# Patient Record
Sex: Female | Born: 2000 | Race: Black or African American | Hispanic: No | Marital: Single | Smoking: Never smoker
Health system: Southern US, Community
[De-identification: ages and names within clinical notes are randomized; demographics above are authoritative.]

## PROBLEM LIST (undated history)

## (undated) ENCOUNTER — Ambulatory Visit: Admission: EM

## (undated) DIAGNOSIS — IMO0002 Reserved for concepts with insufficient information to code with codable children: Secondary | ICD-10-CM

## (undated) DIAGNOSIS — I10 Essential (primary) hypertension: Secondary | ICD-10-CM

## (undated) DIAGNOSIS — R569 Unspecified convulsions: Secondary | ICD-10-CM

## (undated) DIAGNOSIS — M329 Systemic lupus erythematosus, unspecified: Secondary | ICD-10-CM

## (undated) DIAGNOSIS — M3214 Glomerular disease in systemic lupus erythematosus: Secondary | ICD-10-CM

---
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
## 2020-10-31 DIAGNOSIS — D638 Anemia in other chronic diseases classified elsewhere: Secondary | ICD-10-CM | POA: Diagnosis present

## 2020-10-31 DIAGNOSIS — I6783 Posterior reversible encephalopathy syndrome: Secondary | ICD-10-CM | POA: Insufficient documentation

## 2020-12-09 ENCOUNTER — Inpatient Hospital Stay (HOSPITAL_COMMUNITY)
Admit: 2020-12-09 | Discharge: 2020-12-09 | Disposition: A | Discharge status: Home / Self Care | Payer: Medicaid Other | Attending: Critical Care Medicine | Admitting: Critical Care Medicine

## 2020-12-09 ENCOUNTER — Emergency Department: Payer: Medicaid Other

## 2020-12-09 ENCOUNTER — Encounter: Payer: Self-pay | Admitting: Internal Medicine

## 2020-12-09 ENCOUNTER — Inpatient Hospital Stay
Admission: EM | Admit: 2020-12-09 | Discharge: 2020-12-10 | DRG: 291 | Disposition: A | Discharge status: Other Acute Inpatient Hospital | Payer: Medicaid Other | Attending: Internal Medicine | Admitting: Internal Medicine

## 2020-12-09 DIAGNOSIS — Z7952 Long term (current) use of systemic steroids: Secondary | ICD-10-CM | POA: Diagnosis not present

## 2020-12-09 DIAGNOSIS — I420 Dilated cardiomyopathy: Secondary | ICD-10-CM | POA: Diagnosis not present

## 2020-12-09 DIAGNOSIS — C801 Malignant (primary) neoplasm, unspecified: Secondary | ICD-10-CM | POA: Diagnosis not present

## 2020-12-09 DIAGNOSIS — N183 Chronic kidney disease, stage 3 unspecified: Secondary | ICD-10-CM | POA: Diagnosis present

## 2020-12-09 DIAGNOSIS — R0603 Acute respiratory distress: Secondary | ICD-10-CM | POA: Diagnosis not present

## 2020-12-09 DIAGNOSIS — J9602 Acute respiratory failure with hypercapnia: Secondary | ICD-10-CM | POA: Diagnosis present

## 2020-12-09 DIAGNOSIS — I5021 Acute systolic (congestive) heart failure: Secondary | ICD-10-CM | POA: Diagnosis not present

## 2020-12-09 DIAGNOSIS — J9601 Acute respiratory failure with hypoxia: Secondary | ICD-10-CM | POA: Diagnosis not present

## 2020-12-09 DIAGNOSIS — I130 Hypertensive heart and chronic kidney disease with heart failure and stage 1 through stage 4 chronic kidney disease, or unspecified chronic kidney disease: Principal | ICD-10-CM | POA: Diagnosis present

## 2020-12-09 DIAGNOSIS — R569 Unspecified convulsions: Secondary | ICD-10-CM | POA: Diagnosis present

## 2020-12-09 DIAGNOSIS — Z79899 Other long term (current) drug therapy: Secondary | ICD-10-CM

## 2020-12-09 DIAGNOSIS — I161 Hypertensive emergency: Secondary | ICD-10-CM | POA: Diagnosis not present

## 2020-12-09 DIAGNOSIS — Z20822 Contact with and (suspected) exposure to covid-19: Secondary | ICD-10-CM | POA: Diagnosis present

## 2020-12-09 DIAGNOSIS — J810 Acute pulmonary edema: Secondary | ICD-10-CM | POA: Diagnosis present

## 2020-12-09 DIAGNOSIS — Z4659 Encounter for fitting and adjustment of other gastrointestinal appliance and device: Secondary | ICD-10-CM

## 2020-12-09 DIAGNOSIS — M3214 Glomerular disease in systemic lupus erythematosus: Secondary | ICD-10-CM | POA: Diagnosis present

## 2020-12-09 DIAGNOSIS — I313 Pericardial effusion (noninflammatory): Secondary | ICD-10-CM | POA: Diagnosis present

## 2020-12-09 DIAGNOSIS — E872 Acidosis: Secondary | ICD-10-CM | POA: Diagnosis present

## 2020-12-09 DIAGNOSIS — D649 Anemia, unspecified: Secondary | ICD-10-CM | POA: Diagnosis not present

## 2020-12-09 HISTORY — DX: Glomerular disease in systemic lupus erythematosus: M32.14

## 2020-12-09 HISTORY — DX: Unspecified convulsions: R56.9

## 2020-12-09 HISTORY — DX: Essential (primary) hypertension: I10

## 2020-12-09 HISTORY — DX: Reserved for concepts with insufficient information to code with codable children: IMO0002

## 2020-12-09 HISTORY — DX: Systemic lupus erythematosus, unspecified: M32.9

## 2020-12-09 LAB — URINE DRUG SCREEN, QUALITATIVE (ARMC ONLY)
Amphetamines, Ur Screen: NOT DETECTED
Barbiturates, Ur Screen: NOT DETECTED
Benzodiazepine, Ur Scrn: NOT DETECTED
Cannabinoid 50 Ng, Ur ~~LOC~~: NOT DETECTED
Cocaine Metabolite,Ur ~~LOC~~: NOT DETECTED
MDMA (Ecstasy)Ur Screen: NOT DETECTED
Methadone Scn, Ur: NOT DETECTED
Opiate, Ur Screen: NOT DETECTED
Phencyclidine (PCP) Ur S: NOT DETECTED
Tricyclic, Ur Screen: NOT DETECTED

## 2020-12-09 LAB — CBC WITH DIFFERENTIAL/PLATELET
Abs Immature Granulocytes: 0.21 10*3/uL — ABNORMAL HIGH (ref 0.00–0.07)
Basophils Absolute: 0 10*3/uL (ref 0.0–0.1)
Basophils Relative: 0 %
Eosinophils Absolute: 0 10*3/uL (ref 0.0–0.5)
Eosinophils Relative: 0 %
HCT: 24.2 % — ABNORMAL LOW (ref 36.0–46.0)
Hemoglobin: 7.4 g/dL — ABNORMAL LOW (ref 12.0–15.0)
Immature Granulocytes: 2 %
Lymphocytes Relative: 7 %
Lymphs Abs: 0.9 10*3/uL (ref 0.7–4.0)
MCH: 28.4 pg (ref 26.0–34.0)
MCHC: 30.6 g/dL (ref 30.0–36.0)
MCV: 92.7 fL (ref 80.0–100.0)
Monocytes Absolute: 0.4 10*3/uL (ref 0.1–1.0)
Monocytes Relative: 3 %
Neutro Abs: 10.5 10*3/uL — ABNORMAL HIGH (ref 1.7–7.7)
Neutrophils Relative %: 88 %
Platelets: 303 10*3/uL (ref 150–400)
RBC: 2.61 MIL/uL — ABNORMAL LOW (ref 3.87–5.11)
RDW: 15.8 % — ABNORMAL HIGH (ref 11.5–15.5)
WBC: 12 10*3/uL — ABNORMAL HIGH (ref 4.0–10.5)
nRBC: 0 % (ref 0.0–0.2)

## 2020-12-09 LAB — BLOOD GAS, ARTERIAL
Acid-base deficit: 8.6 mmol/L — ABNORMAL HIGH (ref 0.0–2.0)
Bicarbonate: 17.4 mmol/L — ABNORMAL LOW (ref 20.0–28.0)
Delivery systems: POSITIVE
Expiratory PAP: 5
FIO2: 0.5
Inspiratory PAP: 10
Mechanical Rate: 10
O2 Saturation: 99 %
Patient temperature: 37
pCO2 arterial: 37 mmHg (ref 32.0–48.0)
pH, Arterial: 7.28 — ABNORMAL LOW (ref 7.350–7.450)
pO2, Arterial: 146 mmHg — ABNORMAL HIGH (ref 83.0–108.0)

## 2020-12-09 LAB — GLUCOSE, CAPILLARY
Glucose-Capillary: 88 mg/dL (ref 70–99)
Glucose-Capillary: 72 mg/dL (ref 70–99)
Glucose-Capillary: 87 mg/dL (ref 70–99)

## 2020-12-09 LAB — ECHOCARDIOGRAM COMPLETE
AR max vel: 3.61 cm2
AV Area VTI: 3.33 cm2
AV Area mean vel: 3.63 cm2
AV Mean grad: 3 mmHg
AV Peak grad: 5.9 mmHg
Ao pk vel: 1.21 m/s
Area-P 1/2: 8.82 cm2
Height: 66 in
S' Lateral: 3.8 cm
Weight: 2240 oz

## 2020-12-09 LAB — EXPECTORATED SPUTUM ASSESSMENT W GRAM STAIN, RFLX TO RESP C

## 2020-12-09 LAB — BASIC METABOLIC PANEL
Anion gap: 8 (ref 5–15)
BUN: 25 mg/dL — ABNORMAL HIGH (ref 6–20)
CO2: 18 mmol/L — ABNORMAL LOW (ref 22–32)
Calcium: 7.8 mg/dL — ABNORMAL LOW (ref 8.9–10.3)
Chloride: 115 mmol/L — ABNORMAL HIGH (ref 98–111)
Creatinine, Ser: 1.34 mg/dL — ABNORMAL HIGH (ref 0.44–1.00)
GFR, Estimated: 59 mL/min — ABNORMAL LOW (ref >60)
Glucose, Bld: 144 mg/dL — ABNORMAL HIGH (ref 70–99)
Potassium: 4.5 mmol/L (ref 3.5–5.1)
Sodium: 141 mmol/L (ref 135–145)

## 2020-12-09 LAB — HIV ANTIBODY (ROUTINE TESTING W REFLEX): HIV Screen 4th Generation wRfx: NONREACTIVE

## 2020-12-09 LAB — PROCALCITONIN: Procalcitonin: 3.01 ng/mL

## 2020-12-09 LAB — D-DIMER, QUANTITATIVE: D-Dimer, Quant: 0.28 ug/mL-FEU (ref 0.00–0.50)

## 2020-12-09 LAB — RESP PANEL BY RT-PCR (FLU A&B, COVID) ARPGX2
Influenza A by PCR: NEGATIVE
Influenza B by PCR: NEGATIVE
SARS Coronavirus 2 by RT PCR: NEGATIVE

## 2020-12-09 LAB — TROPONIN I (HIGH SENSITIVITY)
Troponin I (High Sensitivity): 27 ng/L — ABNORMAL HIGH (ref <18)
Troponin I (High Sensitivity): 121 ng/L (ref <18)

## 2020-12-09 LAB — HCG, QUANTITATIVE, PREGNANCY: hCG, Beta Chain, Quant, S: 1 m[IU]/mL (ref <5)

## 2020-12-09 LAB — PHOSPHORUS: Phosphorus: 6 mg/dL — ABNORMAL HIGH (ref 2.5–4.6)

## 2020-12-09 LAB — PROTIME-INR
INR: 1 (ref 0.8–1.2)
Prothrombin Time: 12.2 seconds (ref 11.4–15.2)

## 2020-12-09 LAB — BRAIN NATRIURETIC PEPTIDE: B Natriuretic Peptide: >4500 pg/mL — ABNORMAL HIGH (ref 0.0–100.0)

## 2020-12-09 LAB — TYPE AND SCREEN
ABO/RH(D): B POS
Antibody Screen: NEGATIVE

## 2020-12-09 LAB — MAGNESIUM: Magnesium: 1.9 mg/dL (ref 1.7–2.4)

## 2020-12-09 LAB — MRSA PCR SCREENING: MRSA by PCR: NEGATIVE

## 2020-12-09 LAB — TRIGLYCERIDES: Triglycerides: 318 mg/dL — ABNORMAL HIGH (ref <150)

## 2020-12-09 MED ORDER — FUROSEMIDE 10 MG/ML IJ SOLN
80.0000 mg | Freq: Once | INTRAMUSCULAR | Status: AC
  Administered 2020-12-09: 80 mg via INTRAVENOUS

## 2020-12-09 MED ORDER — ORAL CARE MOUTH RINSE
15.0000 mL | OROMUCOSAL | Status: DC
  Administered 2020-12-10 (×4): 15 mL via OROMUCOSAL

## 2020-12-09 MED ORDER — PROPOFOL 10 MG/ML IV BOLUS
INTRAVENOUS | Status: AC
  Filled 2020-12-09: qty 20

## 2020-12-09 MED ORDER — CHLORHEXIDINE GLUCONATE CLOTH 2 % EX PADS
6.0000 | MEDICATED_PAD | Freq: Every day | CUTANEOUS | Status: DC
  Administered 2020-12-09: 6 via TOPICAL

## 2020-12-09 MED ORDER — SODIUM CHLORIDE 0.9% FLUSH: 10.0000 mL | INTRAVENOUS | Status: DC | PRN

## 2020-12-09 MED ORDER — ONDANSETRON HCL 4 MG/2ML IJ SOLN: 4.0000 mg | Freq: Once | INTRAMUSCULAR | Status: DC

## 2020-12-09 MED ORDER — POLYETHYLENE GLYCOL 3350 17 G PO PACK: 17.0000 g | PACK | Freq: Every day | ORAL | Status: DC | PRN

## 2020-12-09 MED ORDER — ONDANSETRON HCL 4 MG/2ML IJ SOLN
INTRAMUSCULAR | Status: AC
  Filled 2020-12-09: qty 2

## 2020-12-09 MED ORDER — PROPOFOL 1000 MG/100ML IV EMUL
INTRAVENOUS | Status: AC | PRN
  Administered 2020-12-09: 10 ug/kg/min via INTRAVENOUS

## 2020-12-09 MED ORDER — DOCUSATE SODIUM 100 MG PO CAPS: 100.0000 mg | ORAL_CAPSULE | Freq: Two times a day (BID) | ORAL | Status: DC | PRN

## 2020-12-09 MED ORDER — LORAZEPAM 2 MG/ML IJ SOLN
2.0000 mg | INTRAMUSCULAR | Status: DC | PRN
  Administered 2020-12-09 – 2020-12-10 (×2): 4 mg via INTRAVENOUS
  Administered 2020-12-10: 0.5 mg via INTRAVENOUS
  Filled 2020-12-09: qty 2
  Filled 2020-12-09: qty 1
  Filled 2020-12-09 (×2): qty 2

## 2020-12-09 MED ORDER — PROPOFOL 10 MG/ML IV BOLUS
INTRAVENOUS | Status: AC | PRN
  Administered 2020-12-09 (×2): 60 mg via INTRAVENOUS
  Administered 2020-12-09: 40 mg via INTRAVENOUS

## 2020-12-09 MED ORDER — FENTANYL CITRATE (PF) 100 MCG/2ML IJ SOLN: 50.0000 ug | Freq: Once | INTRAMUSCULAR | Status: AC

## 2020-12-09 MED ORDER — VITAL HIGH PROTEIN PO LIQD
1000.0000 mL | ORAL | Status: DC
  Administered 2020-12-09: 1000 mL

## 2020-12-09 MED ORDER — ETOMIDATE 2 MG/ML IV SOLN
INTRAVENOUS | Status: AC | PRN
  Administered 2020-12-09: 20 mg via INTRAVENOUS

## 2020-12-09 MED ORDER — SUCCINYLCHOLINE CHLORIDE 20 MG/ML IJ SOLN
INTRAMUSCULAR | Status: AC | PRN
  Administered 2020-12-09: 100 mg via INTRAVENOUS

## 2020-12-09 MED ORDER — FREE WATER
30.0000 mL | Status: DC
  Administered 2020-12-09 – 2020-12-10 (×6): 30 mL

## 2020-12-09 MED ORDER — HEPARIN SODIUM (PORCINE) 5000 UNIT/ML IJ SOLN
5000.0000 [IU] | Freq: Three times a day (TID) | INTRAMUSCULAR | Status: DC
  Administered 2020-12-09 – 2020-12-10 (×4): 5000 [IU] via SUBCUTANEOUS
  Filled 2020-12-09 (×4): qty 1

## 2020-12-09 MED ORDER — FUROSEMIDE 10 MG/ML IJ SOLN
INTRAMUSCULAR | Status: AC
  Filled 2020-12-09: qty 8

## 2020-12-09 MED ORDER — HYDRALAZINE HCL 20 MG/ML IJ SOLN
INTRAMUSCULAR | Status: AC
  Administered 2020-12-09: 5 mg via INTRAVENOUS
  Filled 2020-12-09: qty 1

## 2020-12-09 MED ORDER — PROPOFOL 1000 MG/100ML IV EMUL
5.0000 ug/kg/min | INTRAVENOUS | Status: DC
  Administered 2020-12-09 (×2): 80 ug/kg/min via INTRAVENOUS
  Administered 2020-12-09: 55 ug/kg/min via INTRAVENOUS
  Administered 2020-12-09: 45 ug/kg/min via INTRAVENOUS
  Administered 2020-12-10: 40 ug/kg/min via INTRAVENOUS
  Administered 2020-12-10: 45 ug/kg/min via INTRAVENOUS
  Filled 2020-12-09 (×6): qty 100

## 2020-12-09 MED ORDER — HYDRALAZINE HCL 20 MG/ML IJ SOLN: 5.0000 mg | Freq: Once | INTRAMUSCULAR | Status: AC

## 2020-12-09 MED ORDER — PROPOFOL 1000 MG/100ML IV EMUL
INTRAVENOUS | Status: AC
  Filled 2020-12-09: qty 100

## 2020-12-09 MED ORDER — NICARDIPINE HCL IN NACL 20-0.86 MG/200ML-% IV SOLN
3.0000 mg/h | INTRAVENOUS | Status: DC
  Administered 2020-12-09: 5 mg/h via INTRAVENOUS
  Filled 2020-12-09: qty 200

## 2020-12-09 MED ORDER — PANTOPRAZOLE SODIUM 40 MG PO PACK
40.0000 mg | PACK | Freq: Every day | ORAL | Status: DC
  Administered 2020-12-09 – 2020-12-10 (×2): 40 mg
  Filled 2020-12-09 (×2): qty 20

## 2020-12-09 MED ORDER — ONDANSETRON HCL 4 MG/2ML IJ SOLN
4.0000 mg | Freq: Four times a day (QID) | INTRAMUSCULAR | Status: DC | PRN
  Administered 2020-12-10: 4 mg via INTRAVENOUS
  Filled 2020-12-09: qty 2

## 2020-12-09 MED ORDER — FENTANYL BOLUS VIA INFUSION: 200.0000 ug | Freq: Once | INTRAVENOUS | Status: AC

## 2020-12-09 MED ORDER — CHLORHEXIDINE GLUCONATE 0.12% ORAL RINSE (MEDLINE KIT)
15.0000 mL | Freq: Two times a day (BID) | OROMUCOSAL | Status: DC
  Administered 2020-12-09 – 2020-12-10 (×3): 15 mL via OROMUCOSAL

## 2020-12-09 MED ORDER — FENTANYL 2500MCG IN NS 250ML (10MCG/ML) PREMIX INFUSION
INTRAVENOUS | Status: AC
  Administered 2020-12-09: 200 ug via INTRAVENOUS
  Filled 2020-12-09: qty 250

## 2020-12-09 MED ORDER — ACETAMINOPHEN 325 MG PO TABS
650.0000 mg | ORAL_TABLET | ORAL | Status: DC | PRN
  Administered 2020-12-09: 650 mg via ORAL
  Filled 2020-12-09: qty 2

## 2020-12-09 MED ORDER — PREDNISONE 20 MG PO TABS
20.0000 mg | ORAL_TABLET | Freq: Two times a day (BID) | ORAL | Status: DC
  Administered 2020-12-09 – 2020-12-10 (×2): 20 mg
  Filled 2020-12-09 (×2): qty 1

## 2020-12-09 MED ORDER — PROSOURCE TF PO LIQD
45.0000 mL | Freq: Four times a day (QID) | ORAL | Status: DC
  Administered 2020-12-09 – 2020-12-10 (×4): 45 mL
  Filled 2020-12-09: qty 45

## 2020-12-09 MED ORDER — FUROSEMIDE 10 MG/ML IJ SOLN: 40.0000 mg | Freq: Once | INTRAMUSCULAR | Status: DC

## 2020-12-09 MED ORDER — SODIUM CHLORIDE 0.9% FLUSH
10.0000 mL | Freq: Two times a day (BID) | INTRAVENOUS | Status: DC
  Administered 2020-12-09 – 2020-12-10 (×2): 10 mL

## 2020-12-09 MED ORDER — FENTANYL CITRATE (PF) 100 MCG/2ML IJ SOLN
INTRAMUSCULAR | Status: AC
  Administered 2020-12-09: 100 ug via INTRAVENOUS
  Filled 2020-12-09: qty 2

## 2020-12-09 MED ORDER — FENTANYL 2500MCG IN NS 250ML (10MCG/ML) PREMIX INFUSION
0.0000 ug/h | INTRAVENOUS | Status: DC
  Administered 2020-12-09: 25 ug/h via INTRAVENOUS
  Filled 2020-12-09: qty 250

## 2020-12-09 MED ORDER — ADULT MULTIVITAMIN LIQUID CH
15.0000 mL | Freq: Every day | ORAL | Status: DC
  Administered 2020-12-10: 15 mL
  Filled 2020-12-09: qty 15

## 2020-12-09 MED ORDER — LEVETIRACETAM 100 MG/ML PO SOLN
1000.0000 mg | Freq: Two times a day (BID) | ORAL | Status: DC
  Administered 2020-12-09 – 2020-12-10 (×3): 1000 mg
  Filled 2020-12-09 (×4): qty 10

## 2020-12-09 NOTE — Consult Note (Signed)
[facility] Consult Note:    Date of Admission:  12/09/2020           Reason for Consult: Lupus Nephritis    Referring Provider: Flora Lipps, MD Primary Care Provider: Hill, [place]   History of Presenting Illness:  Melinda Steele is a 20 y.o. 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  She then presented in status epilepticus secondary to press on January 26.  She was also diagnosed with Covid while hospitalized and received remdesivir.  third dose of Cytoxan on February 8. In addition she was given blood transfusion in early March. She has received rituximab with final 2 infusions of planned EuroLupus protocol.  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  Initially presented with lymphopenia, ANA positive, double-stranded DNA significantly positive, Smith and RNP antibodies positive.  Elevated inflammatory markers and hypocomplementemia.  Antiphospholipid antibody, RF and CCP antibodies negative.  Hospitalized in December 2017 with pericardial effusion.  Also has history of GI vasculitis  Plan in January 2020 with polyarthritis.  Patient was hospitalized and treated with IV Solu-Medrol, Benlysta.  CellCept discontinued and Imuran started. Most recently patient had flare in January 2022 requiring hospitalization with pericardial effusion, cardiac tamponade requiring pericardiocentesis and drain placement, AKI, cytopenia, arthritis rash and right axillary mass-suppurative lymphadenopathy  According to patient's mother, she received 5 out of 6 infusion of Cytoxan along with rituximab this past Monday. At Home she started to have  difficulty breathing therefore she brought her to the emergency room for evaluation  Review of Systems: ROS  Not available due to critical illness.  Patient is intubated and sedated  Past Medical History:  Diagnosis Date  . Hypertension   . Lupus ([place])   . Lupus nephritis ([place])   . Seizure [facility])     Social History   Tobacco Use  . Smoking status: Never Smoker  . Smokeless tobacco: Never Used  Substance Use Topics  . Alcohol use: Never  . Drug use: Never    History reviewed. No pertinent family history.   OBJECTIVE: Blood pressure (!) 98/52, pulse (!) 103, temperature (!) 96.44 F (35.8 C), resp. rate (!) 21, height 5\' 6"  (1.676 m), weight 63.5 kg, SpO2 99 %.  Physical Exam   Physical Exam: General:  No acute distress, laying in the bed  HEENT  anicteric, pale conjunctiva, ET tube in place  Pulm/lungs  ventilator assisted, lungs are clear to auscultation  CVS/Heart  regular rhythm, no rub or gallop  Abdomen:   Soft, nontender  Extremities:  No peripheral edema  Neurologic:  Sedated  Skin:  No acute rashes  Foley catheter in place   Lab Results Lab Results  Component Value Date   WBC 12.0 (H) 12/09/2020   HGB 7.4 (L) 12/09/2020   HCT 24.2 (L) 12/09/2020   MCV 92.7 12/09/2020   PLT 303 12/09/2020    Lab Results  Component Value Date   CREATININE 1.34 (H) 12/09/2020   BUN 25 (H) 12/09/2020   NA 141 12/09/2020   K 4.5 12/09/2020   CL 115 (H) 12/09/2020   CO2 18 (L) 12/09/2020   No results found for: ALT, AST, GGT, ALKPHOS, BILITOT   Microbiology: Recent  Results (from the past 240 hour(s))  Resp Panel by RT-PCR (Flu A&B, Covid) Nasopharyngeal Swab     Status: None   Collection Time: 12/09/20  5:27 AM   Specimen: Nasopharyngeal Swab; Nasopharyngeal(NP) swabs in vial transport medium  Result Value Ref Range Status   SARS Coronavirus 2 by RT PCR NEGATIVE NEGATIVE Final    Comment: (NOTE) SARS-CoV-2 target nucleic acids are NOT DETECTED.  The SARS-CoV-2  RNA is generally detectable in upper respiratory specimens during the acute phase of infection. The lowest concentration of SARS-CoV-2 viral copies this assay can detect is 138 copies/mL. A negative result does not preclude SARS-Cov-2 infection and should not be used as the sole basis for treatment or other patient management decisions. A negative result may occur with  improper specimen collection/handling, submission of specimen other than nasopharyngeal swab, presence of viral mutation(s) within the areas targeted by this assay, and inadequate number of viral copies(<138 copies/mL). A negative result must be combined with clinical observations, patient history, and epidemiological information. The expected result is Negative.  Fact Sheet for Patients:  EntrepreneurPulse.com.au  Fact Sheet for Healthcare Providers:  IncredibleEmployment.be  This test is no t yet approved or cleared by the [place] FDA and  has been authorized for detection and/or diagnosis of SARS-CoV-2 by FDA under an Emergency Use Authorization (EUA). This EUA will remain  in effect (meaning this test can be used) for the duration of the COVID-19 declaration under Section 564(b)(1) of the Act, 21 U.S.C.section 360bbb-3(b)(1), unless the authorization is terminated  or revoked sooner.       Influenza A by PCR NEGATIVE NEGATIVE Final   Influenza B by PCR NEGATIVE NEGATIVE Final    Comment: (NOTE) The Xpert Xpress SARS-CoV-2/FLU/RSV plus assay is intended as an aid in the diagnosis of influenza from Nasopharyngeal swab specimens and should not be used as a sole basis for treatment. Nasal washings and aspirates are unacceptable for Xpert Xpress SARS-CoV-2/FLU/RSV testing.  Fact Sheet for Patients: EntrepreneurPulse.com.au  Fact Sheet for Healthcare Providers: IncredibleEmployment.be  This test is not yet approved or cleared by the  [place] FDA and has been authorized for detection and/or diagnosis of SARS-CoV-2 by FDA under an Emergency Use Authorization (EUA). This EUA will remain in effect (meaning this test can be used) for the duration of the COVID-19 declaration under Section 564(b)(1) of the Act, 21 U.S.C. section 360bbb-3(b)(1), unless the authorization is terminated or revoked.  Performed at [facility], [address] [place], [place] [address]     Medications: Scheduled Meds: . heparin  5,000 Units Subcutaneous Q8H  . levETIRAcetam  1,000 mg Per Tube BID  . ondansetron (ZOFRAN) IV  4 mg Intravenous Once  . predniSONE  20 mg Per Tube BID WC   Continuous Infusions: . fentaNYL infusion INTRAVENOUS 150 mcg/hr (12/09/20 1000)  . niCARDipine 4 mg/hr (12/09/20 1000)  . propofol (DIPRIVAN) infusion 80 mcg/kg/min (12/09/20 1000)   PRN Meds:.acetaminophen, docusate sodium, LORazepam, ondansetron (ZOFRAN) IV, polyethylene glycol  Allergies  Allergen Reactions  . Amlodipine Swelling    Urinalysis: No results for input(s): COLORURINE, LABSPEC, PHURINE, GLUCOSEU, HGBUR, BILIRUBINUR, KETONESUR, PROTEINUR, UROBILINOGEN, NITRITE, LEUKOCYTESUR in the last 72 hours.  Invalid input(s): APPERANCEUR    Imaging: DG Abd 1 View  Result Date: 12/09/2020 CLINICAL DATA:  Intubation. And does tracheal and orogastric tube placement. EXAM: ABDOMEN - 1 VIEW; PORTABLE CHEST - 1 VIEW CHEST - 1  VIEW AP SEMI_ERECT COMPARISON:  Chest x-ray 12/09/2020 5:35 a.m. FINDINGS: Endotracheal  tube with tip terminating 4.5 cm above the carina. Enteric tube coursing below the hemidiaphragm with tip and side port overlying the expected region of the gastric lumen. Enlarged cardiac silhouette. The heart size and mediastinal contours are otherwise unremarkable. Persistent diffuse patchy interstitial and airspace opacities. No pulmonary edema. Trace left pleural effusion. No pneumothorax. No acute osseous abnormality. Paucity  of gas within the visualized upper abdomen. No radio-opaque calculi or other significant radiographic abnormality are seen. IMPRESSION: 1. Endotracheal and enteric tubes in appropriate position. 2. Persistent diffuse interstitial and airspace opacities. 3. Trace left pleural effusion. 4. Cardiomegaly. 5. Paucity of gas within the visualized upper abdomen. Electronically Signed   By: Iven Finn M.D.   On: 12/09/2020 06:46   DG Chest Portable 1 View  Result Date: 12/09/2020 CLINICAL DATA:  Intubation. And does tracheal and orogastric tube placement. EXAM: ABDOMEN - 1 VIEW; PORTABLE CHEST - 1 VIEW CHEST - 1  VIEW AP SEMI_ERECT COMPARISON:  Chest x-ray 12/09/2020 5:35 a.m. FINDINGS: Endotracheal tube with tip terminating 4.5 cm above the carina. Enteric tube coursing below the hemidiaphragm with tip and side port overlying the expected region of the gastric lumen. Enlarged cardiac silhouette. The heart size and mediastinal contours are otherwise unremarkable. Persistent diffuse patchy interstitial and airspace opacities. No pulmonary edema. Trace left pleural effusion. No pneumothorax. No acute osseous abnormality. Paucity of gas within the visualized upper abdomen. No radio-opaque calculi or other significant radiographic abnormality are seen. IMPRESSION: 1. Endotracheal and enteric tubes in appropriate position. 2. Persistent diffuse interstitial and airspace opacities. 3. Trace left pleural effusion. 4. Cardiomegaly. 5. Paucity of gas within the visualized upper abdomen. Electronically Signed   By: Iven Finn M.D.   On: 12/09/2020 06:46   DG Chest Portable 1 View  Result Date: 12/09/2020 CLINICAL DATA:  Shortness of breath. EXAM: PORTABLE CHEST 1 VIEW COMPARISON:  None. FINDINGS: Enlarged cardiac silhouette the heart size and mediastinal contours are otherwise unremarkable. Diffuse interstitial and airspace opacities. No pulmonary edema. No pleural effusion. No pneumothorax. No acute osseous  abnormality. IMPRESSION: Cardiomegaly with diffuse interstitial and airspace opacities. Electronically Signed   By: Iven Finn M.D.   On: 12/09/2020 05:35      Assessment/Plan:  Nacole Fluhr is a 20 y.o. female with medical problems of SLE, lupus nephritis, hypertension, multiple complications of lupus, status epilepticus, press syndrome, polyarthritis, pericardial effusion was admitted on 12/09/2020 for :  Acute pulmonary edema (HCC) [J81.0] Respiratory distress [R06.03] Encounter for orogastric (OG) tube placement [Z46.59] Acute respiratory failure with hypoxia ([place]) [J96.01] Hypertensive emergency [S28.3]  #Complicated SLE with Class III lupus nephritis (Dx 07/2016) Diagnosed in November 2017.  Patient has had multiple complications and hospitalizations related to lupus as documented in care everywhere records. Most recently patient is being treated with cyclophosphamide and rituximab IV.  Last infusion was given this past Monday. In addition she is getting prednisone and Plaquenil Serum creatinine is at baseline -We will continue to monitor creatinine -We will obtain complements and urine protein to creatinine ratio  # Acute respiratory failure Continue supportive care Patient received one-time dose of furosemide yesterday  #Severe hypertension Currently on IV nicardipine drip which is being weaned off -Avoid Hypotension  Case discussed with patient's mother who is at the bedside    Harmeet Singh 12/09/20

## 2020-12-09 NOTE — ED Provider Notes (Signed)
[facility] Emergency Department Provider Note  ____________________________________________   Event Date/Time   First MD Initiated Contact with Patient 12/09/20 [phone]     (approximate)  I have reviewed the triage vital signs and the nursing notes.   HISTORY  Chief Complaint Respiratory Distress    HPI My Rinke is a 20 y.o. female with history of lupus nephritis, hypertension, seizures on Keppra who presents to the emergency department with complaints of respiratory distress.  Patient arrives with EMS.  Patient unable to provide much history due to her respiratory distress.  Mother at bedside states that patient just had a prolonged hospitalization at [facility].  On review of her records it appears she was admitted 10/03/2020 -10/09/2020 for recurrence of pericardial effusion status post pericardiocentesis x2 and lupus flare.  She was then admitted again from 10/21/2020 -11/12/2020 for PRES, symptomatic edema, and was Covid positive.  Mother reports the patient is on Cytoxan and rituximab infusions.  She last received infusions on 12/07/2020.  Patient is on Plaquenil and prednisone daily.  Mother reports she is also on multiple blood pressure medications including clonidine patch, chlorthalidone, lisinopril, nifedipine, carvedilol.  They report that she has been compliant with these medications.  I appears according to her last pediatric nephrology note on March 14, her torsemide was held and mother states she was given IV fluids during her infusion due to concerns for being volume depleted.  Mother denies that the patient has had any recent fevers, cough.  She states that she was doing well yesterday.  She reports that she woke up in the middle of the night calling out for help.  EMS reports the patient was tachypneic, tachycardic and hypertensive.  Sats in the low 90s on room air.  Placed on 6 L nasal cannula by EMS.        History reviewed. No pertinent past  medical history.  Patient Active Problem List   Diagnosis Date Noted  . Acute respiratory failure with hypoxia ([place]) 12/09/2020  . Systemic lupus erythematosus ([place]) 09/18/2019    History reviewed. No pertinent surgical history.  Prior to Admission medications   Medication Sig Start Date End Date Taking? Authorizing Provider  acetaminophen (TYLENOL) 500 MG tablet Take 500 mg by mouth every 6 (six) hours as needed. 11/11/20 11/11/21 Yes [provider]  ascorbic acid (VITAMIN C) 500 MG tablet Take 500 mg by mouth daily.   Yes [provider]  calcium carbonate (OSCAL) 1500 (600 Ca) MG TABS tablet Take 1,500 mg by mouth daily with breakfast.   Yes [provider]  carvedilol (COREG) 25 MG tablet Take 25 mg by mouth 2 (two) times daily. 11/12/20  Yes [provider]  chlorthalidone (HYGROTON) 25 MG tablet Take 25 mg by mouth 2 (two) times daily. 12/07/20  Yes [provider]  cloNIDine (CATAPRES - DOSED IN MG/24 HR) 0.3 mg/24hr patch Place 0.3 mg onto the skin once a week. 11/17/20  Yes [provider]  ferrous sulfate 325 (65 FE) MG tablet Take 1 tablet by mouth daily. 11/12/20 11/12/21 Yes [provider]  hydroxychloroquine (PLAQUENIL) 200 MG tablet Take 400 mg by mouth daily.   Yes [provider]  levETIRAcetam (KEPPRA) 1000 MG tablet Take 1,000 mg by mouth 2 (two) times daily. 11/12/20  Yes [provider]  lisinopril (ZESTRIL) 5 MG tablet Take 5 mg by mouth at bedtime. 11/12/20  Yes [provider]  magnesium oxide (MAG-OX) 400 MG tablet Take 400 mg by  mouth daily. 11/12/20 11/12/21 Yes [provider]  melatonin 3 MG TABS tablet Take 3 mg by mouth at bedtime. 11/11/20 11/11/21 Yes [provider]  NIFEdipine (ADALAT CC) 30 MG 24 hr tablet Take 30 mg by mouth daily. 11/12/20  Yes [provider]  NIFEdipine (PROCARDIA-XL/NIFEDICAL-XL) 30 MG 24 hr tablet Take 1 tablet by mouth every 8  (eight) hours as needed. 11/12/20 11/12/21 Yes [provider]  pantoprazole (PROTONIX) 40 MG tablet Take 40 mg by mouth daily. 11/12/20  Yes [provider]  predniSONE (DELTASONE) 20 MG tablet Take 20 mg by mouth 2 (two) times daily. 12/07/20  Yes [provider]  riboflavin (VITAMIN B-2) 100 MG TABS tablet Take 100 mg by mouth daily. 11/12/20 11/12/21 Yes [provider]  sodium bicarbonate 650 MG tablet Take 650 mg by mouth 2 (two) times daily. 11/12/20  Yes [provider]  torsemide (DEMADEX) 10 MG tablet Take 30 mg by mouth daily. 11/12/20  Yes [provider]  triamcinolone (KENALOG) 0.1 % Apply topically 2 (two) times daily. 11/13/20  Yes [provider]    Allergies Amlodipine  History reviewed. No pertinent family history.  Social History Social History   Tobacco Use  . Smoking status: Never Smoker  . Smokeless tobacco: Never Used  Substance Use Topics  . Alcohol use: Never  . Drug use: Never    Review of Systems Level 5 caveat secondary to respiratory distress  ____________________________________________   PHYSICAL EXAM:  VITAL SIGNS: ED Triage Vitals  Enc Vitals Group     BP 12/09/20 0524 (!) 181/127     Pulse Rate 12/09/20 0524 (!) 129     Resp 12/09/20 0530 (!) 32     Temp 12/09/20 0524 (!) 97.4 F (36.3 C)     Temp Source 12/09/20 0524 Oral     SpO2 12/09/20 0524 99 %     Weight 12/09/20 0525 140 lb (63.5 kg)     Height 12/09/20 0525 5\' 3"  (1.6 m)     Head Circumference --      Peak Flow --      Pain Score --      Pain Loc --      Pain Edu? --      Excl. in [place]? --    CONSTITUTIONAL: Alert but unable to answer many questions due to respiratory distress.  Thin.  Afebrile. HEAD: Normocephalic EYES: Conjunctivae clear, pupils appear equal, EOM appear intact ENT: normal nose; moist mucous membranes NECK: Supple, normal ROM CARD: Regular and tachycardic; S1 and S2 appreciated; no murmurs, no  clicks, no rubs, no gallops RESP: Patient is extremely tachypneic with respiratory rate in the 60s.  Sats are 100% on 6 L nasal cannula.  Diffuse rhonchorous breath sounds.  Slightly diminished at bases bilaterally.  No wheezing or rales appreciated.  Patient only able to speak 1-2 words at a time. ABD/GI: Normal bowel sounds; non-distended; soft, non-tender, no rebound, no guarding, no peritoneal signs, no hepatosplenomegaly BACK: The back appears normal EXT: Normal ROM in all joints; no deformity noted, no edema; no cyanosis, no calf tenderness or calf swelling SKIN: Normal color for age and race; warm; no rash on exposed skin NEURO: Moves all extremities equally PSYCH: Appears very anxious.  ____________________________________________   LABS (all labs ordered are listed, but only abnormal results are displayed)  Labs Reviewed  CBC WITH DIFFERENTIAL/PLATELET - Abnormal; Notable for the following components:      Result Value   WBC  12.0 (*)    RBC 2.61 (*)    Hemoglobin 7.4 (*)    HCT 24.2 (*)    RDW 15.8 (*)    Neutro Abs 10.5 (*)    Abs Immature Granulocytes 0.21 (*)    All other components within normal limits  BASIC METABOLIC PANEL - Abnormal; Notable for the following components:   Chloride 115 (*)    CO2 18 (*)    Glucose, Bld 144 (*)    BUN 25 (*)    Creatinine, Ser 1.34 (*)    Calcium 7.8 (*)    GFR, Estimated 59 (*)    All other components within normal limits  BRAIN NATRIURETIC PEPTIDE - Abnormal; Notable for the following components:   B Natriuretic Peptide >4,500.0 (*)    All other components within normal limits  BLOOD GAS, ARTERIAL - Abnormal; Notable for the following components:   pH, Arterial 7.28 (*)    pO2, Arterial 146 (*)    Bicarbonate 17.4 (*)    Acid-base deficit 8.6 (*)    All other components within normal limits  TROPONIN I (HIGH SENSITIVITY) - Abnormal; Notable for the following components:   Troponin I (High Sensitivity) 27 (*)    All other  components within normal limits  RESP PANEL BY RT-PCR (FLU A&B, COVID) ARPGX2  HCG, QUANTITATIVE, PREGNANCY  D-DIMER, QUANTITATIVE (NOT AT [facility])  PROTIME-INR  HIV ANTIBODY (ROUTINE TESTING W REFLEX)  TYPE AND SCREEN  TROPONIN I (HIGH SENSITIVITY)   ____________________________________________  EKG   EKG Interpretation  Date/Time:  Wednesday December 09 2020 05:41:46 EDT Ventricular Rate:  133 PR Interval:    QRS Duration: 95 QT Interval:  331 QTC Calculation: 493 R Axis:   78 Text Interpretation: Sinus tachycardia RSR' in V1 or V2, right VCD or RVH Borderline T abnormalities, lateral leads Borderline prolonged QT interval Confirmed by Pryor Curia [phone]) on 12/09/2020 6:55:20 AM         ____________________________________________  RADIOLOGY Jessie Foot Auburn Hester, personally viewed and evaluated these images (plain radiographs) as part of my medical decision making, as well as reviewing the written report by the radiologist.  ED MD interpretation: Diffuse pulmonary edema seen on chest x-ray.  Official radiology report(s): DG Abd 1 View  Result Date: 12/09/2020 CLINICAL DATA:  Intubation. And does tracheal and orogastric tube placement. EXAM: ABDOMEN - 1 VIEW; PORTABLE CHEST - 1 VIEW CHEST - 1  VIEW AP SEMI_ERECT COMPARISON:  Chest x-ray 12/09/2020 5:35 a.m. FINDINGS: Endotracheal tube with tip terminating 4.5 cm above the carina. Enteric tube coursing below the hemidiaphragm with tip and side port overlying the expected region of the gastric lumen. Enlarged cardiac silhouette. The heart size and mediastinal contours are otherwise unremarkable. Persistent diffuse patchy interstitial and airspace opacities. No pulmonary edema. Trace left pleural effusion. No pneumothorax. No acute osseous abnormality. Paucity of gas within the visualized upper abdomen. No radio-opaque calculi or other significant radiographic abnormality are seen. IMPRESSION: 1. Endotracheal and enteric tubes in  appropriate position. 2. Persistent diffuse interstitial and airspace opacities. 3. Trace left pleural effusion. 4. Cardiomegaly. 5. Paucity of gas within the visualized upper abdomen. Electronically Signed   By: Iven Finn M.D.   On: 12/09/2020 06:46   DG Chest Portable 1 View  Result Date: 12/09/2020 CLINICAL DATA:  Intubation. And does tracheal and orogastric tube placement. EXAM: ABDOMEN - 1 VIEW; PORTABLE CHEST - 1 VIEW CHEST - 1  VIEW AP SEMI_ERECT COMPARISON:  Chest x-ray 12/09/2020 5:35 a.m. FINDINGS: Endotracheal tube  with tip terminating 4.5 cm above the carina. Enteric tube coursing below the hemidiaphragm with tip and side port overlying the expected region of the gastric lumen. Enlarged cardiac silhouette. The heart size and mediastinal contours are otherwise unremarkable. Persistent diffuse patchy interstitial and airspace opacities. No pulmonary edema. Trace left pleural effusion. No pneumothorax. No acute osseous abnormality. Paucity of gas within the visualized upper abdomen. No radio-opaque calculi or other significant radiographic abnormality are seen. IMPRESSION: 1. Endotracheal and enteric tubes in appropriate position. 2. Persistent diffuse interstitial and airspace opacities. 3. Trace left pleural effusion. 4. Cardiomegaly. 5. Paucity of gas within the visualized upper abdomen. Electronically Signed   By: Iven Finn M.D.   On: 12/09/2020 06:46   DG Chest Portable 1 View  Result Date: 12/09/2020 CLINICAL DATA:  Shortness of breath. EXAM: PORTABLE CHEST 1 VIEW COMPARISON:  None. FINDINGS: Enlarged cardiac silhouette the heart size and mediastinal contours are otherwise unremarkable. Diffuse interstitial and airspace opacities. No pulmonary edema. No pleural effusion. No pneumothorax. No acute osseous abnormality. IMPRESSION: Cardiomegaly with diffuse interstitial and airspace opacities. Electronically Signed   By: Iven Finn M.D.   On: 12/09/2020 05:35     ____________________________________________   PROCEDURES  Procedure(s) performed (including Critical Care):  Procedures  CRITICAL CARE Performed by: Cyril Mourning Abigail Marsiglia   Total critical care time: 65 minutes  Critical care time was exclusive of separately billable procedures and treating other patients.  Critical care was necessary to treat or prevent imminent or life-threatening deterioration.  Critical care was time spent personally by me on the following activities: development of treatment plan with patient and/or surrogate as well as nursing, discussions with consultants, evaluation of patient's response to treatment, examination of patient, obtaining history from patient or surrogate, ordering and performing treatments and interventions, ordering and review of laboratory studies, ordering and review of radiographic studies, pulse oximetry and re-evaluation of patient's condition.  ____________________________________________   INITIAL IMPRESSION / ASSESSMENT AND PLAN / ED COURSE  As part of my medical decision making, I reviewed the following data within the [place] History obtained from family, Nursing notes reviewed and incorporated, Labs reviewed , EKG interpreted , Old EKG reviewed, Old chart reviewed, A consult was requested and obtained from this/these consultant(s) Critical care and Notes from prior ED visits         Patient here with severe respiratory distress.  Bedside chest x-ray shows diffuse pulmonary edema.  She tells me that she has no known history of CHF.  She denies history of asthma or COPD.  Mother at bedside states that symptoms started suddenly tonight.  She does have a significant history of lupus nephritis and hypertension on multiple different medications.  She is extremely hypertensive and tachycardic here.  Differential includes hypertensive emergency, CHF exacerbation, pneumonia, COVID-19, PE.  Will obtain labs, start patient on  BiPAP and obtain an ABG.  She is afebrile at this time.  Mother denies any recent infectious symptoms.  She was recently positive for COVID-19 during recent admission at [facility] about a month ago.  Mother reports that her care is mostly at [facility].  She is hypertensive here.  Will give IV hydralazine bolus.  ED PROGRESS  Patient not significantly improving on BiPAP.  She has such significant increased work of breathing and I worry that she will tire out soon.  Talk to mother and patient and they agree with intubation.  Intubated using etomidate, succinylcholine without difficulty.  Patient requiring significant sedation.  Using  propofol and fentanyl.  Mother reports that she was admitted at [facility] and intubated for seizures and required multiple infusions at that time as well to keep her sedate.   Labs show minimally elevated troponin likely from demand.  Her BNP is greater than 4500.  Will give 80 mg of IV Lasix.  She continues to be hypertensive despite multiple boluses with sedation medications.  Will start on Cardene infusion.  No improvement with bolus of hydralazine.  Patient's D-dimer is negative.  ABG shows metabolic acidosis.    Hemoglobin is 7.4.  It was 7.1 on 12/07/2020.  Creatinine is 1.34.  Was 1.43 on 12/07/2020.  Bedside ultrasound shows concentric LVH, good cardiac contractility, small pericardial effusion, no signs of tamponade.  6:30 AM  D/w Marda Stalker, NP with CCM for admission.  At this time I feel she can be medically managed here at [facility] for blood pressure control and diuresis.  I do not feel she needs to be transferred emergently to [facility] and at this time I feel she is too unstable for transport.  7:00 AM  Pt's blood pressure has since improved significantly on Cardene.  I reviewed all nursing notes and pertinent previous records as available.  I have reviewed and interpreted any EKGs, lab and urine results, imaging (as  available).  ____________________________________________   FINAL CLINICAL IMPRESSION(S) / ED DIAGNOSES  Final diagnoses:  Hypertensive emergency  Acute pulmonary edema ([place])  Respiratory distress     ED Discharge Orders    None      *Please note:  Melinda Steele was evaluated in Emergency Department on 12/09/2020 for the symptoms described in the history of present illness. She was evaluated in the context of the global COVID-19 pandemic, which necessitated consideration that the patient might be at risk for infection with the SARS-CoV-2 virus that causes COVID-19. Institutional protocols and algorithms that pertain to the evaluation of patients at risk for COVID-19 are in a state of rapid change based on information released by regulatory bodies including the CDC and federal and state organizations. These policies and algorithms were followed during the patient's care in the ED.  Some ED evaluations and interventions may be delayed as a result of limited staffing during and the pandemic.*   Note:  This document was prepared using Dragon voice recognition software and may include unintentional dictation errors.   [place] Basham, Delice Bison, DO 12/09/20 [phone]

## 2020-12-09 NOTE — Progress Notes (Signed)
GOALS OF CARE DISCUSSION  The Clinical status was relayed to family in detail. Mother at bedside  Updated and notified of patients medical condition.  Patient remains unresponsive and will not open eyes to command.   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 Patient remains full code  Family are satisfied with Plan of action and management. All questions answered  Additional CC time 32 mins   Jeannifer Drakeford Patricia Pesa, M.D.  [facility]  Medical Director [place] Director [facility] Cardio-Pulmonary Department

## 2020-12-09 NOTE — ED Notes (Signed)
Sent message to Dr. Mortimer Fries regarding pt sedation level on current medications and what mother at bedside reports pt had to received last hospitalization for intubation.

## 2020-12-09 NOTE — ED Notes (Signed)
Pt continues to have increasing work of breathing with bipap in place.

## 2020-12-09 NOTE — ED Notes (Signed)
Pt and mother verbal consent for intubation.

## 2020-12-09 NOTE — Procedures (Signed)
  PROCEDURE: BRONCHOSCOPY Therapeutic Aspiration of Tracheobronchial Tree  PROCEDURE DATE: 12/09/2020  TIME:  NAME:  Melinda Steele  DOB:[birth year]  MRN: [id number] LOC:  IC05A/IC05A-AA    HOSP DAY: @LENGTHOFSTAYDAYS @ CODE STATUS:      Code Status Orders  (From admission, onward)         Start     Ordered   12/09/20 0635  Full code  Continuous        12/09/20 0634        Code Status History    This patient has a current code status but no historical code status.   Advance Care Planning Activity          Indications/Preliminary Diagnosis:   Consent: (Place X beside choice/s below)  The benefits, risks and possible complications of the procedure were        explained to:  ___ patient  ___ patient's family  ___ other:___________  who verbalized understanding and gave:  ___ verbal  ___ written  ___ verbal and written  ___ telephone  ___ other:________ consent.      Unable to obtain consent; procedure performed on emergent basis.     Other:       PRESEDATION ASSESSMENT: History and Physical has been performed. Patient meds and allergies have been reviewed. Presedation airway examination has been performed and documented. Baseline vital signs, sedation score, oxygenation status, and cardiac rhythm were reviewed. Patient was deemed to be in satisfactory condition to undergo the procedure.      PROCEDURE DETAILS: Timeout performed and correct patient, name, & ID confirmed. Following prep per Pulmonary policy, appropriate sedation was administered. The Bronchoscope was inserted in to oral cavity with bite block in place. Therapeutic aspiration of Tracheobronchial tree was performed.  Airway exam proceeded with findings, technical procedures, and specimen collection as noted below. At the end of exam the scope was withdrawn without incident. Impression and Plan as noted below.        Airway Prep (Place X beside choice below)   1% Transtracheal Lidocaine Anesthetization 7  cc   Patient prepped per Bronchoscopy Lab Policy       Insertion Route (Place X beside choice below)   Nasal   Oral  x Endotracheal Tube   Tracheostomy    TECHNICAL PROCEDURES: (Place X beside choice below)   Procedures  Description    None     Electrocautery     Cryotherapy     Balloon Dilatation     Bronchography     Stent Placement     Therapeutic Aspiration     Laser/Argon Plasma    Brachytherapy Catheter Placement    Foreign Body Removal         SPECIMENS (Sites): (Place X beside choice below)  Specimens Description   No Specimens Obtained     Washings   x Lavage Clear thin secretions   Biopsies    Fine Needle Aspirates    Brushings    Sputum    FINDINGS: clear secretions ESTIMATED BLOOD LOSS: none COMPLICATIONS/RESOLUTION: none      IMPRESSION:POST-PROCEDURE DX: R/O PNEUMONIA assess for PCP, MYCOBACTERIAL SPECIES, GRAM STAIN AND CULTURES    RECOMMENDATION/PLAN:  Follow up CULTURES    Corrin Parker, M.D.  [facility]  Medical Director [place] Director [place] Department

## 2020-12-09 NOTE — Progress Notes (Addendum)
PHARMACY CONSULT NOTE - FOLLOW UP  Pharmacy Consult for Electrolyte Monitoring and Replacement   Recent Labs: Potassium (mmol/L)  Date Value  12/09/2020 4.5   Magnesium (mg/dL)  Date Value  12/09/2020 1.9   Calcium (mg/dL)  Date Value  12/09/2020 7.8 (L)   Phosphorus (mg/dL)  Date Value  12/09/2020 6.0 (H)   Sodium (mmol/L)  Date Value  12/09/2020 141    Assessment: 20 yo F presented with respiratory distress and hypertension. Pt intubated 3/16. PMH includes lupus nephritis, HTN, and seizures. Pharmacy consulted to monitor and replace electrolytes. Pt receiving tube feeds.   Goal of Therapy:  Electrolytes WNL  Plan:  --All electrolytes WNL at this time --Monitor electrolytes with AM labs  Benn Moulder, PharmD Pharmacy Resident  12/09/2020 12:05 PM

## 2020-12-09 NOTE — Progress Notes (Signed)
Initial Nutrition Assessment  DOCUMENTATION CODES:   Not applicable  INTERVENTION:   Initiate Vital HP @30ml /hr + ProSource TF 61ml QID via tube   Propofol: 30.5 ml/hr- provides 805kcal/day   Free water flushes 47ml q4 hours to maintain tube patency   Regimen provides 1685kcal/day, 107g/day protein and 746ml/day free water   Liquid MVI daily via tube   Pt at high refeed risk; recommend monitor potassium, magnesium and phosphorus labs daily until stable  NUTRITION DIAGNOSIS:   Inadequate oral intake related to inability to eat (pt sedated and ventilated) as evidenced by meal completion < 50%.  GOAL:   Provide needs based on ASPEN/SCCM guidelines  MONITOR:   Vent status,Labs,Weight trends,Skin,I & O's,TF tolerance  REASON FOR ASSESSMENT:   Ventilator    ASSESSMENT:   20 y/o female with h/o systemic lupus erythematosus with glomerular disease, HTN, seizures and recent COVID 19 who is admitted with acute hypoxic respiratory failure secondary to flash pulmonary edema in the setting of hypertensive emergency requiring mechanical intubation   Pt sedated and ventilated. OGT in place. Plan is to start tube feeds today. Pt's mother at bedside reports pt with good appetite and oral intake at baseline; pt is a vegetarian. RD suspects pt with decreased appetite and oral intake pta r/t recent COVID 19. Pt is likely at refeed risk.   Per chart, pt's UBW appears to be ~150-155lbs. Pt currently down 10lbs(7%) since September; RD unsure how recently weight loss occurred.   Medications reviewed and include: heparin, zofran, prednisone, fentanyl, proprofol   Labs reviewed: K 4.5 wnl, BUN 25(H), creat 1.34(H), P 6.0(H), Mg 1.9 wnl Wbc-12.0(H), Hgb 7.4(L), Hct 24.2(L)  Patient is currently intubated on ventilator support MV: 10.8 L/min Temp (24hrs), Avg:97.2 F (36.2 C), Min:94.6 F (34.8 C), Max:97.7 F (36.5 C)  Propofol: 30.5 ml/hr- provides 805kcal/day   MAP- >66mmHg  UOP-  653ml  NUTRITION - FOCUSED PHYSICAL EXAM:  Flowsheet Row Most Recent Value  Orbital Region No depletion  Upper Arm Region No depletion  Thoracic and Lumbar Region No depletion  Buccal Region No depletion  Temple Region No depletion  Clavicle Bone Region No depletion  Clavicle and Acromion Bone Region No depletion  Scapular Bone Region No depletion  Dorsal Hand No depletion  Patellar Region No depletion  Anterior Thigh Region No depletion  Posterior Calf Region No depletion  Edema (RD Assessment) None  Hair Reviewed  Eyes Reviewed  Mouth Reviewed  Skin Reviewed  Nails Reviewed     Diet Order:   Diet Order    None     EDUCATION NEEDS:   No education needs have been identified at this time  Skin:  Skin Assessment: Reviewed RN Assessment  Last BM:  3/16  Height:   Ht Readings from Last 1 Encounters:  12/09/20 5\' 6"  (1.676 m) (75 %, Z= 0.67)*   * Growth percentiles are based on CDC (Girls, 2-20 Years) data.    Weight:   Wt Readings from Last 1 Encounters:  12/09/20 63.5 kg (69 %, Z= 0.49)*   * Growth percentiles are based on CDC (Girls, 2-20 Years) data.    Ideal Body Weight:  59 kg  BMI:  Body mass index is 22.6 kg/m.  Estimated Nutritional Needs:   Kcal:  1589kcal/day  Protein:  90-105g/day  Fluid:  1.8-2.1L/day  Koleen Distance MS, RD, LDN Please refer to [facility] for RD and/or RD on-call/weekend/after hours pager

## 2020-12-09 NOTE — ED Notes (Signed)
Lab at bedside

## 2020-12-09 NOTE — ED Notes (Signed)
Notified RN Delilah Shan that patient has bed assignment

## 2020-12-09 NOTE — H&P (Signed)
NAME:  Melinda Steele, MRN:  [id number], DOB:  2000/11/16, LOS: 0 ADMISSION DATE:  12/09/2020, CONSULTATION DATE: 12/09/2020 REFERRING MD: Dr. Leonides Schanz, CHIEF COMPLAINT: Shortness of Breath   Brief History:  20 yo female with systemic lupus erythematosus with glomerular disease admitted with acute hypoxic respiratory failure secondary to flash pulmonary edema in the setting of hypertensive emergency requiring mechanical intubation   History of Present Illness:  This is a 20 yo female who presented to [facility] ER on 03/16 via EMS with worsening shortness of breath during the night of 03/16.  Pts mother concerned about possible pericardial effusion due to pts previous hx of this.  Upon review of pts records she was hospitalized at [facility] from 01/8-01/14/2022 due to lupus flare along with pericardial effusion with cardiac tamponade which required pericardiocentesis and drain placement with removal.  She was then admitted again from 10/21/2020-11/12/2020 for PRES, symptomatic edema, and was COVID positive.    Pt has hx of systemic lupus erythematosus with glomerular disease which is managed by [facility].  She is currently on Cytoxan, Hydroxychloroquine, Prednisone, and Rituximab.  She recently received her 5th dose of Cytoxan EL and Rituxmab on 03/14.   ED course Upon arrival to the ER pt noted to have continued respiratory failure and initially placed on 6L O2 via nasal canula.  However, due to increased work of breathing pt placed on Bipap but failed Bipap requiring mechanical intubation.  Bedside ultrasound performed by ER physician which did not reveal pericardial effusion.  CXR concerning for cardiomegaly with diffuse interstitial and airspace opacities.  Lab results revealed chloride 115, CO2 18, BUN 25, creatinine 1.34, troponin 27, wbc 12.0, hgb 7.4, and abg pH 7.28/pCO2 37/pO2 146/acid-base deficit 8.6/bicarb 17.4.  Pt also hypertensive bp 198/12, hr 122, and temp 97.4 F orally.    Past Medical  History:   Active Ambulatory Problems    Diagnosis Date Noted   Systemic lupus erythematosus ([place]) 09/18/2019   Resolved Ambulatory Problems    Diagnosis Date Noted   No Resolved Ambulatory Problems   Past Medical History:  Diagnosis Date   Hypertension    Lupus ([place])    Lupus nephritis ([place])    Seizure ([place])      Significant Hospital Events:  3/15 admitted for severe resp failure 3/16 multiorgan failure  Consults:  PCCM  Procedures:  3/15 ETT intubated  Significant Diagnostic Tests:  UDS NEG  Micro Data:  COVID NEG VIRAL PANEL NEG  Antimicrobials:  NA     Objective   Blood pressure (!) 150/113, pulse (!) 120, temperature 97.6 F (36.4 C), resp. rate (!) 38, height _0  (1.676 m), weight 63.5 kg, SpO2 95 %.    Vent Mode: AC FiO2 (%):  [70 %] 70 % Set Rate:  [18 bmp] 18 bmp Vt Set:  [410 mL] 410 mL PEEP:  [5 cmH20] 5 cmH20  No intake or output data in the 24 hours ending 12/09/20 0634 Filed Weights   12/09/20 0525  Weight: 63.5 kg    REVIEW OF SYSTEMS  PATIENT IS UNABLE TO PROVIDE COMPLETE REVIEW OF SYSTEM S DUE TO SEVERE CRITICAL ILLNESS AND ENCEPHALOPATHY  PHYSICAL EXAMINATION:  GENERAL:critically ill appearing, +resp distress HEAD: Normocephalic, atraumatic.  EYES: Pupils equal, round, reactive to light.  No scleral icterus.  MOUTH: Moist mucosal membrane. NECK: Supple. No thyromegaly. No nodules. No JVD.  PULMONARY: +rhonchi, +wheezing CARDIOVASCULAR: S1 and S2. Regular rate and rhythm. No murmurs, rubs, or gallops.  GASTROINTESTINAL: Soft, nontender, -distended. Positive  bowel sounds.  MUSCULOSKELETAL: No swelling, clubbing, or edema.  NEUROLOGIC: obtunded SKIN:intact,warm,dry     Assessment & Plan:  20 yo female with end stage lupus with h/o previous lung infection and prolonged hosp stay for progressive lupus involving heart, lungs, kidneys,brain) admitted to ICU for severe SOB and severe hypoxic resp failure from acute b/l  infiltrates with HTN emergency pulm edema versus atypical infection   Severe Hypoxic and Hypercapnic Respiratory Failure -continue Full MV support -continue Bronchodilator Therapy -Wean Fio2 and PEEP as tolerated Plan for bronch obtain BAL for cultures and r/o PCP   CARDIAC FAILURE-h/o pericardial effusion -oxygen as needed -Lasix as tolerated -follow up cardiac enzymes as indicated Obtain echo   ACUTE KIDNEY INJURY/Renal Failure -continue Foley Catheter-assess need -Avoid nephrotoxic agents -Follow urine output, BMP -Ensure adequate renal perfusion, optimize oxygenation -Renal dose medications Nephrology consulted    NEUROLOGY - intubated and sedated - minimal sedation to achieve a RASS goal: -1  HTN CRISIS  NICARDIPINE INFUSION RESTART HOME MEDS  CARDIAC ICU monitoring  ID Obtain bronch BAL   ENDO - ICU hypoglycemic\Hyperglycemia protocol -check FSBS per protocol   GI/Nutrition GI PROPHYLAXIS TF's as tolerated Constipation protocol as indicated  ELECTROLYTES -follow labs as needed -replace as needed -pharmacy consultation and following   DVT/GI PRX ordered TRANSFUSIONS AS NEEDED MONITOR FSBS ASSESS the need for LABS as needed   DVT/GI PRX ordered and assessed TRANSFUSIONS AS NEEDED MONITOR FSBS I Assessed the need for Labs I Assessed the need for Foley I Assessed the need for Central Venous Line Family Discussion when available I Assessed the need for Mobilization I made an Assessment of medications to be adjusted accordingly Safety Risk assessment completed  CASE DISCUSSED IN MULTIDISCIPLINARY ROUNDS WITH ICU TEAM     Labs   CBC: Recent Labs  Lab 12/09/20 0527  WBC 12.0*  NEUTROABS 10.5*  HGB 7.4*  HCT 24.2*  MCV 92.7  PLT 811    Basic Metabolic Panel: Recent Labs  Lab 12/09/20 0527  NA 141  K 4.5  CL 115*  CO2 18*  GLUCOSE 144*  BUN 25*  CREATININE 1.34*  CALCIUM 7.8*   GFR: Estimated Creatinine Clearance:  63.2 mL/min (A) (by C-G formula based on SCr of 1.34 mg/dL (H)). Recent Labs  Lab 12/09/20 0527  WBC 12.0*    Liver Function Tests: No results for input(s): AST, ALT, ALKPHOS, BILITOT, PROT, ALBUMIN in the last 168 hours. No results for input(s): LIPASE, AMYLASE in the last 168 hours. No results for input(s): AMMONIA in the last 168 hours.  ABG    Component Value Date/Time   PHART 7.28 (L) 12/09/2020 0537   PCO2ART 37 12/09/2020 0537   PO2ART 146 (H) 12/09/2020 0537   HCO3 17.4 (L) 12/09/2020 0537   ACIDBASEDEF 8.6 (H) 12/09/2020 0537   O2SAT 99.0 12/09/2020 0537     Coagulation Profile: Recent Labs  Lab 12/09/20 0527  INR 1.0    Cardiac Enzymes: No results for input(s): CKTOTAL, CKMB, CKMBINDEX, TROPONINI in the last 168 hours.  HbA1C: No results found for: HGBA1C  CBG: No results for input(s): GLUCAP in the last 168 hours.     Social History:   reports that she has never smoked. She has never used smokeless tobacco. She reports that she does not drink alcohol and does not use drugs.   Family History:  Her family history is not on file.   Allergies Allergies  Allergen Reactions   Amlodipine Swelling     Home  Medications  Prior to Admission medications   Medication Sig Start Date End Date Taking? Authorizing Provider  acetaminophen (TYLENOL) 500 MG tablet Take 500 mg by mouth every 6 (six) hours as needed. 11/11/20 11/11/21 Yes [provider]  ascorbic acid (VITAMIN C) 500 MG tablet Take 500 mg by mouth daily.   Yes [provider]  calcium carbonate (OSCAL) 1500 (600 Ca) MG TABS tablet Take 1,500 mg by mouth daily with breakfast.   Yes [provider]  carvedilol (COREG) 25 MG tablet Take 25 mg by mouth 2 (two) times daily. 11/12/20  Yes [provider]  chlorthalidone (HYGROTON) 25 MG tablet Take 25 mg by mouth 2 (two) times daily. 12/07/20  Yes [provider]  cloNIDine (CATAPRES - DOSED IN MG/24 HR) 0.3  mg/24hr patch Place 0.3 mg onto the skin once a week. 11/17/20  Yes [provider]  ferrous sulfate 325 (65 FE) MG tablet Take 1 tablet by mouth daily. 11/12/20 11/12/21 Yes [provider]  hydroxychloroquine (PLAQUENIL) 200 MG tablet Take 400 mg by mouth daily.   Yes [provider]  levETIRAcetam (KEPPRA) 1000 MG tablet Take 1,000 mg by mouth 2 (two) times daily. 11/12/20  Yes [provider]  lisinopril (ZESTRIL) 5 MG tablet Take 5 mg by mouth at bedtime. 11/12/20  Yes [provider]  magnesium oxide (MAG-OX) 400 MG tablet Take 400 mg by mouth daily. 11/12/20 11/12/21 Yes [provider]  melatonin 3 MG TABS tablet Take 3 mg by mouth at bedtime. 11/11/20 11/11/21 Yes [provider]  NIFEdipine (ADALAT CC) 30 MG 24 hr tablet Take 30 mg by mouth daily. 11/12/20  Yes [provider]  NIFEdipine (PROCARDIA-XL/NIFEDICAL-XL) 30 MG 24 hr tablet Take 1 tablet by mouth every 8 (eight) hours as needed. 11/12/20 11/12/21 Yes [provider]  pantoprazole (PROTONIX) 40 MG tablet Take 40 mg by mouth daily. 11/12/20  Yes [provider]  predniSONE (DELTASONE) 20 MG tablet Take 20 mg by mouth 2 (two) times daily. 12/07/20  Yes [provider]  riboflavin (VITAMIN B-2) 100 MG TABS tablet Take 100 mg by mouth daily. 11/12/20 11/12/21 Yes [provider]  sodium bicarbonate 650 MG tablet Take 650 mg by mouth 2 (two) times daily. 11/12/20  Yes [provider]  torsemide (DEMADEX) 10 MG tablet Take 30 mg by mouth daily. 11/12/20  Yes [provider]      Critical Care Time devoted to patient care services described in this note is 65 minutes.   Overall, patient is critically ill, prognosis is guarded.  Patient with Multiorgan failure and at high risk for cardiac arrest and death.    Corrin Parker, M.D.  [facility]  Medical Director [place]  Director [facility] Cardio-Pulmonary Department

## 2020-12-09 NOTE — ED Notes (Addendum)
Report to Roselyn Reef, RN   Taken to floor by RT, Joellen Jersey, RN and Faith EDT with mother at bedside.

## 2020-12-09 NOTE — Progress Notes (Signed)
*  PRELIMINARY RESULTS* Echocardiogram 2D Echocardiogram has been performed.  Melinda Steele 12/09/2020, 9:16 AM

## 2020-12-09 NOTE — ED Triage Notes (Signed)
Per EMS pt has had difficulty breathing since last night increasing in difficulty during the night. Pt has a sx of Lupus. Pt not very cooperative per EMS. O2 sat 90% on their arrival Placed on 6L sats 95%. HR 130 SR RR 60 at rest

## 2020-12-10 ENCOUNTER — Encounter: Payer: Self-pay | Admitting: Internal Medicine

## 2020-12-10 DIAGNOSIS — I313 Pericardial effusion (noninflammatory): Secondary | ICD-10-CM

## 2020-12-10 DIAGNOSIS — C801 Malignant (primary) neoplasm, unspecified: Secondary | ICD-10-CM

## 2020-12-10 DIAGNOSIS — I5021 Acute systolic (congestive) heart failure: Secondary | ICD-10-CM

## 2020-12-10 DIAGNOSIS — D649 Anemia, unspecified: Secondary | ICD-10-CM

## 2020-12-10 DIAGNOSIS — I420 Dilated cardiomyopathy: Secondary | ICD-10-CM

## 2020-12-10 DIAGNOSIS — R0603 Acute respiratory distress: Secondary | ICD-10-CM

## 2020-12-10 DIAGNOSIS — I161 Hypertensive emergency: Secondary | ICD-10-CM

## 2020-12-10 DIAGNOSIS — J810 Acute pulmonary edema: Secondary | ICD-10-CM

## 2020-12-10 LAB — CBC
HCT: 19.7 % — ABNORMAL LOW (ref 36.0–46.0)
Hemoglobin: 6.2 g/dL — ABNORMAL LOW (ref 12.0–15.0)
MCH: 28.3 pg (ref 26.0–34.0)
MCHC: 31.5 g/dL (ref 30.0–36.0)
MCV: 90 fL (ref 80.0–100.0)
Platelets: 179 10*3/uL (ref 150–400)
RBC: 2.19 MIL/uL — ABNORMAL LOW (ref 3.87–5.11)
RDW: 15.7 % — ABNORMAL HIGH (ref 11.5–15.5)
WBC: 9.3 10*3/uL (ref 4.0–10.5)
nRBC: 0 % (ref 0.0–0.2)

## 2020-12-10 LAB — BASIC METABOLIC PANEL
Anion gap: 6 (ref 5–15)
BUN: 36 mg/dL — ABNORMAL HIGH (ref 6–20)
CO2: 19 mmol/L — ABNORMAL LOW (ref 22–32)
Calcium: 7.6 mg/dL — ABNORMAL LOW (ref 8.9–10.3)
Chloride: 110 mmol/L (ref 98–111)
Creatinine, Ser: 1.76 mg/dL — ABNORMAL HIGH (ref 0.44–1.00)
GFR, Estimated: 42 mL/min — ABNORMAL LOW (ref >60)
Glucose, Bld: 93 mg/dL (ref 70–99)
Potassium: 5 mmol/L (ref 3.5–5.1)
Sodium: 135 mmol/L (ref 135–145)

## 2020-12-10 LAB — CK: Total CK: 22 U/L — ABNORMAL LOW (ref 38–234)

## 2020-12-10 LAB — URINALYSIS, ROUTINE W REFLEX MICROSCOPIC
Bacteria, UA: NONE SEEN
Bilirubin Urine: NEGATIVE
Glucose, UA: NEGATIVE mg/dL
Hgb urine dipstick: NEGATIVE
Ketones, ur: NEGATIVE mg/dL
Nitrite: NEGATIVE
Protein, ur: >=300 mg/dL — AB
Specific Gravity, Urine: 1.012 (ref 1.005–1.030)
Squamous Epithelial / HPF: NONE SEEN (ref 0–5)
pH: 7 (ref 5.0–8.0)

## 2020-12-10 LAB — PROTEIN / CREATININE RATIO, URINE
Creatinine, Urine: 47 mg/dL
Protein Creatinine Ratio: 9.38 mg/mg{Cre} — ABNORMAL HIGH (ref 0.00–0.15)
Total Protein, Urine: 441 mg/dL

## 2020-12-10 LAB — PHOSPHORUS: Phosphorus: 6 mg/dL — ABNORMAL HIGH (ref 2.5–4.6)

## 2020-12-10 LAB — ACID FAST SMEAR (AFB, MYCOBACTERIA): Acid Fast Smear: NEGATIVE

## 2020-12-10 LAB — TRIGLYCERIDES: Triglycerides: 329 mg/dL — ABNORMAL HIGH (ref <150)

## 2020-12-10 LAB — TROPONIN I (HIGH SENSITIVITY): Troponin I (High Sensitivity): 63 ng/L — ABNORMAL HIGH (ref <18)

## 2020-12-10 LAB — GLUCOSE, CAPILLARY
Glucose-Capillary: 93 mg/dL (ref 70–99)
Glucose-Capillary: 96 mg/dL (ref 70–99)

## 2020-12-10 LAB — MAGNESIUM: Magnesium: 2.1 mg/dL (ref 1.7–2.4)

## 2020-12-10 LAB — PROCALCITONIN: Procalcitonin: 5.58 ng/mL

## 2020-12-10 MED ORDER — SODIUM CHLORIDE 0.9 % IV SOLN: 3.0000 g | Freq: Four times a day (QID) | INTRAVENOUS | Status: DC

## 2020-12-10 MED ORDER — CLONIDINE HCL 0.3 MG/24HR TD PTWK
0.3000 mg | MEDICATED_PATCH | TRANSDERMAL | Status: DC
  Administered 2020-12-10: 0.3 mg via TRANSDERMAL
  Filled 2020-12-10: qty 1

## 2020-12-10 MED ORDER — SODIUM CHLORIDE 0.9 % IV SOLN
3.0000 g | Freq: Four times a day (QID) | INTRAVENOUS | Status: DC
  Administered 2020-12-10 (×2): 3 g via INTRAVENOUS
  Filled 2020-12-10: qty 3
  Filled 2020-12-10 (×2): qty 8
  Filled 2020-12-10: qty 3
  Filled 2020-12-10: qty 8

## 2020-12-10 MED ORDER — PANTOPRAZOLE SODIUM 40 MG PO TBEC: 40.0000 mg | DELAYED_RELEASE_TABLET | Freq: Every day | ORAL | Status: DC

## 2020-12-10 MED ORDER — ADULT MULTIVITAMIN W/MINERALS CH: 1.0000 | ORAL_TABLET | Freq: Every day | ORAL | Status: DC

## 2020-12-10 MED ORDER — CLONIDINE 0.3 MG/24HR TD PTWK: 0.3000 mg | MEDICATED_PATCH | TRANSDERMAL | 12 refills | Status: DC

## 2020-12-10 MED ORDER — PREDNISONE 20 MG PO TABS
20.0000 mg | ORAL_TABLET | Freq: Two times a day (BID) | ORAL | Status: DC
  Administered 2020-12-10: 20 mg via ORAL
  Filled 2020-12-10: qty 1

## 2020-12-10 MED ORDER — LABETALOL HCL 5 MG/ML IV SOLN
10.0000 mg | Freq: Once | INTRAVENOUS | Status: AC
  Administered 2020-12-10: 10 mg via INTRAVENOUS
  Filled 2020-12-10: qty 4

## 2020-12-10 MED ORDER — LEVETIRACETAM 100 MG/ML PO SOLN
1000.0000 mg | Freq: Two times a day (BID) | ORAL | Status: DC
  Filled 2020-12-10: qty 10

## 2020-12-10 NOTE — Progress Notes (Signed)
Report given to Christa with [facility] and Sherrin Daisy, RN at [facility] in the PICU.  Patient is going to 2C07. All questions and concerns addressed. [facility] reports they do not have a unit available at this moment but will reach out to find the earliest transport available.

## 2020-12-10 NOTE — Progress Notes (Signed)
Nutrition Follow Up Note   DOCUMENTATION CODES:   Not applicable  INTERVENTION:   RD will add supplements once diet is advanced   NUTRITION DIAGNOSIS:   Inadequate oral intake related to inability to eat (pt sedated and ventilated) as evidenced by meal completion < 50%.  GOAL:   Patient will meet greater than or equal to 90% of their needs  -previously met with tube feeds   MONITOR:   Diet advancement,Labs,Weight trends,Skin,I & O's  ASSESSMENT:   20 y/o female with h/o systemic lupus erythematosus with glomerular disease, HTN, seizures and recent COVID 19 who is admitted with acute hypoxic respiratory failure secondary to flash pulmonary edema in the setting of hypertensive emergency requiring mechanical intubation   Pt extubated today. RD will add supplements once diet is advanced. Pt s/p echo and found to have EF of 25-30%; cardiology consult pending.   Medications reviewed and include: heparin, zofran, protonix, prednisone, unasyn   Labs reviewed: K 5.0 wnl, BUN 36(H), creat 1.76(H), P 6.0(H), Mg 2.1 wnl Hgb 6.2(L), Hct 19.7(L)  Diet Order:   Diet Order    None     EDUCATION NEEDS:   No education needs have been identified at this time  Skin:  Skin Assessment: Reviewed RN Assessment  Last BM:  3/16  Height:   Ht Readings from Last 1 Encounters:  12/10/20 5' 5.98" (1.676 m) (75 %, Z= 0.66)*   * Growth percentiles are based on CDC (Girls, 2-20 Years) data.    Weight:   Wt Readings from Last 1 Encounters:  12/10/20 63.7 kg (69 %, Z= 0.51)*   * Growth percentiles are based on CDC (Girls, 2-20 Years) data.    Ideal Body Weight:  59 kg  BMI:  Body mass index is 22.68 kg/m.  Estimated Nutritional Needs:   Kcal:  1800-2100kcal/day  Protein:  90-105g/day  Fluid:  1.8-2.1L/day  Koleen Distance MS, RD, LDN Please refer to [facility] for RD and/or RD on-call/weekend/after hours pager

## 2020-12-10 NOTE — Consult Note (Signed)
Cardiology Consultation:   Patient ID: Melinda Steele; [id number]; 05/05/2001   Admit date: 12/09/2020 Date of Consult: 12/10/2020  Primary Care Provider: [place] Primary Cardiologist: [facility] Primary Electrophysiologist:  None   Patient Profile:   Melinda Steele is a 20 y.o. 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 Mortimer Fries.  History of Present Illness:   Melinda Steele has a complex history as outlined above.  She has history of pericardial effusion in 08/2016.  She recently had a hospitalization at [facility] in early 09/2020 with pericarditis in the setting of lupus flare due to medication noncompliance complicated by pericardial effusion with tamponade physiology requiring pericardiocentesis and drain placement.  Repeat echo on 10/14/2020 showed an improved small pericardial effusion present posterior to the LV with no evidence of tamponade physiology.  She was readmitted in late 09/2020 with status epilepticus requiring mechanical ventilation for airway protection.  MRI imaging consistent with PRES. admission was further complicated by pleural effusions requiring chest tube placement on the left side as well as diuresis.  She was also noted to have a significant hemolytic anemia with hemoglobin dropping into the low sixes requiring multiple units of packed red blood cells with her anemia ultimately being felt to be related to her SLE.  She did require I&D for axillary abscess during this admission.  She underwent numerous echoes as outlined below, with the last one during this admission occurring on 2/14 which showed preserved LV systolic function  with marked reduction in her pericardial effusion.  For further details of her prolonged admissions to [facility], please see discharge summaries and Care Everywhere.  Following this most recent admission she presented to [facility] for several infusions for her SLE on 12/07/2020.  During these infusions she was given IV fluids and her torsemide was held out of concern for being volume depleted.  She presented to [facility] ED on 3/16 with respiratory distress secondary to flash pulmonary edema in the setting of hypertensive emergency requiring brief mechanical ventilation.  Initial BP in the 545G to 256L systolic.  Upon her arrival she was noted to be on 6 L via nasal cannula however due to increased work of breathing she was placed on BiPAP though failed this and she was subsequently mechanically ventilated.  Bedside ultrasound performed by ER physician did not reveal pericardial effusion per documented H&P.  Chest x-ray was concerning for cardiomegaly with diffuse interstitial and airspace opacities.  Labs were notable for a BNP greater than 4500, initial high-sensitivity troponin of 27 with a delta troponin of 121, abg pH 7.28/pCO2 37/pO2 146/acid-base deficit 8.6/bicarb 17.4, urine drug screen negative, negative D-dimer, Covid and influenza negative, hemoglobin 7.4 trending to 6.2 this morning.  EKG shows sinus tachycardia, 133 bpm, LVH, poor R wave progression along the precordial leads, and nonspecific ST-T changes.  Echo demonstrated a new cardiomyopathy with an EF of 25 to 30%, global hypokinesis, grade 2 diastolic dysfunction, normal RV systolic function and ventricular cavity size, moderate pericardial effusion without tamponade physiology, and no significant valvular abnormalities.   Past Medical History:  Diagnosis Date   Hypertension    Lupus ([place])    Lupus nephritis ([place])    Seizure ([place])  History reviewed. No pertinent surgical history.   Home Meds: Prior to Admission medications   Medication Sig  Start Date End Date Taking? Authorizing Provider  acetaminophen (TYLENOL) 500 MG tablet Take 500 mg by mouth every 6 (six) hours as needed. 11/11/20 11/11/21 Yes [provider]  ascorbic acid (VITAMIN C) 500 MG tablet Take 500 mg by mouth daily.   Yes [provider]  calcium carbonate (OSCAL) 1500 (600 Ca) MG TABS tablet Take 1,500 mg by mouth daily with breakfast.   Yes [provider]  carvedilol (COREG) 25 MG tablet Take 25 mg by mouth 2 (two) times daily. 11/12/20  Yes [provider]  chlorthalidone (HYGROTON) 25 MG tablet Take 25 mg by mouth 2 (two) times daily. 12/07/20  Yes [provider]  cloNIDine (CATAPRES - DOSED IN MG/24 HR) 0.3 mg/24hr patch Place 0.3 mg onto the skin once a week. 11/17/20  Yes [provider]  ferrous sulfate 325 (65 FE) MG tablet Take 1 tablet by mouth daily. 11/12/20 11/12/21 Yes [provider]  hydroxychloroquine (PLAQUENIL) 200 MG tablet Take 400 mg by mouth daily.   Yes [provider]  levETIRAcetam (KEPPRA) 1000 MG tablet Take 1,000 mg by mouth 2 (two) times daily. 11/12/20  Yes [provider]  lisinopril (ZESTRIL) 5 MG tablet Take 5 mg by mouth at bedtime. 11/12/20  Yes [provider]  magnesium oxide (MAG-OX) 400 MG tablet Take 400 mg by mouth daily. 11/12/20 11/12/21 Yes [provider]  melatonin 3 MG TABS tablet Take 3 mg by mouth at bedtime. 11/11/20 11/11/21 Yes [provider]  NIFEdipine (ADALAT CC) 30 MG 24 hr tablet Take 30 mg by mouth daily. 11/12/20  Yes [provider]  NIFEdipine (PROCARDIA-XL/NIFEDICAL-XL) 30 MG 24 hr tablet Take 1 tablet by mouth every 8 (eight) hours as needed. 11/12/20 11/12/21 Yes [provider]  pantoprazole (PROTONIX) 40 MG tablet Take 40 mg by mouth daily. 11/12/20  Yes [provider]  predniSONE (DELTASONE) 20 MG tablet Take 20 mg by mouth 2 (two) times daily. 12/07/20  Yes [provider]   riboflavin (VITAMIN B-2) 100 MG TABS tablet Take 100 mg by mouth daily. 11/12/20 11/12/21 Yes [provider]  sodium bicarbonate 650 MG tablet Take 650 mg by mouth 2 (two) times daily. 11/12/20  Yes [provider]  torsemide (DEMADEX) 10 MG tablet Take 30 mg by mouth daily. 11/12/20  Yes [provider]  triamcinolone (KENALOG) 0.1 % Apply topically 2 (two) times daily. 11/13/20  Yes [provider]    Inpatient Medications: Scheduled Meds:  chlorhexidine gluconate (MEDLINE KIT)  15 mL Mouth Rinse BID   Chlorhexidine Gluconate Cloth  6 each Topical Daily   feeding supplement (PROSource TF)  45 mL Per Tube QID   feeding supplement (VITAL HIGH PROTEIN)  1,000 mL Per Tube Q24H   free water  30 mL Per Tube Q4H   heparin  5,000 Units Subcutaneous Q8H   levETIRAcetam  1,000 mg Per Tube BID   mouth rinse  15 mL Mouth Rinse 10 times per day   multivitamin  15 mL Per Tube Daily   ondansetron (ZOFRAN) IV  4 mg Intravenous Once   pantoprazole sodium  40 mg Per Tube Daily   predniSONE  20 mg Per Tube BID WC   sodium chloride flush  10-40 mL Intracatheter Q12H   Continuous Infusions:  ampicillin-sulbactam (UNASYN) IV     fentaNYL infusion INTRAVENOUS 75 mcg/hr (12/10/20 1000)  niCARDipine Stopped (12/09/20 1056)   propofol (DIPRIVAN) infusion 20 mcg/kg/min (12/10/20 1000)   PRN Meds: acetaminophen, docusate sodium, LORazepam, ondansetron (ZOFRAN) IV, polyethylene glycol, sodium chloride flush  Allergies:   Allergies  Allergen Reactions   Amlodipine Swelling    Social History:   Social History   Socioeconomic History   Marital status: Single    Spouse name: Not on file   Number of children: Not on file   Years of education: Not on file   Highest education level: Not on file  Occupational History   Not on file  Tobacco Use   Smoking status: Never Smoker   Smokeless tobacco: Never Used  Substance and Sexual Activity    Alcohol use: Never   Drug use: Never   Sexual activity: Yes    Birth control/protection: OCP  Other Topics Concern   Not on file  Social History Narrative   Not on file   Social Determinants of Health   Financial Resource Strain: Not on file  Food Insecurity: Not on file  Transportation Needs: Not on file  Physical Activity: Not on file  Stress: Not on file  Social Connections: Not on file  Intimate Partner Violence: Not on file     Family History:   Family History  Problem Relation Age of Onset   Hyperlipidemia Maternal Grandmother        a. many medical problems, unknown per prior note    ROS:  Review of Systems  Unable to perform ROS: Medical condition    Still somewhat sedated following extubation   Physical Exam/Data:   Vitals:   12/10/20 0900 12/10/20 0952 12/10/20 1000 12/10/20 1040  BP: 113/76  (!) 123/91   Pulse: 96 (!) 102 (!) 103 (!) 113  Resp: 18 18 17  (!) 31  Temp: 99.68 F (37.6 C) 99.86 F (37.7 C) 99.86 F (37.7 C) 99.86 F (37.7 C)  TempSrc:      SpO2: 98% 96% 95% 95%  Weight:      Height:        Intake/Output Summary (Last 24 hours) at 12/10/2020 1111 Last data filed at 12/10/2020 1000 Gross per 24 hour  Intake 1476.63 ml  Output 994 ml  Net 482.63 ml   Filed Weights   12/09/20 0525 12/10/20 0426  Weight: 63.5 kg 63.7 kg   Body mass index is 22.68 kg/m.   Physical Exam: General: Well developed, well nourished, in no acute distress.  Chronically ill-appearing. Head: Normocephalic, atraumatic, sclera non-icteric, no xanthomas, nares without discharge.  Neck: Negative for carotid bruits. JVD not elevated. Lungs: Diminished breath sounds bilaterally. Breathing is unlabored. Heart: RRR with S1 S2. No murmurs, rubs, or gallops appreciated. Abdomen: Soft, non-tender, non-distended with normoactive bowel sounds. No hepatomegaly. No rebound/guarding. No obvious abdominal masses. Msk:  Strength and tone appear normal for  age. Extremities: No clubbing or cyanosis. No edema. Distal pedal pulses are 2+ and equal bilaterally. Neuro: Alert. Sedated. Psych:  Sedated.   EKG:  The EKG was personally reviewed and demonstrates: sinus tachycardia, 133 bpm, LVH, poor R wave progression along the precordial leads, and nonspecific ST-T changes Telemetry:  Telemetry was personally reviewed and demonstrates: SR  Weights: Filed Weights   12/09/20 0525 12/10/20 0426  Weight: 63.5 kg 63.7 kg    Relevant CV Studies:  2D echo 12/09/2020: 1. Left ventricular ejection fraction, by estimation, is 25 to 30%. The  left ventricle has severely decreased function. The left ventricle  demonstrates global hypokinesis. Left ventricular diastolic  parameters are  consistent with Grade II diastolic  dysfunction (pseudonormalization).  2. Right ventricular systolic function is normal. The right ventricular  size is normal.  3. Moderate pericardial effusion.  4. The mitral valve is normal in structure. No evidence of mitral valve  regurgitation.  5. The aortic valve is normal in structure. Aortic valve regurgitation is  not visualized.  6. The inferior vena cava is normal in size with <50% respiratory  variability, suggesting right atrial pressure of 8 mmHg. __________  2D echo 11/09/2020 [facility]): 1. Mild tricuspid valve regurgitation.  2. Trivial mitral valve insufficiency.  3. Normal aorta.  4. Right ventricular systolic pressure estimate = 13.8 mmHg.  5. Normal right ventricular cavity size and systolic function.  6. Normal left ventricular cavity size and systolic function.  7. Small pericardial effusion, reduced in size compared to examination of 10/31/19, largest 1.0-1.5 cm posterior to left ventricle.  8. There is no echocardiographic evidence of tamponade. __________  2D echo 10/30/2020 [facility]): 1. Moderate pericardial effusion, similar in size to examination of 10/30/19, largest 2.1-2.3 cm posterior to left  ventricle.  2. There is no echocardiographic evidence of tamponade.  3. Mild tricuspid valve regurgitation.  4. Right ventricular systolic pressure estimate = 24.0 mmHg.  5. Trivial mitralvalve insufficiency.  6. Normal left ventricular cavity size and systolic function.  7. Normal right ventricular cavity size and systolic function.  8. Normal aorta.  __________  2D echo 10/29/2020 [facility]): 1. Normal mitral valve.  2. Normal tricuspid valve.  3. Moderate pericardial effusion, increased in sizecompared to prior examination, similar in size to examinations of 01/13 and 01/14, largest 2.1-2.25 cm posterior to left ventricle. Few fibrinous strans evident in effusion. No evidence of tamponade physiology.  4. Intact atrial septum.  5. Normal main and branch pulmonary arteries.  6. Normal-size left atrium.  7. Normal-size right atrium.  8. Normal left ventricular cavity size and systolic function.  9. No patent ductus arteriosus.  10. Normal coronary arteries.  11. Normal aorta.  12. Normal aortic valve.  13. Normal right ventricular cavity size and systolic function.  14. Intact interventricular septum.  15. Normal pulmonary valve.  __________  2D echo 10/23/2020 [facility]): 1. Limited study to assess pericardial effusion.  2. The left ventricle is normal in size with mildly increased wall  thickness.  3. The left ventricular systolic function is normal, LVEF is visually  estimated at 55-60%.  4. There is a moderate, posterior pericardial effusion.  5. No significant change in size of pericardial effusion since prior study  from 10/21/20.  6. The right ventricle is normal in size, with normal systolic function.  7. The right atrium is mildly dilated in size.  8. There is no echocardigraphic evidence of tamponade physiology.  __________  2D echo 10/21/2020 [facility]): 1. Limited study to assess pericardial effusion.  2. The left ventricular systolic function  is overall normal,LVEF is  visually estimated at 50-55%.  3. The left ventricle is normal in size with upper normal wall thickness.  4. The right ventricle is normal in size, with normal systolic function.  5. There is a moderate, posterior pericardial effusion.  6. There is no echocardigraphic evidence of tamponade physiology.  __________  2D echo 10/14/2020 [facility]): 1. Improved, small pericardial effusion present posterior to left ventricle in PLAx and PSAx measuring 9 -12 mm at the level of the mitral valve papillary muscles; 4.6 mm from apical widow, lateral to the LV at the level of the mitral  valve papillary muscles, no effusion evident at apex. Trivial anterior effusion measuring 60m. No evidence of tamponade physiology.  2. Normal left ventricular cavity size and systolic function.  3. Normal right ventricular cavity size and systolic function.  __________  2D echo 10/09/2020 ([facility]: 1. Stable, small pericardial effusion present posterior to left ventricle measuring 14 to 16 mm at the level of the mitral valve papillary muscles; 12.3 mm from apical widow, lateral to the LV at the level of the mitral valve papillary muscles, no effusion evident at apex. Trivial anterior effusion measuring 427m No evidence of tamponade physiology.  2. Normal left ventricular cavity size and systolic function.  3. Normal right ventricular cavity size and systolic function. __________  [facility]. Stable small pericardial effusion compared to prior examination; effusion measures 1.0 - 1.47 cm posterior and lateral to left ventricle with almost no effusion evident at apex. Trivial sliver anterior measuring 31m12mNo evidence of tamponade physiology.  2. Normal-size left atrium.  3. Normal-size right atrium.  4. Intact atrial septum.  5. Normal mitral valve.  6. Normal tricuspid valve.  7. Normal left ventricular cavity size and systolic function.  8. Normal right  ventricular cavity size and systolic function.  9. Right ventricular systolic pressure estimate = 16.6 mmHg.  10. Intact interventricular septum.  11. Normal aorta.  12. Normal aortic valve.  13. Normal pulmonary valve.  14. Normal main and branch pulmonary arteries.  15. Normal pulmonary veins.  __________  [facility]. Small collection of pericardial fluid present posterior to left ventricle measuring 12 mm; no effusion evident at apex. Trivial sliver anterior measuring 4mm94mo evidence of tamponade physiology.  2. Normal right ventricular cavity size and systolic function.  3. Normalleft ventricular cavity size and systolic function.  __________  [facility]2020 [facility]. Normal mitral valve.  2. There is significant reduction of pericardial effusion compared to images of earlier today. Tiny sliver of pericardial fluid present posterior to left ventricle measuring 6 mm; no effusion evident at apex.  3. Intact atrial septum.  4. Normal-size left atrium.  5. Normal-size right atrium.  6. Normal left ventricular cavity size and systolic function.  7. Normal right ventricular cavity size and systolic function.  8. Intact interventricular septum.  __________  [facility]/2020 [facility]. There is significant reduction of pericardial effusion compared to early images of prior examination demonstrating large globally-distributed effusion. Pericardial effusion is not globally distributed, appears largest posterior and lateral to left ventricle measuring 1.22-1.85 cm, with approximately 5 mm anterior and 5-8 mm inferior to RV, and almost no effusion at apex.  2. Normal mitral valve.  3. There is no increased respiratory variation of mitral inflow Doppler flow, no suggestion of tamponade physiology.  4. There is no increased respiratory variation of aortic outflow Doppler flow, no suggestion of tamponade physiology.  5. Intact atrial septum.  6. Normal-size  right atrium.  7. Normal-size left atrium.  8. Normal left ventricular cavity size and systolic function.  9. Normal right ventricular cavity size and systolic function.  10. Intact interventricular septum.  __________  [facility] echo [facility]03/2020 ([facility]. Normal right ventricularcavity size and systolic function.  2. Normal size left ventricle.  3. Normal left ventricular systolic function.  4. -Study performed for pericardiocentesis assistance. There is initially a large globally distributed pericardial effusion. Final images demonstrate majority resolution of pericardial effusion status post pericardial drain placement and drainage of pericardial fluid.  Laboratory Data:  Chemistry Recent Labs  Lab 12/09/20 0527 12/10/20 0712  NA 141 135  K 4.5 5.0  CL 115* 110  CO2 18* 19*  GLUCOSE 144* 93  BUN 25* 36*  CREATININE 1.34* 1.76*  CALCIUM 7.8* 7.6*  GFRNONAA 59* 42*  ANIONGAP 8 6    No results for input(s): PROT, ALBUMIN, AST, ALT, ALKPHOS, BILITOT in the last 168 hours. Hematology Recent Labs  Lab 12/09/20 0527 12/10/20 0712  WBC 12.0* 9.3  RBC 2.61* 2.19*  HGB 7.4* 6.2*  HCT 24.2* 19.7*  MCV 92.7 90.0  MCH 28.4 28.3  MCHC 30.6 31.5  RDW 15.8* 15.7*  PLT 303 179   Cardiac EnzymesNo results for input(s): TROPONINI in the last 168 hours. No results for input(s): TROPIPOC in the last 168 hours.  BNP Recent Labs  Lab 12/09/20 0527  BNP >4,500.0*    DDimer  Recent Labs  Lab 12/09/20 0527  DDIMER 0.28    Radiology/Studies:  DG Abd 1 View  Result Date: 12/09/2020 IMPRESSION: 1. Endotracheal and enteric tubes in appropriate position. 2. Persistent diffuse interstitial and airspace opacities. 3. Trace left pleural effusion. 4. Cardiomegaly. 5. Paucity of gas within the visualized upper abdomen. Electronically Signed   By: Iven Finn M.D.   On: 12/09/2020 06:46   DG Chest Portable 1 View  Result Date: 12/09/2020 IMPRESSION: 1. Endotracheal and  enteric tubes in appropriate position. 2. Persistent diffuse interstitial and airspace opacities. 3. Trace left pleural effusion. 4. Cardiomegaly. 5. Paucity of gas within the visualized upper abdomen. Electronically Signed   By: Iven Finn M.D.   On: 12/09/2020 06:46   DG Chest Portable 1 View  Result Date: 12/09/2020 IMPRESSION: Cardiomegaly with diffuse interstitial and airspace opacities. Electronically Signed   By: Iven Finn M.D.   On: 12/09/2020 05:35   Assessment and Plan:   1. Acute HFrEF: -Cardiomyopathy of uncertain etiology -She has undergone numerous echoes over the past month to month and a half at [facility] which have all demonstrated preserved LV SF -Echo this admission shows a new cardiomyopathy with an EF of 25 to 30% -Escalate GDMT when she is able to take oral medications gradually as vital signs and labs allow -Will likely need further cardiac imaging including ischemic evaluation and potential cardiac MRI -Refer to the advanced heart failure service in [place] as an outpatient   2.  Pericardial effusion: -[facility] notes indicate history of pericardial effusion in 08/2016 with recurrent pericarditis/pericardial effusion requiring pericardiocentesis and drain placement in 09/2020 in the context of noncompliance with lupus medications -No evidence of tamponade physiology on echo today -Would consider repeating limited echo in 24 to 48 hours, will discuss with MD  3.  Elevated high-sensitivity troponin: -HS-TN 27 with a delta troponin of 121, continue to cycle until peak -No indication for heparin gtt, especially in the setting of her anemia with a HGB of 6.2 -Echo as above -Will need ischemic evaluation down the road  4.  Acute hypoxic and hypercapnic respiratory failure: -Likely in setting of flash pulmonary edema secondary to hypertensive emergency -Extubated on the morning of 3/17  5.  Profound anemia: -This has been evaluated at [facility] and felt to be in the  setting of her underlying SLE -Consider transfusion to maintain hemoglobin greater than 8 -Management per CCM  6. CKD/lupus nephritis: -PCCM has consulted nephrology    For questions or updates, please contact [place] Please consult [email or web address] for contact info under Cardiology/STEMI.   Signed, Christell Faith,  PA-C [place] Pager: [phone] 12/10/2020, 11:11 AM

## 2020-12-10 NOTE — Progress Notes (Signed)
Neuro:Patient comfortable on sedation of propofol and fentanyl, able to  Resp: stable on ventilator settings CV: TMAX 38.3 tylenol given, environmental adjustments made, blood pressures unstable throughout the day, Nicardipine turned off in the AM, tachycardic throughout the day-MDs aware  GIGU: foley in place, BM x 1, tube feeds started and tolerated well Skin: clean dry and intact Social: Mom at the bedside, all questions and concerns addressed  Events: admitted from the ED, intubated and sedated

## 2020-12-10 NOTE — Progress Notes (Signed)
Pt was suctioned prior to extubation for a small amount of secretions. Per Dr. Zoila Shutter order, she was extubated to room air. SaO2 are 97%. She is coughing and voicing.

## 2020-12-10 NOTE — Progress Notes (Signed)
NAME:  Melinda Steele, MRN:  [id number], DOB:  2001/07/26, LOS: 1 ADMISSION DATE:  12/09/2020,    20 yo female with systemic lupus erythematosus with glomerular disease admitted with acute hypoxic respiratory failure secondary to flash pulmonary edema in the setting of hypertensive emergency requiring mechanical intubation   History of Present Illness:  This is a 20 yo female who presented to [facility] ER on 03/16 via EMS with worsening shortness of breath during the night of 03/16.  Pts mother concerned about possible pericardial effusion due to pts previous hx of this.  Upon review of pts records she was hospitalized at [facility] from 01/8-01/14/2022 due to lupus flare along with pericardial effusion with cardiac tamponade which required pericardiocentesis and drain placement with removal.  She was then admitted again from 10/21/2020-11/12/2020 for PRES, symptomatic edema, and was COVID positive.    Pt has hx of systemic lupus erythematosus with glomerular disease which is managed by [facility].  She is currently on Cytoxan, Hydroxychloroquine, Prednisone, and Rituximab.  She recently received her 5th dose of Cytoxan EL and Rituxmab on 03/14.   ED course Upon arrival to the ER pt noted to have continued respiratory failure and initially placed on 6L O2 via nasal canula.  However, due to increased work of breathing pt placed on Bipap but failed Bipap requiring mechanical intubation.  Bedside ultrasound performed by ER physician which did not reveal pericardial effusion.  CXR concerning for cardiomegaly with diffuse interstitial and airspace opacities.  Lab results revealed chloride 115, CO2 18, BUN 25, creatinine 1.34, troponin 27, wbc 12.0, hgb 7.4, and abg pH 7.28/pCO2 37/pO2 146/acid-base deficit 8.6/bicarb 17.4.  Pt also hypertensive bp 198/12, hr 122, and temp 97.4 F orally.    Past Medical History:       Active Ambulatory Problems    Diagnosis Date Noted  . Systemic lupus erythematosus ([place])  09/18/2019       Resolved Ambulatory Problems    Diagnosis Date Noted  . No Resolved Ambulatory Problems       Past Medical History:  Diagnosis Date  . Hypertension   . Lupus ([place])   . Lupus nephritis ([place])   . Seizure ([place])      Significant Hospital Events:  3/15 admitted for severe resp failure 3/16 multiorgan failure 3/16 BRONCH to assess for PCP, fungal, AFB 3/17 plan for SAT/SBT  Consults:  PCCM  Procedures:  3/15 ETT intubated  Significant Diagnostic Tests:  UDS NEG 3/16 ECHO-Left ventricular ejection fraction, by estimation, is 25 to 30%. The  left ventricle has severely decreased function. The left ventricle  demonstrates global hypokinesis. Left ventricular diastolic parameters are  consistent with Grade II diastolic  dysfunction (pseudonormalization).Moderate pericardial effusion.  Micro Data:  COVID NEG VIRAL PANEL NEG MRSA PCR NEG Blood cultures pending 3/16 resp culture-->RARE WBC PRESENT, PREDOMINANTLY PMN  RARE GRAM NEGATIVE RODS    Interim History / Subjective:  Remains intubated On vent Remains critically ill cxr with persistent b.l infiltrates  Objective   Blood pressure 112/83, pulse 82, temperature 99.5 F (37.5 C), resp. rate 18, height 5' 5.98" (1.676 m), weight 63.7 kg, SpO2 100 %.    Vent Mode: PRVC FiO2 (%):  [30 %-40 %] 30 % Set Rate:  [18 bmp] 18 bmp Vt Set:  [410 mL] 410 mL PEEP:  [5 cmH20] 5 cmH20   Intake/Output Summary (Last 24 hours) at 12/10/2020 0732 Last data filed at 12/10/2020 0413 Gross per 24 hour  Intake 1006.31 ml  Output  1335 ml  Net -328.69 ml   Filed Weights   12/09/20 0525 12/10/20 0426  Weight: 63.5 kg 63.7 kg    REVIEW OF SYSTEMS  PATIENT IS UNABLE TO PROVIDE COMPLETE REVIEW OF SYSTEM S DUE TO SEVERE CRITICAL ILLNESS AND ENCEPHALOPATHY   PHYSICAL EXAMINATION:  GENERAL:critically ill appearing, +resp distress HEAD: Normocephalic, atraumatic.  EYES: Pupils equal, round, reactive  to light.  No scleral icterus.  MOUTH: Moist mucosal membrane. NECK: Supple. No thyromegaly. No nodules. No JVD.  PULMONARY: +rhonchi, +wheezing CARDIOVASCULAR: S1 and S2. Regular rate and rhythm. No murmurs, rubs, or gallops.  GASTROINTESTINAL: Soft, nontender, -distended. Positive bowel sounds.  MUSCULOSKELETAL: No swelling, clubbing, or edema.  NEUROLOGIC: obtunded SKIN:intact,warm,dry     Assessment & Plan:  20 yo female with end stage lupus with h/o previous lung infection and prolonged hosp stay for progressive lupus involving heart, lungs, kidneys,brain) admitted to ICU for severe SOB and severe hypoxic resp failure from acute b/l infiltrates with HTN emergency pulm edema versus atypical infection  Severe ACUTE Hypoxic and Hypercapnic Respiratory Failure -continue Mechanical Ventilator support -continue Bronchodilator Therapy -Wean Fio2 and PEEP as tolerated -VAP/VENT bundle implementation -will perform SAT/SBT when respiratory parameters are met   ACUTE SYSTOLIC CARDIAC FAILURE- ECHO 25% Moderate pericardial effusion. -oxygen as needed -Lasix as tolerated -follow up cardiac enzymes as indicated -follow up cardiology recs  ACUTE KIDNEY INJURY/Renal Failure -continue Foley Catheter-assess need -Avoid nephrotoxic agents -Follow urine output, BMP -Ensure adequate renal perfusion, optimize oxygenation -Renal dose medications    GI GI PROPHYLAXIS as indicated  NUTRITIONAL STATUS DIET-->TF's as tolerated Constipation protocol as indicated  ENDO - ICU hypoglycemic\Hyperglycemia protocol -check FSBS per protocol  ACUTE ANEMIA- TRANSFUSE AS NEEDED DVT PRX with TED/SCD's ONLY TRANSFUSE if HGB<7     DVT/GI PRX ordered and assessed TRANSFUSIONS AS NEEDED MONITOR FSBS I Assessed the need for Labs I Assessed the need for Foley I Assessed the need for Central Venous Line Family Discussion when available I Assessed the need for Mobilization I made an  Assessment of medications to be adjusted accordingly Safety Risk assessment completed  CASE DISCUSSED IN MULTIDISCIPLINARY ROUNDS WITH ICU TEAM   Labs   CBC: Recent Labs  Lab 12/09/20 0527 12/10/20 0712  WBC 12.0* 9.3  NEUTROABS 10.5*  --   HGB 7.4* 6.2*  HCT 24.2* 19.7*  MCV 92.7 90.0  PLT 303 854    Basic Metabolic Panel: Recent Labs  Lab 12/09/20 0527 12/09/20 0728  NA 141  --   K 4.5  --   CL 115*  --   CO2 18*  --   GLUCOSE 144*  --   BUN 25*  --   CREATININE 1.34*  --   CALCIUM 7.8*  --   MG  --  1.9  PHOS  --  6.0*   GFR: Estimated Creatinine Clearance: 63.2 mL/min (A) (by C-G formula based on SCr of 1.34 mg/dL (H)). Recent Labs  Lab 12/09/20 0527 12/09/20 1258 12/10/20 0712  PROCALCITON  --  3.01  --   WBC 12.0*  --  9.3    Liver Function Tests: No results for input(s): AST, ALT, ALKPHOS, BILITOT, PROT, ALBUMIN in the last 168 hours. No results for input(s): LIPASE, AMYLASE in the last 168 hours. No results for input(s): AMMONIA in the last 168 hours.  ABG    Component Value Date/Time   PHART 7.28 (L) 12/09/2020 0537   PCO2ART 37 12/09/2020 0537   PO2ART 146 (H) 12/09/2020 6270  HCO3 17.4 (L) 12/09/2020 0537   ACIDBASEDEF 8.6 (H) 12/09/2020 0537   O2SAT 99.0 12/09/2020 0537     Coagulation Profile: Recent Labs  Lab 12/09/20 0527  INR 1.0    Cardiac Enzymes: No results for input(s): CKTOTAL, CKMB, CKMBINDEX, TROPONINI in the last 168 hours.  HbA1C: No results found for: HGBA1C  CBG: Recent Labs  Lab 12/09/20 0808 12/09/20 1133 12/09/20 1917 12/09/20 2358 12/10/20 0348  GLUCAP 88 72 87 96 93    Allergies Allergies  Allergen Reactions  . Amlodipine Swelling     Home Medications  Prior to Admission medications   Medication Sig Start Date End Date Taking? Authorizing Provider  acetaminophen (TYLENOL) 500 MG tablet Take 500 mg by mouth every 6 (six) hours as needed. 11/11/20 11/11/21 Yes [provider]   ascorbic acid (VITAMIN C) 500 MG tablet Take 500 mg by mouth daily.   Yes [provider]  calcium carbonate (OSCAL) 1500 (600 Ca) MG TABS tablet Take 1,500 mg by mouth daily with breakfast.   Yes [provider]  carvedilol (COREG) 25 MG tablet Take 25 mg by mouth 2 (two) times daily. 11/12/20  Yes [provider]  chlorthalidone (HYGROTON) 25 MG tablet Take 25 mg by mouth 2 (two) times daily. 12/07/20  Yes [provider]  cloNIDine (CATAPRES - DOSED IN MG/24 HR) 0.3 mg/24hr patch Place 0.3 mg onto the skin once a week. 11/17/20  Yes [provider]  ferrous sulfate 325 (65 FE) MG tablet Take 1 tablet by mouth daily. 11/12/20 11/12/21 Yes [provider]  hydroxychloroquine (PLAQUENIL) 200 MG tablet Take 400 mg by mouth daily.   Yes [provider]  levETIRAcetam (KEPPRA) 1000 MG tablet Take 1,000 mg by mouth 2 (two) times daily. 11/12/20  Yes [provider]  lisinopril (ZESTRIL) 5 MG tablet Take 5 mg by mouth at bedtime. 11/12/20  Yes [provider]  magnesium oxide (MAG-OX) 400 MG tablet Take 400 mg by mouth daily. 11/12/20 11/12/21 Yes [provider]  melatonin 3 MG TABS tablet Take 3 mg by mouth at bedtime. 11/11/20 11/11/21 Yes [provider]  NIFEdipine (ADALAT CC) 30 MG 24 hr tablet Take 30 mg by mouth daily. 11/12/20  Yes [provider]  NIFEdipine (PROCARDIA-XL/NIFEDICAL-XL) 30 MG 24 hr tablet Take 1 tablet by mouth every 8 (eight) hours as needed. 11/12/20 11/12/21 Yes [provider]  pantoprazole (PROTONIX) 40 MG tablet Take 40 mg by mouth daily. 11/12/20  Yes [provider]  predniSONE (DELTASONE) 20 MG tablet Take 20 mg by mouth 2 (two) times daily. 12/07/20  Yes [provider]  riboflavin (VITAMIN B-2) 100 MG TABS tablet Take 100 mg by mouth daily. 11/12/20 11/12/21 Yes [provider]  sodium bicarbonate 650 MG tablet Take 650 mg by mouth 2 (two)  times daily. 11/12/20  Yes [provider]  torsemide (DEMADEX) 10 MG tablet Take 30 mg by mouth daily. 11/12/20  Yes [provider]  triamcinolone (KENALOG) 0.1 % Apply topically 2 (two) times daily. 11/13/20  Yes [provider]      Critical Care Time devoted to patient care services described in this note is 55 minutes.   Overall, patient is critically ill, prognosis is guarded.  Patient with Multiorgan failure and at high risk for cardiac arrest and death.    Corrin Parker, M.D.  [facility]  Medical Director [place] Director [facility] Cardio-Pulmonary Department

## 2020-12-10 NOTE — Progress Notes (Signed)
[facility], [place] 12/10/20  Subjective:   Hospital day # 1  Patient is now extubated Denies any acute shortness of breath Blood pressure has been fluctuating No edema Renal: 03/16 0701 - 03/17 0700 In: 1006.3 [I.V.:956.3; NG/GT:50] Out: 1335 [Urine:1310; Emesis/NG output:25] Lab Results  Component Value Date   CREATININE 1.76 (H) 12/10/2020   CREATININE 1.34 (H) 12/09/2020     Objective:  Vital signs in last 24 hours:  Temp:  [97.88 F (36.6 C)-100.94 F (38.3 C)] 99.86 F (37.7 C) (03/17 1040) Pulse Rate:  [82-113] 113 (03/17 1040) Resp:  [15-31] 31 (03/17 1040) BP: (84-123)/(42-91) 123/91 (03/17 1000) SpO2:  [95 %-100 %] 95 % (03/17 1040) FiO2 (%):  [30 %-40 %] 30 % (03/17 0949) Weight:  [63.7 kg] 63.7 kg (03/17 0426)  Weight change: 0.196 kg Filed Weights   12/09/20 0525 12/10/20 0426  Weight: 63.5 kg 63.7 kg    Intake/Output:    Intake/Output Summary (Last 24 hours) at 12/10/2020 1138 Last data filed at 12/10/2020 1000 Gross per 24 hour  Intake 1476.63 ml  Output 994 ml  Net 482.63 ml   Physical Exam: General:  No acute distress, laying in the bed  HEENT  anicteric, moist oral mucous membrane  Pulm/lungs  normal breathing effort, lungs are clear to auscultation  CVS/Heart  regular rhythm, tadchycardic  Abdomen:   Soft, nontender  Extremities:  No peripheral edema  Neurologic:  Alert, oriented, able to follow commands  Skin:  No acute rashes       Basic Metabolic Panel:  Recent Labs  Lab 12/09/20 0527 12/09/20 0728 12/10/20 0712  NA 141  --  135  K 4.5  --  5.0  CL 115*  --  110  CO2 18*  --  19*  GLUCOSE 144*  --  93  BUN 25*  --  36*  CREATININE 1.34*  --  1.76*  CALCIUM 7.8*  --  7.6*  MG  --  1.9 2.1  PHOS  --  6.0* 6.0*     CBC: Recent Labs  Lab 12/09/20 0527 12/10/20 0712  WBC 12.0* 9.3  NEUTROABS 10.5*  --   HGB 7.4* 6.2*  HCT 24.2* 19.7*  MCV 92.7 90.0  PLT 303 179     No results  found for: HEPBSAG, HEPBSAB, HEPBIGM    Microbiology:  Recent Results (from the past 240 hour(s))  Resp Panel by RT-PCR (Flu A&B, Covid) Nasopharyngeal Swab     Status: None   Collection Time: 12/09/20  5:27 AM   Specimen: Nasopharyngeal Swab; Nasopharyngeal(NP) swabs in vial transport medium  Result Value Ref Range Status   SARS Coronavirus 2 by RT PCR NEGATIVE NEGATIVE Final    Comment: (NOTE) SARS-CoV-2 target nucleic acids are NOT DETECTED.  The SARS-CoV-2 RNA is generally detectable in upper respiratory specimens during the acute phase of infection. The lowest concentration of SARS-CoV-2 viral copies this assay can detect is 138 copies/mL. A negative result does not preclude SARS-Cov-2 infection and should not be used as the sole basis for treatment or other patient management decisions. A negative result may occur with  improper specimen collection/handling, submission of specimen other than nasopharyngeal swab, presence of viral mutation(s) within the areas targeted by this assay, and inadequate number of viral copies(<138 copies/mL). A negative result must be combined with clinical observations, patient history, and epidemiological information. The expected result is Negative.  Fact Sheet for Patients:  EntrepreneurPulse.com.au  Fact Sheet for Healthcare Providers:  IncredibleEmployment.be  This test is no t yet approved or cleared by the [place] and  has been authorized for detection and/or diagnosis of SARS-CoV-2 by FDA under an Emergency Use Authorization (EUA). This EUA will remain  in effect (meaning this test can be used) for the duration of the COVID-19 declaration under Section 564(b)(1) of the Act, 21 U.S.C.section 360bbb-3(b)(1), unless the authorization is terminated  or revoked sooner.       Influenza A by PCR NEGATIVE NEGATIVE Final   Influenza B by PCR NEGATIVE NEGATIVE Final    Comment: (NOTE) The Xpert  Xpress SARS-CoV-2/FLU/RSV plus assay is intended as an aid in the diagnosis of influenza from Nasopharyngeal swab specimens and should not be used as a sole basis for treatment. Nasal washings and aspirates are unacceptable for Xpert Xpress SARS-CoV-2/FLU/RSV testing.  Fact Sheet for Patients: EntrepreneurPulse.com.au  Fact Sheet for Healthcare Providers: IncredibleEmployment.be  This test is not yet approved or cleared by the [place] FDA and has been authorized for detection and/or diagnosis of SARS-CoV-2 by FDA under an Emergency Use Authorization (EUA). This EUA will remain in effect (meaning this test can be used) for the duration of the COVID-19 declaration under Section 564(b)(1) of the Act, 21 U.S.C. section 360bbb-3(b)(1), unless the authorization is terminated or revoked.  Performed at [facility], [address] [place], [place] [address]   Expectorated Sputum Assessment w Gram Stain, Rflx to Resp Cult     Status: None   Collection Time: 12/09/20 11:17 AM   Specimen: Tracheal Aspirate; Sputum  Result Value Ref Range Status   Specimen Description TRACHEAL ASPIRATE  Final   Special Requests Immunocompromised  Final   Sputum evaluation   Final    THIS SPECIMEN IS ACCEPTABLE FOR SPUTUM CULTURE Performed at [facility], [address], [place], [place] [address]    Report Status 12/09/2020 FINAL  Final  Culture, fungus without smear     Status: None (Preliminary result)   Collection Time: 12/09/20 11:17 AM   Specimen: Tracheal Aspirate; Other  Result Value Ref Range Status   Specimen Description   Final    TRACHEAL ASPIRATE Performed at [facility], [address], [place], [place] [address]    Special Requests   Final    Immunocompromised Performed at [facility], [address], [place], [place] [address]    Culture   Final    NO FUNGUS ISOLATED AFTER 1 DAY Performed at [facility], [place] [address], [place], [place] [address]    Report Status PENDING  Incomplete  Culture, Respiratory w Gram Stain     Status: None (Preliminary result)   Collection Time: 12/09/20 11:17 AM   Specimen: Tracheal Aspirate  Result Value Ref Range Status   Specimen Description   Final    TRACHEAL ASPIRATE Performed at [facility], [address] [place], [place] [address]    Special Requests   Final    Immunocompromised Reflexed from [phone] Performed at [facility], [address] [place], [place] [address]    Gram Stain   Final    RARE WBC PRESENT, PREDOMINANTLY PMN RARE GRAM NEGATIVE RODS    Culture   Final    CULTURE REINCUBATED FOR BETTER GROWTH Performed at [facility], [place] [address], [place], [place] [address]    Report Status PENDING  Incomplete  MRSA PCR Screening     Status: None   Collection Time: 12/09/20 11:25  AM   Specimen: Nasal Mucosa; Nasopharyngeal  Result Value Ref Range Status   MRSA by PCR NEGATIVE NEGATIVE Final    Comment:        The GeneXpert MRSA Assay (FDA approved for NASAL specimens only), is one component of a comprehensive MRSA colonization surveillance program. It is not intended to diagnose MRSA infection nor to guide or monitor treatment for MRSA infections. Performed at [facility], [address] [place], [place] [address]     Coagulation Studies: Recent Labs    12/09/20 0527  LABPROT 12.2  INR 1.0    Urinalysis: No results for input(s): COLORURINE, LABSPEC, PHURINE, GLUCOSEU, HGBUR, BILIRUBINUR, KETONESUR, PROTEINUR, UROBILINOGEN, NITRITE, LEUKOCYTESUR in the last 72 hours.  Invalid input(s): APPERANCEUR    Imaging: DG Abd 1 View  Result Date: 12/09/2020 CLINICAL DATA:  Intubation. And does tracheal and orogastric tube placement. EXAM: ABDOMEN - 1 VIEW; PORTABLE CHEST - 1 VIEW CHEST - 1  VIEW AP SEMI_ERECT COMPARISON:  Chest x-ray 12/09/2020 5:35 a.m. FINDINGS:  Endotracheal tube with tip terminating 4.5 cm above the carina. Enteric tube coursing below the hemidiaphragm with tip and side port overlying the expected region of the gastric lumen. Enlarged cardiac silhouette. The heart size and mediastinal contours are otherwise unremarkable. Persistent diffuse patchy interstitial and airspace opacities. No pulmonary edema. Trace left pleural effusion. No pneumothorax. No acute osseous abnormality. Paucity of gas within the visualized upper abdomen. No radio-opaque calculi or other significant radiographic abnormality are seen. IMPRESSION: 1. Endotracheal and enteric tubes in appropriate position. 2. Persistent diffuse interstitial and airspace opacities. 3. Trace left pleural effusion. 4. Cardiomegaly. 5. Paucity of gas within the visualized upper abdomen. Electronically Signed   By: Iven Finn M.D.   On: 12/09/2020 06:46   DG Chest Portable 1 View  Result Date: 12/09/2020 CLINICAL DATA:  Intubation. And does tracheal and orogastric tube placement. EXAM: ABDOMEN - 1 VIEW; PORTABLE CHEST - 1 VIEW CHEST - 1  VIEW AP SEMI_ERECT COMPARISON:  Chest x-ray 12/09/2020 5:35 a.m. FINDINGS: Endotracheal tube with tip terminating 4.5 cm above the carina. Enteric tube coursing below the hemidiaphragm with tip and side port overlying the expected region of the gastric lumen. Enlarged cardiac silhouette. The heart size and mediastinal contours are otherwise unremarkable. Persistent diffuse patchy interstitial and airspace opacities. No pulmonary edema. Trace left pleural effusion. No pneumothorax. No acute osseous abnormality. Paucity of gas within the visualized upper abdomen. No radio-opaque calculi or other significant radiographic abnormality are seen. IMPRESSION: 1. Endotracheal and enteric tubes in appropriate position. 2. Persistent diffuse interstitial and airspace opacities. 3. Trace left pleural effusion. 4. Cardiomegaly. 5. Paucity of gas within the visualized upper  abdomen. Electronically Signed   By: Iven Finn M.D.   On: 12/09/2020 06:46   DG Chest Portable 1 View  Result Date: 12/09/2020 CLINICAL DATA:  Shortness of breath. EXAM: PORTABLE CHEST 1 VIEW COMPARISON:  None. FINDINGS: Enlarged cardiac silhouette the heart size and mediastinal contours are otherwise unremarkable. Diffuse interstitial and airspace opacities. No pulmonary edema. No pleural effusion. No pneumothorax. No acute osseous abnormality. IMPRESSION: Cardiomegaly with diffuse interstitial and airspace opacities. Electronically Signed   By: Iven Finn M.D.   On: 12/09/2020 05:35   ECHOCARDIOGRAM COMPLETE  Result Date: 12/09/2020    ECHOCARDIOGRAM REPORT   Patient Name:   [facility] Date of Exam: 12/09/2020 Medical Rec #:  [id number]    Height:       66.0 in Accession #:    [id number]  Weight:       140.0 lb Date of Birth:  May 19, 2001    BSA:          1.719 m Patient Age:    19 years     BP:           116/79 mmHg Patient Gender: F            HR:           120 bpm. Exam Location:  [facility] Procedure: 2D Echo, Color Doppler, Cardiac Doppler and Strain Analysis STAT ECHO Indications:     R06.03 Acute respiratory distress  History:         Patient has no prior history of Echocardiogram examinations.                  Lupus; Risk Factors:Hypertension.  Sonographer:     Charmayne Sheer RDCS (AE) Referring Phys:  [id number] Awilda Bill Diagnosing Phys: Kate Sable MD  Sonographer Comments: Global longitudinal strain was attempted. IMPRESSIONS  1. Left ventricular ejection fraction, by estimation, is 25 to 30%. The left ventricle has severely decreased function. The left ventricle demonstrates global hypokinesis. Left ventricular diastolic parameters are consistent with Grade II diastolic dysfunction (pseudonormalization).  2. Right ventricular systolic function is normal. The right ventricular size is normal.  3. Moderate pericardial effusion.  4. The mitral valve is normal in structure. No evidence  of mitral valve regurgitation.  5. The aortic valve is normal in structure. Aortic valve regurgitation is not visualized.  6. The inferior vena cava is normal in size with <50% respiratory variability, suggesting right atrial pressure of 8 mmHg. FINDINGS  Left Ventricle: Left ventricular ejection fraction, by estimation, is 25 to 30%. The left ventricle has severely decreased function. The left ventricle demonstrates global hypokinesis. The left ventricular internal cavity size was normal in size. There is no left ventricular hypertrophy. Left ventricular diastolic parameters are consistent with Grade II diastolic dysfunction (pseudonormalization). Right Ventricle: The right ventricular size is normal. No increase in right ventricular wall thickness. Right ventricular systolic function is normal. Left Atrium: Left atrial size was normal in size. Right Atrium: Right atrial size was normal in size. Pericardium: A moderately sized pericardial effusion is present. Mitral Valve: The mitral valve is normal in structure. No evidence of mitral valve regurgitation. Tricuspid Valve: The tricuspid valve is normal in structure. Tricuspid valve regurgitation is not demonstrated. Aortic Valve: The aortic valve is normal in structure. Aortic valve regurgitation is not visualized. Aortic valve mean gradient measures 3.0 mmHg. Aortic valve peak gradient measures 5.9 mmHg. Aortic valve area, by VTI measures 3.33 cm. Pulmonic Valve: The pulmonic valve was normal in structure. Pulmonic valve regurgitation is trivial. Aorta: The aortic root is normal in size and structure. Venous: The inferior vena cava is normal in size with less than 50% respiratory variability, suggesting right atrial pressure of 8 mmHg. IAS/Shunts: No atrial level shunt detected by color flow Doppler.  LEFT VENTRICLE PLAX 2D LVIDd:         4.90 cm  Diastology LVIDs:         3.80 cm  LV e' medial:    4.57 cm/s LV PW:         1.10 cm  LV E/e' medial:  20.7 LV IVS:         0.90 cm  LV e' lateral:   8.38 cm/s LVOT diam:     2.40 cm  LV E/e' lateral: 11.3 LV SV:  57 LV SV Index:   33 LVOT Area:     4.52 cm  RIGHT VENTRICLE RV Basal diam:  3.50 cm LEFT ATRIUM             Index       RIGHT ATRIUM           Index LA diam:        3.30 cm 1.92 cm/m  RA Area:     14.20 cm LA Vol (A2C):   38.1 ml 22.17 ml/m RA Volume:   34.10 ml  19.84 ml/m LA Vol (A4C):   51.2 ml 29.79 ml/m LA Biplane Vol: 43.8 ml 25.49 ml/m  AORTIC VALVE                   PULMONIC VALVE AV Area (Vmax):    3.61 cm    PV Vmax:       0.86 m/s AV Area (Vmean):   3.63 cm    PV Vmean:      55.400 cm/s AV Area (VTI):     3.33 cm    PV VTI:        0.145 m AV Vmax:           121.00 cm/s PV Peak grad:  3.0 mmHg AV Vmean:          79.300 cm/s PV Mean grad:  1.0 mmHg AV VTI:            0.170 m AV Peak Grad:      5.9 mmHg AV Mean Grad:      3.0 mmHg LVOT Vmax:         96.60 cm/s LVOT Vmean:        63.600 cm/s LVOT VTI:          0.125 m LVOT/AV VTI ratio: 0.74  AORTA Ao Root diam: 2.80 cm MITRAL VALVE MV Area (PHT): 8.82 cm    SHUNTS MV Decel Time: 86 msec     Systemic VTI:  0.12 m MV E velocity: 94.70 cm/s  Systemic Diam: 2.40 cm MV A velocity: 63.80 cm/s MV E/A ratio:  1.48 Debbe Odea MD Electronically signed by Debbe Odea MD Signature Date/Time: 12/09/2020/1:19:17 PM    Final      Medications:   . ampicillin-sulbactam (UNASYN) IV    . fentaNYL infusion INTRAVENOUS 75 mcg/hr (12/10/20 1000)  . niCARDipine Stopped (12/09/20 1056)  . propofol (DIPRIVAN) infusion 20 mcg/kg/min (12/10/20 1000)   . chlorhexidine gluconate (MEDLINE KIT)  15 mL Mouth Rinse BID  . Chlorhexidine Gluconate Cloth  6 each Topical Daily  . heparin  5,000 Units Subcutaneous Q8H  . levETIRAcetam  1,000 mg Per Tube BID  . mouth rinse  15 mL Mouth Rinse 10 times per day  . multivitamin  15 mL Per Tube Daily  . ondansetron (ZOFRAN) IV  4 mg Intravenous Once  . pantoprazole sodium  40 mg Per Tube Daily  . predniSONE   20 mg Per Tube BID WC  . sodium chloride flush  10-40 mL Intracatheter Q12H   acetaminophen, docusate sodium, LORazepam, ondansetron (ZOFRAN) IV, polyethylene glycol, sodium chloride flush  Assessment/ Plan:  20 y.o. female with medical problems of SLE, lupus nephritis, hypertension, multiple complications of lupus, status epilepticus, press syndrome, polyarthritis, pericardial effusion admitted on 12/09/2020 for Acute pulmonary edema (HCC) [J81.0] Respiratory distress [R06.03] Encounter for orogastric (OG) tube placement [Z46.59] Acute respiratory failure with hypoxia (HCC) [J96.01] Hypertensive emergency [I16.1]  #Complicated SLE with Class III lupus  nephritis (Dx 07/2016) Diagnosed in November 2017.  Patient has had multiple complications and hospitalizations related to lupus as documented in care everywhere records. Most recently patient is being treated with cyclophosphamide and rituximab IV.  Last infusion was given this past Monday. In addition she is getting prednisone and Plaquenil  #Acute kidney injury -Urinalysis, urine protein creatinine ratio, complements are pending -Most likely increasing creatinine is secondary to hypertensive injury and volume shifts  # Acute respiratory failure Extubated now  #Severe hypertension -Avoid Hypotension -Consider starting oral scheduled labetalol     LOS: 1 Brylan Dec 3/17/202211:38 AM  [facility] [place], [place]  Note: This note was prepared with Dragon dictation. Any transcription errors are unintentional

## 2020-12-10 NOTE — Discharge Summary (Signed)
Physician Discharge Summary  Patient ID: Melinda Steele MRN: [id number] DOB/AGE: [birth year] 20 y.o.  Admit date: 12/09/2020 Discharge date: 12/10/2020  Admission [place] ACUTE CHF EXACERBATIOBN  Discharge Diagnoses: acute cardiac failure Active Problems:   Acute respiratory failure with hypoxia ([place])  20 yo female with systemic lupus erythematosus with glomerular disease admitted with acute hypoxic respiratory failure secondary to flash pulmonary edema in the setting of hypertensive emergency requiring mechanical intubation  History of Present Illness:  This is a 20 yo female who presented to [facility] ER on 03/16 via EMS with worsening shortness of breath during the night of 03/16. Pts mother concerned about possible pericardial effusion due to pts previous hx of this. Upon review of pts records she was hospitalized at [facility] from 01/8-01/14/2022 due to lupus flare along with pericardial effusion with cardiac tamponade whichrequired pericardiocentesis and drain placement with removal. She was then admitted again from 10/21/2020-11/12/2020 for PRES, symptomatic edema, and was COVID positive.   Pt has hx of systemic lupus erythematosus with glomerular disease which is managed by [facility]. She is currently on Cytoxan, Hydroxychloroquine, Prednisone, and Rituximab. She recently received her 5th dose of Cytoxan EL and Rituxmab on 03/14.   ED course Upon arrival to the ER pt noted to have continued respiratory failure and initially placed on 6L O2 via nasal canula. However, due to increased work of breathing pt placed on Bipap but failed Bipap requiring mechanical intubation. Bedside ultrasound performed by ER physician which did not reveal pericardial effusion. CXR concerning for cardiomegaly with diffuse interstitial and airspace opacities. Lab results revealed chloride 115, CO2 18, BUN 25, creatinine 1.34, troponin 27, wbc 12.0, hgb 7.4, and abg pH 7.28/pCO2 37/pO2  146/acid-base deficit 8.6/bicarb 17.4. Pt also hypertensive bp 198/12, hr 122, and temp 97.4 F orally.   Past Medical History:       Active Ambulatory Problems   Diagnosis Date Noted  . Systemic lupus erythematosus ([place]) 09/18/2019       Resolved Ambulatory Problems   Diagnosis Date Noted  . No Resolved Ambulatory Problems       Past Medical History:  Diagnosis Date  . Hypertension   . Lupus ([place])   . Lupus nephritis ([place])   . Seizure ([place])      Significant Hospital Events:  3/15 admitted for severe resp failure 3/16 multiorgan failure 3/16 BRONCH to assess for PCP, fungal, AFB 3/17 plan for SAT/SBT-PATIENT SUCCESSFULLY EXTUBATED  Consults:  PCCM  Procedures:  3/15 ETT intubated  Significant Diagnostic Tests:  UDS NEG 3/16 ECHO-Left ventricular ejection fraction, by estimation, is 25 to 30%. The  left ventricle has severely decreased function. The left ventricle  demonstrates global hypokinesis. Left ventricular diastolic parameters are  consistent with Grade II diastolic  dysfunction (pseudonormalization).Moderate pericardial effusion.  Micro Data:  COVID NEG VIRAL PANEL NEG MRSA PCR NEG Blood cultures pending 3/16 resp culture-->RARE WBC PRESENT, PREDOMINANTLY PMN  RARE GRAM NEGATIVE RODS    Discharged Condition: critical  Hospital Course: Admitted to ICU Patient successfully extubated  Consults:CARDIOLOGY, NEPHROLOGY  Significant Diagnostic Studies:  ECHO 1. Left ventricular ejection fraction, by estimation, is 25 to 30%. The  left ventricle has severely decreased function. The left ventricle  demonstrates global hypokinesis. Left ventricular diastolic parameters are  consistent with Grade II diastolic  dysfunction (pseudonormalization).  2. Right ventricular systolic function is normal. The right ventricular  size is normal.  3. Moderate pericardial effusion.   Treatments:  IV LASIX IV NICARDIPINE IV  ABX  Discharge Exam: Blood pressure (!) 142/102, pulse (!) 129, temperature 100.04 F (37.8 C), resp. rate (!) 23, height 5' 5.98" (1.676 m), weight 63.7 kg, SpO2 96 %.  Physical Examination:   General Appearance: No distress  Neuro:without focal findings,  speech normal,  HEENT: PERRLA, EOM intact.   Pulmonary: normal breath sounds, No wheezing.  CardiovascularNormal S1,S2.  No m/r/g.   Abdomen: Benign, Soft, non-tender. Renal:  No costovertebral tenderness  GU:  Not performed at this time. Endoc: No evident thyromegaly Skin:   warm, no rashes, no ecchymosis  Extremities: normal, no cyanosis, clubbing. PSYCHIATRIC: Mood, affect within normal limits.   ALL OTHER ROS ARE NEGATIVE  Disposition: PEDIATRIC ICU AT [facility]   Allergies as of 12/10/2020      Reactions   Amlodipine Swelling      Medication List    STOP taking these medications   acetaminophen 500 MG tablet Commonly known as: TYLENOL   ascorbic acid 500 MG tablet Commonly known as: VITAMIN C   calcium carbonate 1500 (600 Ca) MG Tabs tablet Commonly known as: OSCAL   carvedilol 25 MG tablet Commonly known as: COREG   chlorthalidone 25 MG tablet Commonly known as: HYGROTON   ferrous sulfate 325 (65 FE) MG tablet   hydroxychloroquine 200 MG tablet Commonly known as: PLAQUENIL   levETIRAcetam 1000 MG tablet Commonly known as: KEPPRA   lisinopril 5 MG tablet Commonly known as: ZESTRIL   magnesium oxide 400 MG tablet Commonly known as: MAG-OX   melatonin 3 MG Tabs tablet   NIFEdipine 30 MG 24 hr tablet Commonly known as: PROCARDIA-XL/NIFEDICAL-XL   pantoprazole 40 MG tablet Commonly known as: PROTONIX   predniSONE 20 MG tablet Commonly known as: DELTASONE   riboflavin 100 MG Tabs tablet Commonly known as: VITAMIN B-2   sodium bicarbonate 650 MG tablet   torsemide 10 MG tablet Commonly known as: DEMADEX   triamcinolone 0.1 % Commonly known as: KENALOG     TAKE these medications    Ampicillin-Sulbactam 3 g in sodium chloride 0.9 % 100 mL Inject 3 g into the vein every 6 (six) hours.   cloNIDine 0.3 mg/24hr patch Commonly known as: CATAPRES - Dosed in mg/24 hr Place 1 patch (0.3 mg total) onto the skin once a week.            Discharge Care Instructions  (From admission, onward)         Start     Ordered   12/10/20 0000  Change dressing on IV access line weekly and PRN  (Home infusion instructions - [facility] )        12/10/20 1547           Signed: Flora Lipps 12/10/2020, 3:45 PM

## 2020-12-10 NOTE — Progress Notes (Signed)
Neuro: alert and oriented, stable at baseline, anxious regarding new results and future care plan. Resp: stable on room air following extubation this morning, encouraged to cough and deep breathe CV: afebrile, hypertensive this afternoon with tachycardia, labetalol given x 1 without results, 0.5 Ativan given for anxiety/HTN-no changes GIGU: tube feeds tolerated-stopped with extubated, foley in place and tolerated, emesis x1-zofran given with improvement, tolerating sips of water and ice cream Skin: clean dry and intact Social: Mom at the bedside this afternoon, patient is anxious and intermittently tearful upon receiving update of testing results  Events: Successfully extubated to room air. Transfer to [facility] PICU

## 2020-12-10 NOTE — Progress Notes (Signed)
PHARMACY CONSULT NOTE - FOLLOW UP  Pharmacy Consult for Electrolyte Monitoring and Replacement   Recent Labs: Potassium (mmol/L)  Date Value  12/10/2020 5.0   Magnesium (mg/dL)  Date Value  12/10/2020 2.1   Calcium (mg/dL)  Date Value  12/10/2020 7.6 (L)   Phosphorus (mg/dL)  Date Value  12/10/2020 6.0 (H)   Sodium (mmol/L)  Date Value  12/10/2020 135    Assessment: 20 yo F presented with respiratory distress and hypertension. Pt intubated 3/16>3/17. PMH includes lupus nephritis, HTN, and seizures. Pharmacy consulted to monitor and replace electrolytes.   Goal of Therapy:  Electrolytes WNL  Plan:  --No electrolyte supplementation needed at this time --Monitor electrolytes with AM labs  Benn Moulder, PharmD Pharmacy Resident  12/10/2020 11:36 AM

## 2020-12-11 LAB — CULTURE, RESPIRATORY W GRAM STAIN: Culture: NORMAL

## 2020-12-11 LAB — PNEUMOCYSTIS JIROVECI SMEAR BY DFA: Pneumocystis jiroveci Ag: NEGATIVE

## 2020-12-11 LAB — C3 COMPLEMENT: C3 Complement: 80 mg/dL — ABNORMAL LOW (ref 82–167)

## 2020-12-11 LAB — C4 COMPLEMENT: Complement C4, Body Fluid: 17 mg/dL (ref 12–38)

## 2020-12-30 LAB — CULTURE, FUNGUS WITHOUT SMEAR

## 2021-01-22 LAB — ACID FAST CULTURE WITH REFLEXED SENSITIVITIES (MYCOBACTERIA): Acid Fast Culture: NEGATIVE

## 2021-02-13 DIAGNOSIS — L732 Hidradenitis suppurativa: Secondary | ICD-10-CM | POA: Insufficient documentation

## 2021-08-21 ENCOUNTER — Other Ambulatory Visit: Payer: Self-pay

## 2021-08-21 ENCOUNTER — Emergency Department: Payer: Medicaid Other

## 2021-08-21 ENCOUNTER — Inpatient Hospital Stay
Admit: 2021-08-21 | Discharge: 2021-08-21 | Disposition: A | Discharge status: Home / Self Care | Payer: Medicaid Other | Attending: Pulmonary Disease | Admitting: Pulmonary Disease

## 2021-08-21 ENCOUNTER — Inpatient Hospital Stay: Payer: Medicaid Other

## 2021-08-21 ENCOUNTER — Inpatient Hospital Stay
Admission: EM | Admit: 2021-08-21 | Discharge: 2021-08-28 | DRG: 853 | Disposition: A | Discharge status: Other Acute Inpatient Hospital | Payer: Medicaid Other | Attending: Internal Medicine | Admitting: Internal Medicine

## 2021-08-21 DIAGNOSIS — R140 Abdominal distension (gaseous): Secondary | ICD-10-CM

## 2021-08-21 DIAGNOSIS — I248 Other forms of acute ischemic heart disease: Secondary | ICD-10-CM | POA: Diagnosis present

## 2021-08-21 DIAGNOSIS — D631 Anemia in chronic kidney disease: Secondary | ICD-10-CM | POA: Diagnosis present

## 2021-08-21 DIAGNOSIS — Z7960 Long term (current) use of unspecified immunomodulators and immunosuppressants: Secondary | ICD-10-CM

## 2021-08-21 DIAGNOSIS — D591 Autoimmune hemolytic anemia, unspecified: Secondary | ICD-10-CM | POA: Diagnosis present

## 2021-08-21 DIAGNOSIS — Z79899 Other long term (current) drug therapy: Secondary | ICD-10-CM

## 2021-08-21 DIAGNOSIS — J690 Pneumonitis due to inhalation of food and vomit: Secondary | ICD-10-CM | POA: Diagnosis present

## 2021-08-21 DIAGNOSIS — E162 Hypoglycemia, unspecified: Secondary | ICD-10-CM | POA: Diagnosis present

## 2021-08-21 DIAGNOSIS — T426X6A Underdosing of other antiepileptic and sedative-hypnotic drugs, initial encounter: Secondary | ICD-10-CM | POA: Diagnosis present

## 2021-08-21 DIAGNOSIS — A419 Sepsis, unspecified organism: Principal | ICD-10-CM | POA: Diagnosis present

## 2021-08-21 DIAGNOSIS — N186 End stage renal disease: Secondary | ICD-10-CM | POA: Diagnosis not present

## 2021-08-21 DIAGNOSIS — N2581 Secondary hyperparathyroidism of renal origin: Secondary | ICD-10-CM | POA: Diagnosis present

## 2021-08-21 DIAGNOSIS — R569 Unspecified convulsions: Secondary | ICD-10-CM | POA: Diagnosis present

## 2021-08-21 DIAGNOSIS — I10 Essential (primary) hypertension: Secondary | ICD-10-CM | POA: Diagnosis present

## 2021-08-21 DIAGNOSIS — G40401 Other generalized epilepsy and epileptic syndromes, not intractable, with status epilepticus: Secondary | ICD-10-CM | POA: Diagnosis present

## 2021-08-21 DIAGNOSIS — Z8616 Personal history of COVID-19: Secondary | ICD-10-CM | POA: Diagnosis not present

## 2021-08-21 DIAGNOSIS — I5023 Acute on chronic systolic (congestive) heart failure: Secondary | ICD-10-CM | POA: Diagnosis not present

## 2021-08-21 DIAGNOSIS — D589 Hereditary hemolytic anemia, unspecified: Secondary | ICD-10-CM | POA: Diagnosis not present

## 2021-08-21 DIAGNOSIS — J9601 Acute respiratory failure with hypoxia: Secondary | ICD-10-CM | POA: Diagnosis present

## 2021-08-21 DIAGNOSIS — R6520 Severe sepsis without septic shock: Secondary | ICD-10-CM | POA: Diagnosis present

## 2021-08-21 DIAGNOSIS — M329 Systemic lupus erythematosus, unspecified: Secondary | ICD-10-CM | POA: Diagnosis not present

## 2021-08-21 DIAGNOSIS — E875 Hyperkalemia: Secondary | ICD-10-CM | POA: Diagnosis not present

## 2021-08-21 DIAGNOSIS — N185 Chronic kidney disease, stage 5: Secondary | ICD-10-CM | POA: Diagnosis not present

## 2021-08-21 DIAGNOSIS — I3139 Other pericardial effusion (noninflammatory): Secondary | ICD-10-CM | POA: Diagnosis present

## 2021-08-21 DIAGNOSIS — Z91138 Patient's unintentional underdosing of medication regimen for other reason: Secondary | ICD-10-CM | POA: Diagnosis not present

## 2021-08-21 DIAGNOSIS — I420 Dilated cardiomyopathy: Secondary | ICD-10-CM | POA: Diagnosis present

## 2021-08-21 DIAGNOSIS — D6959 Other secondary thrombocytopenia: Secondary | ICD-10-CM | POA: Diagnosis present

## 2021-08-21 DIAGNOSIS — J189 Pneumonia, unspecified organism: Secondary | ICD-10-CM | POA: Diagnosis present

## 2021-08-21 DIAGNOSIS — N1832 Chronic kidney disease, stage 3b: Secondary | ICD-10-CM | POA: Diagnosis not present

## 2021-08-21 DIAGNOSIS — E872 Acidosis, unspecified: Secondary | ICD-10-CM

## 2021-08-21 DIAGNOSIS — E785 Hyperlipidemia, unspecified: Secondary | ICD-10-CM | POA: Diagnosis present

## 2021-08-21 DIAGNOSIS — M3214 Glomerular disease in systemic lupus erythematosus: Secondary | ICD-10-CM | POA: Diagnosis present

## 2021-08-21 DIAGNOSIS — I5022 Chronic systolic (congestive) heart failure: Secondary | ICD-10-CM

## 2021-08-21 DIAGNOSIS — N179 Acute kidney failure, unspecified: Secondary | ICD-10-CM | POA: Diagnosis present

## 2021-08-21 DIAGNOSIS — N184 Chronic kidney disease, stage 4 (severe): Secondary | ICD-10-CM | POA: Diagnosis present

## 2021-08-21 DIAGNOSIS — Z992 Dependence on renal dialysis: Secondary | ICD-10-CM | POA: Diagnosis not present

## 2021-08-21 LAB — CBC WITH DIFFERENTIAL/PLATELET
Abs Immature Granulocytes: 0.13 10*3/uL — ABNORMAL HIGH (ref 0.00–0.07)
Basophils Absolute: 0 10*3/uL (ref 0.0–0.1)
Basophils Relative: 0 %
Eosinophils Absolute: 0 10*3/uL (ref 0.0–0.5)
Eosinophils Relative: 0 %
HCT: 23.4 % — ABNORMAL LOW (ref 36.0–46.0)
Hemoglobin: 7.4 g/dL — ABNORMAL LOW (ref 12.0–15.0)
Immature Granulocytes: 1 %
Lymphocytes Relative: 4 %
Lymphs Abs: 0.8 10*3/uL (ref 0.7–4.0)
MCH: 27.7 pg (ref 26.0–34.0)
MCHC: 31.6 g/dL (ref 30.0–36.0)
MCV: 87.6 fL (ref 80.0–100.0)
Monocytes Absolute: 0.6 10*3/uL (ref 0.1–1.0)
Monocytes Relative: 3 %
Neutro Abs: 18.5 10*3/uL — ABNORMAL HIGH (ref 1.7–7.7)
Neutrophils Relative %: 92 %
Platelets: 188 10*3/uL (ref 150–400)
RBC: 2.67 MIL/uL — ABNORMAL LOW (ref 3.87–5.11)
RDW: 13 % (ref 11.5–15.5)
WBC: 20.1 10*3/uL — ABNORMAL HIGH (ref 4.0–10.5)
nRBC: 0 % (ref 0.0–0.2)

## 2021-08-21 LAB — CBC
HCT: 20.1 % — ABNORMAL LOW (ref 36.0–46.0)
Hemoglobin: 6.3 g/dL — ABNORMAL LOW (ref 12.0–15.0)
MCH: 27.3 pg (ref 26.0–34.0)
MCHC: 31.3 g/dL (ref 30.0–36.0)
MCV: 87 fL (ref 80.0–100.0)
Platelets: 128 10*3/uL — ABNORMAL LOW (ref 150–400)
RBC: 2.31 MIL/uL — ABNORMAL LOW (ref 3.87–5.11)
RDW: 13.1 % (ref 11.5–15.5)
WBC: 13.6 10*3/uL — ABNORMAL HIGH (ref 4.0–10.5)
nRBC: 0 % (ref 0.0–0.2)

## 2021-08-21 LAB — LIPID PANEL
Cholesterol: 129 mg/dL (ref 0–200)
HDL: 42 mg/dL (ref >40)
LDL Cholesterol: 67 mg/dL (ref 0–99)
Total CHOL/HDL Ratio: 3.1 RATIO
Triglycerides: 102 mg/dL (ref <150)
VLDL: 20 mg/dL (ref 0–40)

## 2021-08-21 LAB — URINALYSIS, COMPLETE (UACMP) WITH MICROSCOPIC
Bilirubin Urine: NEGATIVE
Glucose, UA: 100 mg/dL — AB
Ketones, ur: NEGATIVE mg/dL
Nitrite: NEGATIVE
Protein, ur: >300 mg/dL — AB
Specific Gravity, Urine: 1.02 (ref 1.005–1.030)
pH: 8.5 — ABNORMAL HIGH (ref 5.0–8.0)

## 2021-08-21 LAB — BASIC METABOLIC PANEL
Anion gap: 8 (ref 5–15)
BUN: 88 mg/dL — ABNORMAL HIGH (ref 6–20)
CO2: 19 mmol/L — ABNORMAL LOW (ref 22–32)
Calcium: 7.3 mg/dL — ABNORMAL LOW (ref 8.9–10.3)
Chloride: 112 mmol/L — ABNORMAL HIGH (ref 98–111)
Creatinine, Ser: 7.9 mg/dL — ABNORMAL HIGH (ref 0.44–1.00)
GFR, Estimated: 7 mL/min — ABNORMAL LOW (ref >60)
Glucose, Bld: 157 mg/dL — ABNORMAL HIGH (ref 70–99)
Potassium: 5.1 mmol/L (ref 3.5–5.1)
Sodium: 139 mmol/L (ref 135–145)

## 2021-08-21 LAB — PROCALCITONIN
Procalcitonin: 32.25 ng/mL
Procalcitonin: >150 ng/mL

## 2021-08-21 LAB — COMPREHENSIVE METABOLIC PANEL
ALT: 15 U/L (ref 0–44)
AST: 20 U/L (ref 15–41)
Albumin: 3.2 g/dL — ABNORMAL LOW (ref 3.5–5.0)
Alkaline Phosphatase: 43 U/L (ref 38–126)
Anion gap: 10 (ref 5–15)
BUN: 86 mg/dL — ABNORMAL HIGH (ref 6–20)
CO2: 17 mmol/L — ABNORMAL LOW (ref 22–32)
Calcium: 7.7 mg/dL — ABNORMAL LOW (ref 8.9–10.3)
Chloride: 110 mmol/L (ref 98–111)
Creatinine, Ser: 7.89 mg/dL — ABNORMAL HIGH (ref 0.44–1.00)
GFR, Estimated: 7 mL/min — ABNORMAL LOW (ref >60)
Glucose, Bld: 80 mg/dL (ref 70–99)
Potassium: 5.1 mmol/L (ref 3.5–5.1)
Sodium: 137 mmol/L (ref 135–145)
Total Bilirubin: 0.6 mg/dL (ref 0.3–1.2)
Total Protein: 6.2 g/dL — ABNORMAL LOW (ref 6.5–8.1)

## 2021-08-21 LAB — TROPONIN I (HIGH SENSITIVITY)
Troponin I (High Sensitivity): 538 ng/L (ref <18)
Troponin I (High Sensitivity): 619 ng/L (ref <18)
Troponin I (High Sensitivity): 645 ng/L (ref <18)

## 2021-08-21 LAB — PROTIME-INR
INR: 1 (ref 0.8–1.2)
Prothrombin Time: 13.6 seconds (ref 11.4–15.2)

## 2021-08-21 LAB — RESPIRATORY PANEL BY PCR

## 2021-08-21 LAB — URINE DRUG SCREEN, QUALITATIVE (ARMC ONLY)
Amphetamines, Ur Screen: NOT DETECTED
Barbiturates, Ur Screen: NOT DETECTED
Benzodiazepine, Ur Scrn: NOT DETECTED
Cannabinoid 50 Ng, Ur ~~LOC~~: NOT DETECTED
Cocaine Metabolite,Ur ~~LOC~~: NOT DETECTED
MDMA (Ecstasy)Ur Screen: NOT DETECTED
Methadone Scn, Ur: NOT DETECTED
Opiate, Ur Screen: NOT DETECTED
Phencyclidine (PCP) Ur S: NOT DETECTED
Tricyclic, Ur Screen: NOT DETECTED

## 2021-08-21 LAB — BRAIN NATRIURETIC PEPTIDE
B Natriuretic Peptide: >4500 pg/mL — ABNORMAL HIGH (ref 0.0–100.0)
B Natriuretic Peptide: >4500 pg/mL — ABNORMAL HIGH (ref 0.0–100.0)

## 2021-08-21 LAB — BLOOD GAS, ARTERIAL
Acid-base deficit: 6.8 mmol/L — ABNORMAL HIGH (ref 0.0–2.0)
Bicarbonate: 16.6 mmol/L — ABNORMAL LOW (ref 20.0–28.0)
FIO2: 0.5
O2 Saturation: 98.5 %
Patient temperature: 37
pCO2 arterial: 25 mmHg — ABNORMAL LOW (ref 32.0–48.0)
pH, Arterial: 7.43 (ref 7.350–7.450)
pO2, Arterial: 111 mmHg — ABNORMAL HIGH (ref 83.0–108.0)

## 2021-08-21 LAB — ETHANOL: Alcohol, Ethyl (B): <10 mg/dL (ref <10)

## 2021-08-21 LAB — CK: Total CK: 75 U/L (ref 38–234)

## 2021-08-21 LAB — LACTIC ACID, PLASMA
Lactic Acid, Venous: 2.5 mmol/L (ref 0.5–1.9)
Lactic Acid, Venous: 1.7 mmol/L (ref 0.5–1.9)

## 2021-08-21 LAB — RESP PANEL BY RT-PCR (FLU A&B, COVID) ARPGX2
Influenza A by PCR: NEGATIVE
Influenza B by PCR: NEGATIVE
SARS Coronavirus 2 by RT PCR: NEGATIVE

## 2021-08-21 LAB — GLUCOSE, CAPILLARY
Glucose-Capillary: 44 mg/dL — CL (ref 70–99)
Glucose-Capillary: 115 mg/dL — ABNORMAL HIGH (ref 70–99)
Glucose-Capillary: 75 mg/dL (ref 70–99)
Glucose-Capillary: 102 mg/dL — ABNORMAL HIGH (ref 70–99)

## 2021-08-21 LAB — CBG MONITORING, ED: Glucose-Capillary: 74 mg/dL (ref 70–99)

## 2021-08-21 LAB — PHOSPHORUS: Phosphorus: 4.4 mg/dL (ref 2.5–4.6)

## 2021-08-21 LAB — PREGNANCY, URINE: Preg Test, Ur: NEGATIVE

## 2021-08-21 LAB — APTT: aPTT: 26 seconds (ref 24–36)

## 2021-08-21 LAB — MAGNESIUM: Magnesium: 1.7 mg/dL (ref 1.7–2.4)

## 2021-08-21 LAB — PREPARE RBC (CROSSMATCH)

## 2021-08-21 LAB — HEMOGLOBIN AND HEMATOCRIT, BLOOD
HCT: 19.9 % — ABNORMAL LOW (ref 36.0–46.0)
Hemoglobin: 6.4 g/dL — ABNORMAL LOW (ref 12.0–15.0)

## 2021-08-21 LAB — MRSA NEXT GEN BY PCR, NASAL: MRSA by PCR Next Gen: NOT DETECTED

## 2021-08-21 MED ORDER — METOPROLOL TARTRATE 5 MG/5ML IV SOLN
5.0000 mg | Freq: Once | INTRAVENOUS | Status: AC
  Administered 2021-08-21: 5 mg via INTRAVENOUS
  Filled 2021-08-21: qty 5

## 2021-08-21 MED ORDER — LORAZEPAM 2 MG/ML IJ SOLN
0.5000 mg | Freq: Once | INTRAMUSCULAR | Status: AC
  Administered 2021-08-21: 0.5 mg via INTRAVENOUS
  Filled 2021-08-21: qty 1

## 2021-08-21 MED ORDER — RIBOFLAVIN 100 MG PO TABS: 200.0000 mg | ORAL_TABLET | Freq: Two times a day (BID) | ORAL | Status: DC

## 2021-08-21 MED ORDER — ACETAMINOPHEN 650 MG RE SUPP
650.0000 mg | RECTAL | Status: AC
  Administered 2021-08-21: 650 mg via RECTAL
  Filled 2021-08-21: qty 1

## 2021-08-21 MED ORDER — CARVEDILOL 25 MG PO TABS
25.0000 mg | ORAL_TABLET | Freq: Every day | ORAL | Status: DC
  Administered 2021-08-21 – 2021-08-22 (×2): 25 mg via ORAL
  Filled 2021-08-21 (×4): qty 1

## 2021-08-21 MED ORDER — FAMOTIDINE IN NACL 20-0.9 MG/50ML-% IV SOLN
20.0000 mg | Freq: Once | INTRAVENOUS | Status: AC
  Administered 2021-08-21: 20 mg via INTRAVENOUS
  Filled 2021-08-21: qty 50

## 2021-08-21 MED ORDER — PRAVASTATIN SODIUM 20 MG PO TABS
20.0000 mg | ORAL_TABLET | Freq: Every day | ORAL | Status: DC
  Administered 2021-08-21 – 2021-08-26 (×5): 20 mg via ORAL
  Filled 2021-08-21 (×5): qty 1

## 2021-08-21 MED ORDER — LEVALBUTEROL HCL 1.25 MG/0.5ML IN NEBU: 1.2500 mg | INHALATION_SOLUTION | Freq: Four times a day (QID) | RESPIRATORY_TRACT | Status: DC | PRN

## 2021-08-21 MED ORDER — HEPARIN SODIUM (PORCINE) 5000 UNIT/ML IJ SOLN
5000.0000 [IU] | Freq: Three times a day (TID) | INTRAMUSCULAR | Status: DC
  Administered 2021-08-21 – 2021-08-26 (×14): 5000 [IU] via SUBCUTANEOUS
  Filled 2021-08-21 (×14): qty 1

## 2021-08-21 MED ORDER — SODIUM CHLORIDE 0.9 % IV SOLN
2.0000 g | Freq: Once | INTRAVENOUS | Status: AC
  Administered 2021-08-21: 2 g via INTRAVENOUS
  Filled 2021-08-21: qty 2

## 2021-08-21 MED ORDER — SODIUM BICARBONATE 8.4 % IV SOLN
INTRAVENOUS | Status: AC
  Filled 2021-08-21: qty 50

## 2021-08-21 MED ORDER — SODIUM BICARBONATE 8.4 % IV SOLN
50.0000 meq | Freq: Once | INTRAVENOUS | Status: AC
  Administered 2021-08-21: 50 meq via INTRAVENOUS
  Filled 2021-08-21: qty 50

## 2021-08-21 MED ORDER — ACETAMINOPHEN 325 MG RE SUPP
650.0000 mg | Freq: Once | RECTAL | Status: AC
  Administered 2021-08-21: 650 mg via RECTAL
  Filled 2021-08-21: qty 2

## 2021-08-21 MED ORDER — ACETAMINOPHEN 325 MG PO TABS
650.0000 mg | ORAL_TABLET | ORAL | Status: AC
  Administered 2021-08-21: 650 mg via ORAL
  Filled 2021-08-21 (×2): qty 2

## 2021-08-21 MED ORDER — CALCIUM GLUCONATE-NACL 1-0.675 GM/50ML-% IV SOLN
1.0000 g | Freq: Once | INTRAVENOUS | Status: AC
  Administered 2021-08-21: 1000 mg via INTRAVENOUS
  Filled 2021-08-21: qty 50

## 2021-08-21 MED ORDER — PANTOPRAZOLE SODIUM 40 MG IV SOLR
40.0000 mg | INTRAVENOUS | Status: DC
  Administered 2021-08-21 – 2021-08-24 (×4): 40 mg via INTRAVENOUS
  Filled 2021-08-21 (×4): qty 40

## 2021-08-21 MED ORDER — METRONIDAZOLE 500 MG/100ML IV SOLN
500.0000 mg | Freq: Once | INTRAVENOUS | Status: AC
  Administered 2021-08-21: 500 mg via INTRAVENOUS
  Filled 2021-08-21: qty 100

## 2021-08-21 MED ORDER — DOCUSATE SODIUM 100 MG PO CAPS
100.0000 mg | ORAL_CAPSULE | Freq: Two times a day (BID) | ORAL | Status: DC | PRN
  Administered 2021-08-22: 10:00:00 100 mg via ORAL
  Filled 2021-08-21: qty 1

## 2021-08-21 MED ORDER — LORAZEPAM 2 MG/ML IJ SOLN: 2.0000 mg | INTRAMUSCULAR | Status: DC | PRN

## 2021-08-21 MED ORDER — VANCOMYCIN HCL IN DEXTROSE 1-5 GM/200ML-% IV SOLN: 1000.0000 mg | Freq: Once | INTRAVENOUS | Status: DC

## 2021-08-21 MED ORDER — HYDROXYCHLOROQUINE SULFATE 200 MG PO TABS
400.0000 mg | ORAL_TABLET | Freq: Every day | ORAL | Status: DC
  Administered 2021-08-21 – 2021-08-26 (×5): 400 mg via ORAL
  Filled 2021-08-21 (×8): qty 2

## 2021-08-21 MED ORDER — METHYLPREDNISOLONE SODIUM SUCC 40 MG IJ SOLR
40.0000 mg | Freq: Every day | INTRAMUSCULAR | Status: DC
  Administered 2021-08-21 – 2021-08-22 (×2): 40 mg via INTRAVENOUS
  Filled 2021-08-21 (×2): qty 1

## 2021-08-21 MED ORDER — PREDNISONE 20 MG PO TABS
20.0000 mg | ORAL_TABLET | Freq: Every day | ORAL | Status: DC
  Administered 2021-08-21: 20 mg via ORAL
  Filled 2021-08-21: qty 2

## 2021-08-21 MED ORDER — ACETAMINOPHEN 325 MG PO TABS: 650.0000 mg | ORAL_TABLET | ORAL | Status: DC | PRN

## 2021-08-21 MED ORDER — SODIUM CHLORIDE 0.9 % IV SOLN
200.0000 mg | Freq: Once | INTRAVENOUS | Status: AC
  Administered 2021-08-21: 200 mg via INTRAVENOUS
  Filled 2021-08-21: qty 20

## 2021-08-21 MED ORDER — CHLORHEXIDINE GLUCONATE CLOTH 2 % EX PADS
6.0000 | MEDICATED_PAD | Freq: Every day | CUTANEOUS | Status: DC
  Administered 2021-08-21: 6 via TOPICAL
  Filled 2021-08-21: qty 6

## 2021-08-21 MED ORDER — OXYCODONE HCL 5 MG PO TABS
5.0000 mg | ORAL_TABLET | ORAL | Status: DC | PRN
Start: 2021-08-21 — End: 2021-08-28
  Administered 2021-08-21 – 2021-08-27 (×5): 10 mg via ORAL
  Filled 2021-08-21 (×2): qty 2
  Filled 2021-08-21: qty 1
  Filled 2021-08-21 (×2): qty 2

## 2021-08-21 MED ORDER — ACETAMINOPHEN 650 MG RE SUPP: 650.0000 mg | RECTAL | Status: DC | PRN

## 2021-08-21 MED ORDER — DIPHENHYDRAMINE HCL 50 MG/ML IJ SOLN
50.0000 mg | Freq: Once | INTRAMUSCULAR | Status: AC
  Administered 2021-08-21: 50 mg via INTRAVENOUS
  Filled 2021-08-21: qty 1

## 2021-08-21 MED ORDER — SODIUM CHLORIDE 0.9 % IV SOLN: 10.0000 mg/kg | INTRAVENOUS | Status: DC

## 2021-08-21 MED ORDER — LACOSAMIDE 50 MG PO TABS
150.0000 mg | ORAL_TABLET | Freq: Two times a day (BID) | ORAL | Status: DC
  Administered 2021-08-21 – 2021-08-22 (×3): 150 mg via ORAL
  Filled 2021-08-21 (×4): qty 3

## 2021-08-21 MED ORDER — SODIUM CHLORIDE 0.9% IV SOLUTION: Freq: Once | INTRAVENOUS | Status: AC

## 2021-08-21 MED ORDER — VANCOMYCIN VARIABLE DOSE PER UNSTABLE RENAL FUNCTION (PHARMACIST DOSING): Status: DC

## 2021-08-21 MED ORDER — MAGNESIUM SULFATE 2 GM/50ML IV SOLN
2.0000 g | Freq: Once | INTRAVENOUS | Status: AC
  Administered 2021-08-21: 2 g via INTRAVENOUS
  Filled 2021-08-21: qty 50

## 2021-08-21 MED ORDER — ACETAMINOPHEN 325 MG PO TABS
650.0000 mg | ORAL_TABLET | Freq: Four times a day (QID) | ORAL | Status: DC | PRN
  Administered 2021-08-22 (×2): 650 mg via ORAL
  Filled 2021-08-21 (×2): qty 2

## 2021-08-21 MED ORDER — DEXTROSE 50 % IV SOLN
25.0000 g | Freq: Once | INTRAVENOUS | Status: AC
  Administered 2021-08-21: 25 g via INTRAVENOUS

## 2021-08-21 MED ORDER — POLYETHYLENE GLYCOL 3350 17 G PO PACK
17.0000 g | PACK | Freq: Every day | ORAL | Status: DC | PRN
  Administered 2021-08-22: 14:00:00 17 g via ORAL
  Filled 2021-08-21: qty 1

## 2021-08-21 MED ORDER — SODIUM CHLORIDE 0.9 % IV SOLN
2.0000 g | INTRAVENOUS | Status: DC
  Administered 2021-08-21 – 2021-08-25 (×4): 2 g via INTRAVENOUS
  Filled 2021-08-21: qty 2
  Filled 2021-08-21: qty 20
  Filled 2021-08-21: qty 2
  Filled 2021-08-21: qty 20
  Filled 2021-08-21: qty 2
  Filled 2021-08-21: qty 20
  Filled 2021-08-21: qty 2
  Filled 2021-08-21: qty 20

## 2021-08-21 MED ORDER — SODIUM BICARBONATE 8.4 % IV SOLN
50.0000 meq | Freq: Once | INTRAVENOUS | Status: AC
  Administered 2021-08-21: 50 meq via INTRAVENOUS

## 2021-08-21 MED ORDER — SODIUM CHLORIDE 0.9 % IV SOLN
500.0000 mg | INTRAVENOUS | Status: AC
  Administered 2021-08-21 – 2021-08-23 (×3): 500 mg via INTRAVENOUS
  Filled 2021-08-21 (×3): qty 500

## 2021-08-21 MED ORDER — DEXTROSE 50 % IV SOLN
INTRAVENOUS | Status: AC
  Filled 2021-08-21: qty 50

## 2021-08-21 MED ORDER — DEXTROSE 10 % IV SOLN: INTRAVENOUS | Status: DC

## 2021-08-21 MED ORDER — SODIUM CHLORIDE 0.9 % IV BOLUS (SEPSIS)
1000.0000 mL | Freq: Once | INTRAVENOUS | Status: AC
  Administered 2021-08-21: 1000 mL via INTRAVENOUS

## 2021-08-21 MED ORDER — VANCOMYCIN HCL 10 G IV SOLR
1750.0000 mg | Freq: Once | INTRAVENOUS | Status: AC
  Administered 2021-08-21: 1750 mg via INTRAVENOUS
  Filled 2021-08-21: qty 20

## 2021-08-21 MED ORDER — DEXTROSE 50 % IV SOLN
1.0000 | Freq: Once | INTRAVENOUS | Status: AC
  Administered 2021-08-21: 50 mL via INTRAVENOUS

## 2021-08-21 MED ORDER — HYDRALAZINE HCL 20 MG/ML IJ SOLN
10.0000 mg | INTRAMUSCULAR | Status: DC | PRN
  Administered 2021-08-22: 05:00:00 10 mg via INTRAVENOUS
  Filled 2021-08-21: qty 1

## 2021-08-21 NOTE — Progress Notes (Signed)
PHARMACY CONSULT NOTE - FOLLOW UP  Pharmacy Consult for Electrolyte Monitoring and Replacement   Recent Labs: Potassium (mmol/L)  Date Value  08/21/2021 5.1   Magnesium (mg/dL)  Date Value  08/21/2021 1.7   Calcium (mg/dL)  Date Value  08/21/2021 7.3 (L)   Albumin (g/dL)  Date Value  08/21/2021 3.2 (L)   Phosphorus (mg/dL)  Date Value  08/21/2021 4.4   Sodium (mmol/L)  Date Value  08/21/2021 139     Assessment: 20 year old female presenting to [facility] ED from home via EMS on 08/21/2021 after reports of an 8 minute seizure at home.    Goal of Therapy:  Elctrolytes WNL   Plan:  Will give Mg 2 g IV x1.  F/u with AM labs.   Oswald Hillock ,PharmD Clinical Pharmacist 08/21/2021 7:38 AM

## 2021-08-21 NOTE — Progress Notes (Signed)
Pharmacy Antibiotic Note  Lateasha Breuer is a 20 y.o. female admitted on 08/21/2021 with sepsis.  Pharmacy has been consulted for Vancomycin dosing.  Pt presents with acute on chronic AKI.  Per MD note,  baseline SrCr is ~ 4, currently SrCr = 7.89.    Plan: Vancomycin 1750 mg IV X 1 given in ED on 11/26 @ 0244. Will monitor renal function and dose by levels until renal function stabilizes.   Height: 5\' 6"  (167.6 cm) Weight: 72.6 kg (160 lb) IBW/kg (Calculated) : 59.3  Temp (24hrs), Avg:102.8 F (39.3 C), Min:101.7 F (38.7 C), Max:103.9 F (39.9 C)  Recent Labs  Lab 08/21/21 0100 08/21/21 0102  WBC 20.1*  --   CREATININE 7.89*  --   LATICACIDVEN  --  2.5*    Estimated Creatinine Clearance: 11.6 mL/min (A) (by C-G formula based on SCr of 7.89 mg/dL (H)).    Allergies  Allergen Reactions   Amlodipine Swelling    Antimicrobials this admission:   >>    >>   Dose adjustments this admission:   Microbiology results:  BCx:   UCx:    Sputum:    MRSA PCR:   Thank you for allowing pharmacy to be a part of this patient's care.  Lexey Fletes D 08/21/2021 4:20 AM

## 2021-08-21 NOTE — Progress Notes (Signed)
CRITICAL CARE   -Called [facility] transfer line for [facility]. [facility] is at full capacity and is unable to accept patients.  The critical care unit does not take waiting list therefore patient is denied for transfer.   -patient has Hb <7, we discussed with family.  Mother reports blood transfusion reaction with airway compromise and tongue swelling on previous PRBC transfusion. She is clinically improved and we will hold blood products until respiratory failure improves.     Ottie Glazier, M.D.  Pulmonary & [place]

## 2021-08-21 NOTE — Sepsis Progress Note (Signed)
Following for sepsis monitoring ?

## 2021-08-21 NOTE — Progress Notes (Addendum)
Hypoglycemia Patient received an amp of D 50 for CBG of 57. Recheck was 67. - D 10 started at 30 mL/h - additional amp of D 50 given - recheck CBG Q 1 hour x 2, can move to Q 2 hour x 2 >  if stable check Q 4 h. - follow ICU hypo/hyper-glycemia protocol  Worsening Acute Hypoxic Respiratory Failure  Tachypneic in the 50's, but WOB seems improved - initiated BIPAP to help support pt as she has been seizure free and is not nauseous - Supplemental O2 to maintain SpO2 > 90% - f/u ABG 8 am - HIGH Intubation Risk - remains febrile > tylenol ordred  Acute on Chronic Anemia Hgb dropped this AM from 7.4 > 6.3. Patient & mother report previous anaphylaxis reactions to transfusions and are not interested in a blood transfusion right now. They would like to discuss with the day team first. - f/u Hgb 10:00 with type and screen/antibody - consider hematology consult    Domingo Pulse Rust-Chester, AGACNP-BC Acute Care Nurse Practitioner [place]   [phone] / [phone] Please see Amion for pager details.

## 2021-08-21 NOTE — Progress Notes (Signed)
PHARMACY CONSULT NOTE - FOLLOW UP  Pharmacy Consult for Electrolyte Monitoring and Replacement   Recent Labs: Potassium (mmol/L)  Date Value  08/21/2021 5.1   Magnesium (mg/dL)  Date Value  12/10/2020 2.1   Calcium (mg/dL)  Date Value  08/21/2021 7.7 (L)   Albumin (g/dL)  Date Value  08/21/2021 3.2 (L)   Phosphorus (mg/dL)  Date Value  12/10/2020 6.0 (H)   Sodium (mmol/L)  Date Value  08/21/2021 137     Assessment: 11/26 @ 0102:   Ca = 7.7, Alb =3.2 ,  Corrected Ca = 8.34  Goal of Therapy:  Elctrolytes WNL   Plan:  Will order Calcium gluconate 1 gm IV X 1 and recheck electrolytes on 11/26 with AM labs.   Orene Desanctis ,PharmD Clinical Pharmacist 08/21/2021 2:17 AM

## 2021-08-21 NOTE — Consult Note (Signed)
Neurology Consultation Reason for Consult: Breakthrough seizure, missed home anti-seizure medication Requesting Physician: Zetta Bills  CC: Breakthrough seizure  History is obtained from: Mother and boyfriend at bedside, limited info from patient, chart review   HPI: Melinda Steele is a 20 y.o. 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  She started to have a cough around Wednesday the night prior to Thanksgiving and had some general fatigue Thursday and Friday.  In this setting she missed her Friday evening medications including Vimpat.  Subsequently she had an 8-minute generalized tonic-clonic seizure for which EMS was activated.  She arrived to the ED in postictal state was found to be febrile to 103.8.  Initially received cefepime, vancomycin and metronidazole, now narrowed to ceftriaxone and azithromycin.  She was additionally loaded with 200 mg IV Vimpat.  She has not had any further seizures.  Work-up was concerning for multifocal pneumonia, and she required BiPAP which she has recently been taken off of but still on high flow oxygen.  She remains febrile and also had a transiently low glucose of 44 overnight.  Regarding her seizure history, per review of [place] neurology notes, she had her first event in January 2022 with left gaze deviation and decerebrate posturing.  She was admitted and there was concern for press versus CNS lupus based on MRI findings.  Her cyclophosphamide was initially stopped and then possibly restarted.  Mom does not recognize this medication and reports perhaps her cyclophosphamide was initially changed to mycophenolate subsequently changed to belimumab which is her primary immunosuppression although she also remains on  Plaquenil and prednisone.  Mom reports she was initially on Keppra 2 g twice daily and that this was changed to Vimpat 150 twice daily secondary to breakthrough seizure; on chart review there are notes that she was changed secondary to her renal function.  Additionally there were some plans to do lumbar puncture to more definitively evaluate for CNS lupus but no records of this being done are available.  Regarding her cardiomyopathy, her EF was as low as 20% but on last cardiology follow-up had improved to 55 to 60% (echo 06/01/2021), and is presumed to be related to her lupus.  ROS: Limited due to patient participation, obtained primarily from family as bedside as able  Past Medical History:  Diagnosis Date   Hypertension    Lupus ([place])    Lupus nephritis ([place])    Seizure ([place])    Family History  Problem Relation Age of Onset   Hyperlipidemia Maternal Grandmother        a. many medical problems, unknown per prior note   Social History:  reports that she has never smoked. She has never used smokeless tobacco. She reports that she does not drink alcohol and does not use drugs.  Current Outpatient Medications  Medication Instructions   Ampicillin-Sulbactam 3 g in sodium chloride 0.9 % 100 mL 3 g, Intravenous, Every 6 hours   carvedilol (COREG) 25 mg, Oral, Daily   chlorthalidone (HYGROTON) 50 mg, Oral, Daily   cloNIDine (CATAPRES - DOSED IN MG/24 HR) 0.3 mg, Transdermal, Weekly   hydroxychloroquine (PLAQUENIL) 400 mg, Oral, Daily   lacosamide (VIMPAT) 150 mg, Oral, 2 times daily   NIFEdipine (ADALAT CC) 90 mg, Oral, Daily   pravastatin (PRAVACHOL) 20 mg, Oral, Daily   predniSONE (DELTASONE) 20 mg, Oral, Daily  with breakfast   spironolactone (ALDACTONE) 12.5 mg, Oral, Daily   torsemide (DEMADEX) 40 mg, Oral, Daily  - benlysta injection weekly    Current Facility-Administered Medications:    acetaminophen (TYLENOL) tablet 650 mg, 650 mg, Oral, Q4H **OR** acetaminophen (TYLENOL)  suppository 650 mg, 650 mg, Rectal, Q4H, Rust-Chester, Britton L, NP, 650 mg at 08/21/21 0607   [START ON 08/22/2021] acetaminophen (TYLENOL) tablet 650 mg, 650 mg, Oral, Q6H PRN, Rust-Chester, Toribio Harbour L, NP   azithromycin (ZITHROMAX) 500 mg in sodium chloride 0.9 % 250 mL IVPB, 500 mg, Intravenous, Q24H, Oswald Hillock, RPH, Last Rate: 250 mL/hr at 08/21/21 0920, 500 mg at 08/21/21 0920   carvedilol (COREG) tablet 25 mg, 25 mg, Oral, Daily, Rust-Chester, Toribio Harbour L, NP, 25 mg at 08/21/21 1033   cefTRIAXone (ROCEPHIN) 2 g in sodium chloride 0.9 % 100 mL IVPB, 2 g, Intravenous, Q24H, Rust-Chester, Britton L, NP, Last Rate: 200 mL/hr at 08/21/21 1032, 2 g at 08/21/21 1032   Chlorhexidine Gluconate Cloth 2 % PADS 6 each, 6 each, Topical, Q0600, Ottie Glazier, MD, 6 each at 08/21/21 0414   dextrose 10 % infusion, , Intravenous, Continuous, Rust-Chester, Huel Cote, NP, Last Rate: 30 mL/hr at 08/21/21 7169, Infusion Verify at 08/21/21 0528   docusate sodium (COLACE) capsule 100 mg, 100 mg, Oral, BID PRN, Rust-Chester, Toribio Harbour L, NP   heparin injection 5,000 Units, 5,000 Units, Subcutaneous, Q8H, Rust-Chester, Britton L, NP   hydrALAZINE (APRESOLINE) injection 10-20 mg, 10-20 mg, Intravenous, Q4H PRN, Rust-Chester, Toribio Harbour L, NP   hydroxychloroquine (PLAQUENIL) tablet 400 mg, 400 mg, Oral, Daily, Rust-Chester, Toribio Harbour L, NP   lacosamide (VIMPAT) tablet 150 mg, 150 mg, Oral, BID, Rust-Chester, Britton L, NP, 150 mg at 08/21/21 1030   levalbuterol (XOPENEX) nebulizer solution 1.25 mg, 1.25 mg, Nebulization, Q6H PRN, Rust-Chester, Toribio Harbour L, NP   LORazepam (ATIVAN) injection 2 mg, 2 mg, Intravenous, Q1H PRN, Rust-Chester, Britton L, NP   pantoprazole (PROTONIX) injection 40 mg, 40 mg, Intravenous, Q24H, Rust-Chester, Britton L, NP, 40 mg at 08/21/21 1031   polyethylene glycol (MIRALAX / GLYCOLAX) packet 17 g, 17 g, Oral, Daily PRN, Rust-Chester, Toribio Harbour L, NP   pravastatin (PRAVACHOL) tablet 20 mg, 20 mg,  Oral, Daily, Rust-Chester, Britton L, NP, 20 mg at 08/21/21 1031   predniSONE (DELTASONE) tablet 20 mg, 20 mg, Oral, Q breakfast, Rust-Chester, Toribio Harbour L, NP, 20 mg at 08/21/21 1031    Exam: Current vital signs: BP 109/75   Pulse (!) 128   Temp (!) 101.9 F (38.8 C) (Axillary)   Resp (!) 43   Ht 5\' 6"  (1.676 m)   Wt 72.6 kg   SpO2 100%   BMI 25.82 kg/m  Vital signs in last 24 hours: Temp:  [101.7 F (38.7 C)-103.9 F (39.9 C)] 101.9 F (38.8 C) (11/26 0600) Pulse Rate:  [128-140] 128 (11/26 0757) Resp:  [22-49] 43 (11/26 0757) BP: (109-165)/(75-115) 109/75 (11/26 0700) SpO2:  [85 %-100 %] 100 % (11/26 0757) Weight:  [72.6 kg] 72.6 kg (11/26 0058)   Physical Exam  Constitutional: Appears acutely ill, but constitutionally well nourished and well developed, extremely fatigued Psych: Flat and reluctant to participate in examination Eyes: No scleral injection HENT: No oropharyngeal obstruction.  High flow nasal cannula in place MSK: no obvious joint deformities.  Cardiovascular: Tachycardic with regular rhythm.  Respiratory: Tachypneic and does seem to have some increased work of breathing but tolerating high flow oxygen GI: Soft.  No distension. There is no tenderness.  Skin: Warm dry  and intact visible skin  Neuro: Mental Status: Patient sleeping when I initially entered the room but awakens as I am talking to her family.  She is able to follow simple commands and do some simple naming.  When asked orientation questions she initially states she does not know.  However she is able to state that it was recently Thanksgiving and thereafter tell me that it is November. Cranial Nerves: II: Visual Fields are full. Pupils are equal, round, and reactive to light.   III,IV, VI: EOMI without ptosis or diploplia.  V: Facial sensation is symmetric to temperature VII: Facial movement is symmetric.  VIII: hearing is intact to voice X: Uvula elevates symmetrically XI: Shoulder shrug  is symmetric. XII: tongue is midline without atrophy or fasciculations.  Motor: Tone is normal. Bulk is normal.  She is extremely fatigued and has some slight symmetric pronator drift of the bilateral upper extremities.  She briefly lifts bilateral lower extremities antigravity, giving 3/5 effort at best throughout symmetrically Sensory: Sensation is symmetric to light touch in the arms and legs. Deep Tendon Reflexes: 3+ and symmetric in the biceps and patellae.  Cerebellar: Normal within limits of participation   I have reviewed labs in epic and the results pertinent to this consultation are: Initial hemoglobin 7.4 downtrending to 6.3, leukocytosis to 20, thrombocytopenia to 128 Creatinine elevated at 7.89 with a BUN of 86 (baseline creatinine 1.76) Mild hypocalcemia to 7.7 correcting to 8.34 given albumin of 3.2 initially, now s/p 1 g IV calcium with improvement BNP 4500 (stable from prior) Troponin 538 -> 619 -> 645  Flu/COVID-19 negative Glucose low with nadir of 44 overnight now improved with intervention  I have reviewed the images obtained: Head CT personally reviewed, negative for acute intracranial process, CT C-spine personally reviewed, negative for acute process Chest x-ray concerning for multifocal pneumonia, personally reviewed  Assessment: Breakthrough seizures in patient with known seizure disorder in the setting of acute infection, as well as missed medication.  At this time does not seem to have ongoing seizure activity and is improving clinically with excellent supportive care from primary team.  While MRI brain would be helpful for again evaluating for the possibility of some element of proximal (especially given her transient hypertension initially on arrival), given her neurologic stability/improvement and the fact that she has clear etiologies for breakthrough seizure as well as her tenuous respiratory status, do not feel the risks of MRI at this time outweigh the  benefit.  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 8 PM to 8 AM emergent questions or overnight urgent questions should be addressed to Teleneurology On-call or [facility] neurohospitalist; contact information  can be found on AMION  Total critical care time: 65 minutes   Critical care time was exclusive of separately billable procedures and treating other patients.   Critical care was necessary to treat or prevent imminent or life-threatening deterioration.   Critical care was time spent personally by me on the following activities: development of treatment plan with patient and/or surrogate as well as nursing, discussions with consultants/primary team, evaluation of patient's response to treatment, examination of patient, obtaining history from patient or surrogate, ordering and performing treatments and interventions, ordering and review of laboratory studies, ordering and review of radiographic studies, and re-evaluation of patient's condition as needed, as documented above.

## 2021-08-21 NOTE — H&P (Addendum)
NAME:  Melinda Steele, MRN:  [id number], DOB:  [birth year], LOS: 0 ADMISSION DATE:  08/21/2021, CONSULTATION DATE:  08/21/2021 REFERRING MD:  Dr. Leonides Schanz, CHIEF COMPLAINT:  Seizures   History of Present Illness:  20 year old female presenting to [facility] ED from home via EMS on 08/21/2021 after reports of an 8 minute seizure at home.  Per ED documentation EMS reported the patient was tachycardic hypertensive and postictal upon arrival.  CBG check was in the 130's.  History provided by mother and friend at bedside, the patient's friend stated he was on the phone with her when she became unresponsive and sounded as though she was " gasping and choking".  The patient's mother woke up around the same time because she her her daughter gasping and fall out of bed.  Upon arrival to the patient's room she was found on the floor with what the mother is reporting as tonic-clonic seizures: Eyes rolled back into her head with all extremities stiffened.  Per the patient's report the last thing she remembers is vomiting.  Patient also admits to feeling generalized fatigue for the last 2 to 3 days but denies any localized symptoms.  She denies nausea/vomiting/diarrhea/abdominal pain, dysuria/polyuria, shortness of breath/productive cough, fever/chills, poor p.o. intake, weight gain/increased swelling, orthopnea and patient denies any pain at this time. ED course: Per EDP patient appeared post ictal on arrival, now oriented & moving all extremities. Due to breakthrough seizure while on Vimpat, EDP discussed the case with Pharmacist and a loading dose was given IV. Patient was switched to Vimpat from [place] due to breakthrough seizures. Patient met criteria for Sepsis workup and protocol initiated with only 1 L of IVF given due to BNP > 4500. Patient was transitioned to [facility] because of hypoxia. Medications given: Cefepime/Flagyl/Vancomycin, 1 L NS bolus, Vimpat 200 mg IV, acetaminophen 650 mg  Initial Vitals: febrile-103.9,  tachypneic-42, tachycardic-140, hypertensive-165/115 & hypoxic with SpO2 85% on 4 L [place] Significant labs: (Labs/ Imaging personally reviewed) I, Domingo Pulse [place], AGACNP-BC, personally viewed and interpreted this ECG. EKG Interpretation: Date: 08/21/21, EKG Time: 00:49, Rate: 138, Rhythm: ST,  QRS Axis:  normal, Intervals: RSR in V1-V2 (unchanged from previous EKG), ST/T Wave abnormalities: non specific T wave inversions, Narrative Interpretation: ST Chemistry: Na+:137, K+: 5.1, BUN/Cr.: 86/ 7.89, Serum CO2/ AG: 17 /10 Hematology: WBC: 20.1, Hgb: 7.4,  Troponin: 538, BNP: >4,500, Lactic/ PCT: 2.5/ 32.25,  COVID-19 & Influenza A/B: negative ABG: 7.43/ 25/ 111/ 16.6 CXR 08/21/2021: Extensive patchy airspace disease throughout RUL & RLL, concerning for multifocal pneumonia. Underlying cardiomegaly with perihilar congestion without overt edema, trace bilateral pleural effusions.  PCCM consulted for admission due to complexity of patient & high risk for intubation.  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  Interim History / Subjective:  Patient appears anxious, withdrawn but able to answer questions appropriately & follow commands. She reports no other breakthrough seizure activity since her August meeting with neurology until overnight. No current complaints, wondering when she can eat again. Discussed plan of care with patient, mother & friend bedside- all questions answered at this time.  Objective   Blood pressure Marland Kitchen)  165/115, pulse (!) 140, temperature (!) 103.9 F (39.9 C), temperature source Rectal, resp. rate (!) 42, height 5' 6"  (1.676 m), weight 72.6 kg, SpO2 (!) 85 %.       No intake or output data in the 24 hours ending 08/21/21 0203 Filed Weights   08/21/21 0058  Weight: 72.6 kg    Examination: General: Adult female, critically ill, lying in bed on HHFNC, NAD HEENT: MM pink/moist, anicteric, atraumatic, neck supple Neuro: A&O x 4, able to follow commands, PERRL +3, MAE CV: s1s2 RRR, ST on monitor, no r/m/g Pulm: Regular, non labored on HHFNC with 40%/45L, breath sounds diminished throughout GI: soft, rounded, non tender, bs x 4 Skin: limited exam- no rashes/lesions noted Extremities: warm/dry, pulses + 2 R/P, no edema noted  Resolved Hospital Problem list     Assessment & Plan:  Sepsis without septic shock due to suspected Aspiration Pneumonia vs CAP Patient vomited during seizure- however reports feeling rundown over the last 2-3 days without any other localized symptoms Lactic: 2.5, Baseline PCT: 32.25, UA: +leuks, +Hgb, Ur ph 8.5, proteinuria, CXR: multifocal RUL & RLL pna with vascular congestion Initial interventions/workup included: 1 L of NS & Cefepime/ Vancomycin/ Metronidazole - Supplemental oxygen as needed, to maintain SpO2 > 90% - f/u cultures, trend lactic/ PCT - Daily CBC, monitor WBC/ fever curve - IV antibiotics: ceftriaxone & vancomycin  - cautious IVF hydration   Acute Hypoxic Respiratory Failure secondary to Pneumonia in the setting of Acute Seizure & Acute on chronic HFrEF - Continue HHFNC overnight, wean FiO2 as tolerated  - Supplemental O2 to maintain SpO2 > 90% - Intermittent chest x-ray & ABG PRN - Ensure adequate pulmonary hygiene  - F/u cultures, trend PCT - Continue CAP/Aspiration Pna coverage: ceftriaxone & vancomycin - bronchodilators PRN  Acute Renal Failure superimposed on CKD Stage 3b in the setting of 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 PMHx: Nephrotic Syndrome Baseline Cr: 2.5-3.1 (July/22 labs), Cr on admission:7.89 - continue hydroxychloroquine 400 mg daily & prednisone 20 mg daily - Benlysta injection on hold while we are treating for an active injection (due to get today 11/26), restart as able - Strict I/O's: alert provider if UOP < 0.5 mL/kg/hr - gentle IVF hydration  - Daily BMP, replace electrolytes PRN - Avoid nephrotoxic agents as able, ensure adequate renal perfusion - Nephrology consulted, appreciate input   Acute Seizure secondary to medication noncompliance in the setting chronic seizure disorder Patient had reported breakthrough seizures while on Keppra and was changed to Vimpat. She received 200 mg loading dose of Vimpat IV once in ED. Pt reports last dose was in the AM on 11/25 so she admitted to missing her evening dose last night. - continue outpatient Vimpat 150 mg BID - EEG ordered - Ativan 2 mg PRN for seizure activity - neuro checks Q 4 h - seizure precautions - Neurology consulted, appreciate input  Acute on Chronic HFrEF exacerbation Elevated Troponin secondary to N-STEMI vs demand ischemia HTN HLD PMHx: HFrEF (previous LVEF 20%), Chronic pericardial effusion 9/22 ECHO: showed improved LVEF >55% & improved small pericardial effusion Troponins: 538 BNP: >4500 - continue home carvedilol, pravastatin - Echocardiogram ordered - Trend troponin > consider systemic anticoagulation if needed - f/u BNP, lipid panel - Continuous cardiac monitoring  - Daily weights to assess volume status - Diurese BP & renal function tolerate - rest of antihypertensive regimen on hold consider restarting as patient stabilizes: nifedipine & chlorthalidone - hydralazine PRN IV ordered if SBP >  160 - Consider cardiology consult  Fall secondary to seizure activity - f/u CTH & C-spine - falls precautions  Chronic Anemia in the setting of Chronic Disease PMHx: Hemolytic Anemia Hgb: 7.4 which  appears to be around baseline - Monitor for s/s of bleeding - Daily CBC - Transfuse for Hgb <7  Best Practice (right click and "Reselect all SmartList Selections" daily)  Diet/type: NPO w/ oral meds DVT prophylaxis: prophylactic heparin  GI prophylaxis: PPI Lines: N/A Foley:  N/A Code Status:  full code Last date of multidisciplinary goals of care discussion [08/21/21]  Labs   CBC: Recent Labs  Lab 08/21/21 0100  WBC 20.1*  NEUTROABS 18.5*  HGB 7.4*  HCT 23.4*  MCV 87.6  PLT 459    Basic Metabolic Panel: Recent Labs  Lab 08/21/21 0100  NA 137  K 5.1  CL 110  CO2 17*  GLUCOSE 80  BUN 86*  CREATININE 7.89*  CALCIUM 7.7*   GFR: Estimated Creatinine Clearance: 11.6 mL/min (A) (by C-G formula based on SCr of 7.89 mg/dL (H)). Recent Labs  Lab 08/21/21 0100 08/21/21 0102  WBC 20.1*  --   LATICACIDVEN  --  2.5*    Liver Function Tests: Recent Labs  Lab 08/21/21 0100  AST 20  ALT 15  ALKPHOS 43  BILITOT 0.6  PROT 6.2*  ALBUMIN 3.2*   No results for input(s): LIPASE, AMYLASE in the last 168 hours. No results for input(s): AMMONIA in the last 168 hours.  ABG    Component Value Date/Time   PHART 7.28 (L) 12/09/2020 0537   PCO2ART 37 12/09/2020 0537   PO2ART 146 (H) 12/09/2020 0537   HCO3 17.4 (L) 12/09/2020 0537   ACIDBASEDEF 8.6 (H) 12/09/2020 0537   O2SAT 99.0 12/09/2020 0537     Coagulation Profile: Recent Labs  Lab 08/21/21 0101  INR 1.0    Cardiac Enzymes: No results for input(s): CKTOTAL, CKMB, CKMBINDEX, TROPONINI in the last 168 hours.  HbA1C: No results found for: HGBA1C  CBG: Recent Labs  Lab 08/21/21 0048  GLUCAP 74    Review of Systems: Positives in BOLD  Gen: Denies fever, chills, weight change, fatigue, night sweats HEENT: Denies blurred vision, double vision, hearing loss, tinnitus, sinus congestion, rhinorrhea, sore throat, neck stiffness, dysphagia PULM: Denies shortness of breath, cough, sputum production,  hemoptysis, wheezing CV: Denies chest pain, edema, orthopnea, paroxysmal nocturnal dyspnea, palpitations GI: Denies abdominal pain, nausea, vomiting, diarrhea, hematochezia, melena, constipation, change in bowel habits GU: Denies dysuria, hematuria, polyuria, oliguria, urethral discharge Endocrine: Denies hot or cold intolerance, polyuria, polyphagia or appetite change Derm: Denies rash, dry skin, scaling or peeling skin change Heme: Denies easy bruising, bleeding, bleeding gums Neuro: Denies headache, numbness, weakness, slurred speech, loss of memory or consciousness  Past Medical History:  She,  has a past medical history of Hypertension, Lupus ([place]), Lupus nephritis ([place]), and Seizure ([place]).   Surgical History:  History reviewed. No pertinent surgical history.   Social History:   reports that she has never smoked. She has never used smokeless tobacco. She reports that she does not drink alcohol and does not use drugs.   Family History:  Her family history includes Hyperlipidemia in her maternal grandmother.   Allergies Allergies  Allergen Reactions   Amlodipine Swelling     Home Medications  Prior to Admission medications   Medication Sig Start Date End Date Taking? Authorizing Provider  Ampicillin-Sulbactam 3 g in sodium chloride 0.9 % 100 mL Inject 3 g into the  vein every 6 (six) hours. 12/10/20   Flora Lipps, MD  cloNIDine (CATAPRES - DOSED IN MG/24 HR) 0.3 mg/24hr patch Place 1 patch (0.3 mg total) onto the skin once a week. 12/10/20   Flora Lipps, MD     Critical care time: 65 minutes       Venetia Night, AGACNP-BC Acute Care Nurse Practitioner [facility]   [phone] / [phone] Please see Amion for pager details.

## 2021-08-21 NOTE — Progress Notes (Signed)
Placed patient on [place] for trial off of bipap.

## 2021-08-21 NOTE — ED Triage Notes (Signed)
Pt arrives to ED from home via [facility] With c/c of witnessed seizure at home, Family states seizure lasted approx 8 minutes. Pt fell from bed and possibly bit tongue. EMS reports transport vitals: p134, 176/102, resp 26, O2 sat 94% on 2L via nasal cannula. Upon arrival, pt post ictal and non verbal but able to follow simple commands.Marland Kitchen

## 2021-08-21 NOTE — Progress Notes (Signed)
PHARMACY -  BRIEF ANTIBIOTIC NOTE   Pharmacy has received consult(s) for Vancomycin, Cefepime  from an ED provider.  The patient's profile has been reviewed for ht/wt/allergies/indication/available labs.    One time order(s) placed for Vancomycin 1750 mg IV X 1 and Cefepime 2 gm IV X 1  Further antibiotics/pharmacy consults should be ordered by admitting physician if indicated.                       Thank you, Nakiesha Rumsey D 08/21/2021  1:48 AM

## 2021-08-21 NOTE — ED Provider Notes (Addendum)
[facility] Emergency Department Provider Note  ____________________________________________   Event Date/Time   First MD Initiated Contact with Patient 08/21/21 0159     (approximate)  I have reviewed the triage vital signs and the nursing notes.   HISTORY  Chief Complaint Seizures    HPI Melinda Steele is a 20 y.o. female with history of hypertension, lupus, lupus nephritis, dilated cardiomyopathy with EF of 20%, pericarditis and chronic pericardial effusion, seizures on Vimpat who presents to the emergency department with EMS after she had a seizure at home.  History is provided by EMS and patient's mother.  EMS reports patient was tachycardic, hypertensive and postictal.  Her blood sugar was in the 130s.  They report mother stated she had a generalized tonic-clonic seizure in bed that lasted about 8 minutes.  Mother states that the patient did roll out of the bed when this happened.  Mother states that she has history of tonic-clonic seizures but never 1 this prolonged period she states that the patient's eyes rolled in the back of her head and she stiffened all of her extremities.  She did bite her tongue.  Mother states the patient lives with her and she thinks she has been compliant with her Vimpat.  She takes 150 mg twice a day.  She is followed at [facility].  Her last seizures were in May 2022 and she was admitted to [facility] at that time with concerns for PRES versus neuro lupus flare.  Mother reports patient has been intubated previously for status epilepticus.  Patient is on immunosuppressants for her lupus.  She does have kidney disease and mother reports a baseline creatinine of around 4.  Mother states patient has been complaining of not feeling well for the past couple of days and has had a cough but no known fevers.  Mother reports she did vomit once after her seizure but no signs of aspiration.  Patient denies any pain at this time.    On review of records  from [facility] it appears her last creatinine was 3.12 on 04/23/2021.      Past Medical History:  Diagnosis Date   Hypertension    Lupus ([place])    Lupus nephritis ([place])    Seizure [facility])     Patient Active Problem List   Diagnosis Date Noted   Sepsis ([place]) 08/21/2021   Acute respiratory failure with hypoxia ([place]) 12/09/2020   Systemic lupus erythematosus ([place]) 09/18/2019    History reviewed. No pertinent surgical history.  Prior to Admission medications   Medication Sig Start Date End Date Taking? Authorizing Provider  Ampicillin-Sulbactam 3 g in sodium chloride 0.9 % 100 mL Inject 3 g into the vein every 6 (six) hours. 12/10/20   Flora Lipps, MD  cloNIDine (CATAPRES - DOSED IN MG/24 HR) 0.3 mg/24hr patch Place 1 patch (0.3 mg total) onto the skin once a week. 12/10/20   Flora Lipps, MD    Allergies Amlodipine  Family History  Problem Relation Age of Onset   Hyperlipidemia Maternal Grandmother        a. many medical problems, unknown per prior note    Social History Social History   Tobacco Use   Smoking status: Never   Smokeless tobacco: Never  Substance Use Topics   Alcohol use: Never   Drug use: Never    Review of Systems Level 5 caveat secondary to postictal state  ____________________________________________   PHYSICAL EXAM:  VITAL SIGNS: ED Triage Vitals  Enc Vitals Group  BP 08/21/21 0057 (!) 165/115     Pulse Rate 08/21/21 0057 (!) 140     Resp 08/21/21 0057 (!) 42     Temp 08/21/21 0057 (!) 103.9 F (39.9 C)     Temp Source 08/21/21 0057 Rectal     SpO2 08/21/21 0055 (!) 86 %     Weight 08/21/21 0058 160 lb (72.6 kg)     Height 08/21/21 0058 5\' 6"  (1.676 m)     Head Circumference --      Peak Flow --      Pain Score --      Pain Loc --      Pain Edu? --      Excl. in GC? --    CONSTITUTIONAL: Alert and oriented x 3 but is still postictal and unable to answer all questions appropriately.  She is awake and protecting her airway. HEAD:  Normocephalic, small abrasion noted to the left face but no other sign of traumatic injury EYES: Conjunctivae clear, pupils appear equal, EOM appear intact ENT: normal nose; moist mucous membranes, no dental injury, abrasions to her tongue NECK: Supple, normal ROM, no midline spinal tenderness or step-off or deformity, no meningismus CARD: Regular and tachycardic; S1 and S2 appreciated; no murmurs, no clicks, no rubs, no gallops RESP: Rhonchorous breath sounds bilaterally.  Patient is tachypneic with increased work of breathing.  No rales or wheezing. ABD/GI: Normal bowel sounds; non-distended; soft, non-tender, no rebound, no guarding, no peritoneal signs, no hepatosplenomegaly BACK: The back appears normal, no midline spinal tenderness or step-off or deformity EXT: Normal ROM in all joints; no deformity noted, no edema; no cyanosis, no calf tenderness or calf swelling SKIN: Normal color for age and race; warm; no rash on exposed skin NEURO: Moves all extremities equally, no facial asymmetry, normal speech, patient is postictal PSYCH: The patient's mood and manner are appropriate.  ____________________________________________   LABS (all labs ordered are listed, but only abnormal results are displayed)  Labs Reviewed  CBC WITH DIFFERENTIAL/PLATELET - Abnormal; Notable for the following components:      Result Value   WBC 20.1 (*)    RBC 2.67 (*)    Hemoglobin 7.4 (*)    HCT 23.4 (*)    Neutro Abs 18.5 (*)    Abs Immature Granulocytes 0.13 (*)    All other components within normal limits  COMPREHENSIVE METABOLIC PANEL - Abnormal; Notable for the following components:   CO2 17 (*)    BUN 86 (*)    Creatinine, Ser 7.89 (*)    Calcium 7.7 (*)    Total Protein 6.2 (*)    Albumin 3.2 (*)    GFR, Estimated 7 (*)    All other components within normal limits  BRAIN NATRIURETIC PEPTIDE - Abnormal; Notable for the following components:   B Natriuretic Peptide >4,500.0 (*)    All other  components within normal limits  LACTIC ACID, PLASMA - Abnormal; Notable for the following components:   Lactic Acid, Venous 2.5 (*)    All other components within normal limits  BLOOD GAS, ARTERIAL - Abnormal; Notable for the following components:   pCO2 arterial 25 (*)    pO2, Arterial 111 (*)    Bicarbonate 16.6 (*)    Acid-base deficit 6.8 (*)    All other components within normal limits  TROPONIN I (HIGH SENSITIVITY) - Abnormal; Notable for the following components:   Troponin I (High Sensitivity) 538 (*)    All other components within normal  limits  RESP PANEL BY RT-PCR (FLU A&B, COVID) ARPGX2  CULTURE, BLOOD (ROUTINE X 2)  CULTURE, BLOOD (ROUTINE X 2)  URINE CULTURE  MRSA NEXT GEN BY PCR, NASAL  ETHANOL  PROTIME-INR  APTT  PROCALCITONIN  URINALYSIS, COMPLETE (UACMP) WITH MICROSCOPIC  URINE DRUG SCREEN, QUALITATIVE ([facility] ONLY)  LACTIC ACID, PLASMA  CBC  MAGNESIUM  PHOSPHORUS  BASIC METABOLIC PANEL  CBG MONITORING, ED  POC URINE PREG, ED  TROPONIN I (HIGH SENSITIVITY)   ____________________________________________  EKG   EKG Interpretation  Date/Time:  Saturday August 21 2021 00:49:55 EST Ventricular Rate:  138 PR Interval:  120 QRS Duration: 93 QT Interval:  274 QTC Calculation: 416 R Axis:   40 Text Interpretation: Sinus tachycardia RSR' in V1 or V2, right VCD or RVH LVH by voltage Confirmed by Pryor Curia [phone]) on 08/21/2021 12:54:11 AM        ____________________________________________  RADIOLOGY Jessie Foot Leticia Coletta, personally viewed and evaluated these images (plain radiographs) as part of my medical decision making, as well as reviewing the written report by the radiologist.  ED MD interpretation: Chest x-ray concerning for multifocal pneumonia.  No pulmonary edema seen.  Official radiology report(s): DG Chest Portable 1 View  Result Date: 08/21/2021 CLINICAL DATA:  Initial evaluation for acute fever, seizure. EXAM: PORTABLE CHEST 1 VIEW  COMPARISON:  Prior radiograph from 12/09/2020 FINDINGS: Marked cardiomegaly, stable. Mediastinal silhouette within normal limits. Lungs normally inflated. Prominent patchy airspace opacity seen throughout the right upper and lower lobes, concerning for multifocal pneumonia. Possible patchy involvement of the left lung base as well. Underlying mild perihilar vascular congestion without overt pulmonary edema. Probable trace bilateral pleural effusions. No pneumothorax. No acute osseous finding. IMPRESSION: 1. Extensive patchy airspace disease throughout the right upper and lower lobes, concerning for multifocal pneumonia. 2. Underlying cardiomegaly with perihilar vascular congestion without overt pulmonary edema. 3. Probable trace bilateral pleural effusions. Electronically Signed   By: Jeannine Boga M.D.   On: 08/21/2021 01:18    ____________________________________________   PROCEDURES  Procedure(s) performed (including Critical Care):  Procedures  CRITICAL CARE Performed by: Cyril Mourning Arrion Burruel   Total critical care time: 65 minutes  Critical care time was exclusive of separately billable procedures and treating other patients.  Critical care was necessary to treat or prevent imminent or life-threatening deterioration.  Critical care was time spent personally by me on the following activities: development of treatment plan with patient and/or surrogate as well as nursing, discussions with consultants, evaluation of patient's response to treatment, examination of patient, obtaining history from patient or surrogate, ordering and performing treatments and interventions, ordering and review of laboratory studies, ordering and review of radiographic studies, pulse oximetry and re-evaluation of patient's condition.  ____________________________________________   INITIAL IMPRESSION / ASSESSMENT AND PLAN / ED COURSE  As part of my medical decision making, I reviewed the following data within  the [place] History obtained from family, Nursing notes reviewed and incorporated, Labs reviewed , EKG interpreted , Old EKG reviewed, Old chart reviewed, Radiograph reviewed , Discussed with admitting physician , and Notes from prior ED visits         Patient here after a seizure.  She is currently postictal but oriented and moving all extremities.  No signs of status epilepticus.  She is hypertensive here but not significantly but does have a history of press.  We will continue to monitor this closely.  She is on Vimpat twice daily.  Discussed with pharmacist who recommends loading  with Vimpat 200 mg IV x1.  It appears per her records at [facility] she has had breakthrough seizures while on Keppra.  Patient also feels very warm to touch to a rectal temperature was obtained especially given she was tachycardic and tachypneic.  Rectal temp is 103.8.  I am concerned for sepsis.  We will start broad-spectrum antibiotics.  Will hydrate patient but do so cautiously given her history of EF of 20%.  Differential includes pneumonia, influenza, COVID-19, other viral URI, CHF exacerbation, PE, ACS, UTI.  Will obtain labs, cultures, urine, chest x-ray.  Patient is hypoxic on room air.  Currently satting 100% on 6 L nasal cannula.  Does not wear oxygen chronically.  We will place her on high flow nasal cannula given her work of breathing and obtain an ABG.  ED PROGRESS  Chest x-ray concerning for multifocal pneumonia.  No overt edema seen.  Her BNP however is greater than 4500.  Troponin is also elevated which is likely due to sepsis, demand ischemia.  Her EKG shows some inferior and lateral ST depression again likely related rate related.  She denies having any chest pain.  Patient also has an AKI with a creatinine of 7.89.  Her potassium was 5.1.  She is currently getting hydration slowly.  Clinically she seems to be improving on high flow nasal cannula.  Her ABG shows a compensated metabolic  acidosis.  Her lactic is slightly elevated at 2.5 which is likely due to sepsis as well as a prolonged seizure at home.  Patient has a leukocytosis of 20,000 with left shift consistent with sepsis and a procalcitonin of 32.25.  Her COVID and flu swabs are negative.  Discussed case with Venetia Night, NP with ICU who agrees to admission.  Discussed with mother given patient is critically ill with multiple signs of endorgan damage that I do not feel she is stable enough to be transferred to [facility] where she receives most of her care.  Mother is comfortable with this plan.  We did discuss if she had any worsening of her respiratory function that both myself and the ICU provider would recommend intubation over BiPAP especially given her postictal state.  Mother verbalized understanding.   I reviewed all nursing notes and pertinent previous records as available.  I have reviewed and interpreted any EKGs, lab and urine results, imaging (as available).  ____________________________________________   FINAL CLINICAL IMPRESSION(S) / ED DIAGNOSES  Final diagnoses:  Multifocal pneumonia  Severe sepsis ([place])  Seizure ([place])  AKI (acute kidney injury) ([place])  Acute respiratory failure with hypoxia [facility])     ED Discharge Orders     None       *Please note:  Hertha Gergen was evaluated in Emergency Department on 08/21/2021 for the symptoms described in the history of present illness. She was evaluated in the context of the global COVID-19 pandemic, which necessitated consideration that the patient might be at risk for infection with the SARS-CoV-2 virus that causes COVID-19. Institutional protocols and algorithms that pertain to the evaluation of patients at risk for COVID-19 are in a state of rapid change based on information released by regulatory bodies including the CDC and federal and state organizations. These policies and algorithms were followed during the patient's care in the ED.  Some  ED evaluations and interventions may be delayed as a result of limited staffing during and the pandemic.*   Note:  This document was prepared using Dragon voice recognition software and may include  unintentional dictation errors.    Khamille Beynon, Delice Bison, DO 08/21/21 0231    Dala Breault, Delice Bison, DO 08/21/21 [phone]

## 2021-08-21 NOTE — Consult Note (Signed)
[facility] CONSULT NOTE    Date: 08/21/2021                  Patient Name:  Melinda Steele  MRN: [id number]  DOB: [birth year]  Age / Sex: 20 y.o., female         PCP: [place], [place]                 Service Requesting Consult: Critical care                 Reason for Consult: Acute kidney injury/chronic kidney disease stage IV/lupus nephritis            History of Present Illness: Patient is a 20 y.o. 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  History mainly obtained from the patient's mother.  It appears that she developed a seizure at home lasting 8 minutes.  Subsequently brought here for further evaluation.  We are asked to see her for evaluation and management of acute kidney injury in the setting of chronic kidney disease stage IV and known class IV lupus nephritis.  She has been treated with Benlysta and reports adherence with the medication.  She is regularly followed by [facility] nephrology.  Back on 04/23/2021 creatinine was 3.12 with an EGFR of 21.  However there is significant worsening in her underlying renal function now with BUN up to 88, creatinine 7.9, in EGFR down to 7.  She also appears to have significant anemia with a hemoglobin of 6.4 at the moment.  Patient was on high flow nasal cannula upon initial evaluation.  Case discussed extensively with pulmonary/critical care.   Medications: Outpatient medications: Medications Prior to Admission  Medication Sig Dispense Refill Last Dose   carvedilol (COREG) 25 MG tablet Take 25 mg by mouth daily.   08/20/2021   chlorthalidone  (HYGROTON) 50 MG tablet Take 50 mg by mouth daily.   08/20/2021   cloNIDine (CATAPRES - DOSED IN MG/24 HR) 0.3 mg/24hr patch Place 1 patch (0.3 mg total) onto the skin once a week. 4 patch 12 Past Month   hydroxychloroquine (PLAQUENIL) 200 MG tablet Take 400 mg by mouth daily.   08/20/2021   lacosamide (VIMPAT) 50 MG TABS tablet Take 150 mg by mouth 2 (two) times daily.   08/20/2021   NIFEdipine (ADALAT CC) 90 MG 24 hr tablet Take 90 mg by mouth daily.   08/20/2021   pravastatin (PRAVACHOL) 20 MG tablet Take 20 mg by mouth daily.   08/20/2021   predniSONE (DELTASONE) 20 MG tablet Take 20 mg by mouth daily with breakfast.   08/20/2021   spironolactone (ALDACTONE) 25 MG tablet Take 12.5 mg by mouth daily.   08/20/2021   torsemide (DEMADEX) 20 MG tablet Take 40 mg by mouth daily.   08/20/2021   Ampicillin-Sulbactam 3 g in sodium chloride 0.9 % 100 mL Inject 3 g into the vein every 6 (six) hours.       Current medications: Current Facility-Administered Medications  Medication Dose Route Frequency Provider Last Rate Last Admin   acetaminophen (TYLENOL) tablet 650 mg  650 mg Oral Q4H Rust-Chester, Britton L, NP   650 mg at 08/21/21 1211  Or   acetaminophen (TYLENOL) suppository 650 mg  650 mg Rectal Q4H Rust-Chester, Huel Cote, NP   650 mg at 08/21/21 0607   [START ON 08/22/2021] acetaminophen (TYLENOL) tablet 650 mg  650 mg Oral Q6H PRN Rust-Chester, Huel Cote, NP       azithromycin (ZITHROMAX) 500 mg in sodium chloride 0.9 % 250 mL IVPB  500 mg Intravenous Q24H Oswald Hillock, RPH   Stopped at 08/21/21 1020   carvedilol (COREG) tablet 25 mg  25 mg Oral Daily Rust-Chester, Huel Cote, NP   25 mg at 08/21/21 1033   cefTRIAXone (ROCEPHIN) 2 g in sodium chloride 0.9 % 100 mL IVPB  2 g Intravenous Q24H Rust-Chester, Huel Cote, NP   Stopped at 08/21/21 1103   Chlorhexidine Gluconate Cloth 2 % PADS 6 each  6 each Topical Q0600 Ottie Glazier, MD   6 each at 08/21/21 0414   dextrose 10 % infusion    Intravenous Continuous Rust-Chester, Huel Cote, NP 30 mL/hr at 08/21/21 1407 Infusion Verify at 08/21/21 1407   docusate sodium (COLACE) capsule 100 mg  100 mg Oral BID PRN Rust-Chester, Huel Cote, NP       famotidine (PEPCID) IVPB 20 mg premix  20 mg Intravenous Once Ottie Glazier, MD 100 mL/hr at 08/21/21 1407 20 mg at 08/21/21 1407   heparin injection 5,000 Units  5,000 Units Subcutaneous Q8H Rust-Chester, Huel Cote, NP   5,000 Units at 08/21/21 1429   hydrALAZINE (APRESOLINE) injection 10-20 mg  10-20 mg Intravenous Q4H PRN Rust-Chester, Huel Cote, NP       hydroxychloroquine (PLAQUENIL) tablet 400 mg  400 mg Oral Daily Rust-Chester, Huel Cote, NP   400 mg at 08/21/21 1212   lacosamide (VIMPAT) tablet 150 mg  150 mg Oral BID Rust-Chester, Toribio Harbour L, NP   150 mg at 08/21/21 1030   levalbuterol (XOPENEX) nebulizer solution 1.25 mg  1.25 mg Nebulization Q6H PRN Rust-Chester, Toribio Harbour L, NP       LORazepam (ATIVAN) injection 2 mg  2 mg Intravenous Q1H PRN Rust-Chester, Britton L, NP       methylPREDNISolone sodium succinate (SOLU-MEDROL) 40 mg/mL injection 40 mg  40 mg Intravenous Daily Ottie Glazier, MD   40 mg at 08/21/21 1207   pantoprazole (PROTONIX) injection 40 mg  40 mg Intravenous Q24H Rust-Chester, Britton L, NP   40 mg at 08/21/21 1031   polyethylene glycol (MIRALAX / GLYCOLAX) packet 17 g  17 g Oral Daily PRN Rust-Chester, Toribio Harbour L, NP       pravastatin (PRAVACHOL) tablet 20 mg  20 mg Oral Daily Rust-Chester, Britton L, NP   20 mg at 08/21/21 1031      Allergies: Allergies  Allergen Reactions   Amlodipine Swelling      Past Medical History: Past Medical History:  Diagnosis Date   Hypertension    Lupus ([place])    Lupus nephritis ([place])    Seizure ([place])      Past Surgical History: History reviewed. No pertinent surgical history.   Family History: Family History  Problem Relation Age of Onset   Hyperlipidemia Maternal Grandmother        a. many medical problems,  unknown per prior note     Social History: Social History   Socioeconomic History   Marital status: Single    Spouse name: Not on file   Number of children: Not on file   Years of education: Not on file   Highest education level: Not on file  Occupational  History   Not on file  Tobacco Use   Smoking status: Never   Smokeless tobacco: Never  Substance and Sexual Activity   Alcohol use: Never   Drug use: Never   Sexual activity: Yes    Birth control/protection: OCP  Other Topics Concern   Not on file  Social History Narrative   Not on file   Social Determinants of Health   Financial Resource Strain: Not on file  Food Insecurity: Not on file  Transportation Needs: Not on file  Physical Activity: Not on file  Stress: Not on file  Social Connections: Not on file  Intimate Partner Violence: Not on file     Review of Systems: Review of Systems  Constitutional:  Negative for chills, fever and malaise/fatigue.  HENT:  Negative for congestion, hearing loss and tinnitus.   Eyes:  Negative for blurred vision and double vision.  Respiratory:  Positive for shortness of breath. Negative for cough and sputum production.   Cardiovascular:  Negative for chest pain, palpitations and orthopnea.  Gastrointestinal:  Negative for diarrhea, nausea and vomiting.  Genitourinary:  Negative for dysuria, frequency and urgency.  Musculoskeletal:  Negative for myalgias.  Skin:  Negative for itching and rash.  Neurological:  Positive for seizures. Negative for dizziness and focal weakness.  Endo/Heme/Allergies:  Negative for polydipsia. Does not bruise/bleed easily.  Psychiatric/Behavioral:  The patient is not nervous/anxious.     Vital Signs: Blood pressure 121/79, pulse (!) 115, temperature (!) 100.6 F (38.1 C), temperature source Oral, resp. rate (!) 24, height _0  (1.676 m), weight 72.6 kg, SpO2 99 %.  Weight trends: Filed Weights   08/21/21 0058  Weight: 72.6 kg      Physical Exam: General: No acute distress  Head: Normocephalic, atraumatic. Moist oral mucosal membranes, high flow nasal cannula on  Eyes: Anicteric  Neck: Supple  Lungs:  Diminished at bases normal effort  Heart: S1S2 no obvious rub  Abdomen:  Soft, nontender, bowel sounds present  Extremities: No peripheral edema.  Neurologic: Awake, alert, following commands  Skin: No acute rash  Access: No dialysis access    Lab results: Basic Metabolic Panel: Recent Labs  Lab 08/21/21 0100 08/21/21 0531  NA 137 139  K 5.1 5.1  CL 110 112*  CO2 17* 19*  GLUCOSE 80 157*  BUN 86* 88*  CREATININE 7.89* 7.90*  CALCIUM 7.7* 7.3*  MG  --  1.7  PHOS  --  4.4    Liver Function Tests: Recent Labs  Lab 08/21/21 0100  AST 20  ALT 15  ALKPHOS 43  BILITOT 0.6  PROT 6.2*  ALBUMIN 3.2*   No results for input(s): LIPASE, AMYLASE in the last 168 hours. No results for input(s): AMMONIA in the last 168 hours.  CBC: Recent Labs  Lab 08/21/21 0100 08/21/21 0531 08/21/21 0957  WBC 20.1* 13.6*  --   NEUTROABS 18.5*  --   --   HGB 7.4* 6.3* 6.4*  HCT 23.4* 20.1* 19.9*  MCV 87.6 87.0  --   PLT 188 128*  --     Cardiac Enzymes: Recent Labs  Lab 08/21/21 0957  CKTOTAL 75    BNP: Invalid input(s): POCBNP  CBG: Recent Labs  Lab 08/21/21 0048 08/21/21 0510 08/21/21 0542 08/21/21 0822  GLUCAP 74 44* 115* 75    Microbiology: Results for orders placed or performed during the hospital encounter of 08/21/21  Resp Panel by RT-PCR (Flu A&B, Covid) Nasopharyngeal Swab     Status: None  Collection Time: 08/21/21  1:00 AM   Specimen: Nasopharyngeal Swab; Nasopharyngeal(NP) swabs in vial transport medium  Result Value Ref Range Status   SARS Coronavirus 2 by RT PCR NEGATIVE NEGATIVE Final    Comment: (NOTE) SARS-CoV-2 target nucleic acids are NOT DETECTED.  The SARS-CoV-2 RNA is generally detectable in upper respiratory specimens during the acute phase of infection. The  lowest concentration of SARS-CoV-2 viral copies this assay can detect is 138 copies/mL. A negative result does not preclude SARS-Cov-2 infection and should not be used as the sole basis for treatment or other patient management decisions. A negative result may occur with  improper specimen collection/handling, submission of specimen other than nasopharyngeal swab, presence of viral mutation(s) within the areas targeted by this assay, and inadequate number of viral copies(<138 copies/mL). A negative result must be combined with clinical observations, patient history, and epidemiological information. The expected result is Negative.  Fact Sheet for Patients:  EntrepreneurPulse.com.au  Fact Sheet for Healthcare Providers:  IncredibleEmployment.be  This test is no t yet approved or cleared by the [place] FDA and  has been authorized for detection and/or diagnosis of SARS-CoV-2 by FDA under an Emergency Use Authorization (EUA). This EUA will remain  in effect (meaning this test can be used) for the duration of the COVID-19 declaration under Section 564(b)(1) of the Act, 21 U.S.C.section 360bbb-3(b)(1), unless the authorization is terminated  or revoked sooner.       Influenza A by PCR NEGATIVE NEGATIVE Final   Influenza B by PCR NEGATIVE NEGATIVE Final    Comment: (NOTE) The Xpert Xpress SARS-CoV-2/FLU/RSV plus assay is intended as an aid in the diagnosis of influenza from Nasopharyngeal swab specimens and should not be used as a sole basis for treatment. Nasal washings and aspirates are unacceptable for Xpert Xpress SARS-CoV-2/FLU/RSV testing.  Fact Sheet for Patients: EntrepreneurPulse.com.au  Fact Sheet for Healthcare Providers: IncredibleEmployment.be  This test is not yet approved or cleared by the [place] FDA and has been authorized for detection and/or diagnosis of SARS-CoV-2 by FDA under  an Emergency Use Authorization (EUA). This EUA will remain in effect (meaning this test can be used) for the duration of the COVID-19 declaration under Section 564(b)(1) of the Act, 21 U.S.C. section 360bbb-3(b)(1), unless the authorization is terminated or revoked.  Performed at [facility], [address] [place], [place] [address]   Blood Culture (routine x 2)     Status: None (Preliminary result)   Collection Time: 08/21/21  1:04 AM   Specimen: BLOOD  Result Value Ref Range Status   Specimen Description BLOOD RIGHT FOREARM  Final   Special Requests   Final    BOTTLES DRAWN AEROBIC AND ANAEROBIC Blood Culture results may not be optimal due to an inadequate volume of blood received in culture bottles   Culture   Final    NO GROWTH < 12 HOURS Performed at [facility], [address], [place], [place] [address]    Report Status PENDING  Incomplete  Blood Culture (routine x 2)     Status: None (Preliminary result)   Collection Time: 08/21/21  1:05 AM   Specimen: BLOOD  Result Value Ref Range Status   Specimen Description BLOOD RIGHT HAND  Final   Special Requests   Final    BOTTLES DRAWN AEROBIC AND ANAEROBIC Blood Culture adequate volume   Culture   Final    NO GROWTH < 12 HOURS Performed at [facility], [place],  [place] [address]    Report Status PENDING  Incomplete  MRSA Next Gen by PCR, Nasal     Status: None   Collection Time: 08/21/21  4:50 AM   Specimen: Nasal Mucosa; Nasal Swab  Result Value Ref Range Status   MRSA by PCR Next Gen NOT DETECTED NOT DETECTED Final    Comment: (NOTE) The GeneXpert MRSA Assay (FDA approved for NASAL specimens only), is one component of a comprehensive MRSA colonization surveillance program. It is not intended to diagnose MRSA infection nor to guide or monitor treatment for MRSA infections. Test performance is not FDA approved in patients less than 20 years old. Performed at [facility], [address] [place], [place] [address]     Coagulation Studies: Recent Labs    08/21/21 0101  LABPROT 13.6  INR 1.0    Urinalysis: Recent Labs    08/21/21 0205  COLORURINE YELLOW  LABSPEC 1.020  PHURINE 8.5*  GLUCOSEU 100*  HGBUR SMALL*  BILIRUBINUR NEGATIVE  KETONESUR NEGATIVE  PROTEINUR >300*  NITRITE NEGATIVE  LEUKOCYTESUR TRACE*      Imaging: CT HEAD WO CONTRAST (5MM)  Result Date: 08/21/2021 CLINICAL DATA:  Status post seizure. EXAM: CT HEAD WITHOUT CONTRAST TECHNIQUE: Contiguous axial images were obtained from the base of the skull through the vertex without intravenous contrast. COMPARISON:  None. FINDINGS: Brain: No evidence of acute infarction, hemorrhage, hydrocephalus, extra-axial collection or mass lesion/mass effect. Vascular: No hyperdense vessel or unexpected calcification. Skull: Normal. Negative for fracture or focal lesion. Sinuses/Orbits: No acute finding. Other: None. IMPRESSION: No acute intracranial pathology. Electronically Signed   By: Virgina Norfolk M.D.   On: 08/21/2021 03:49   CT Cervical Spine Wo Contrast  Result Date: 08/21/2021 CLINICAL DATA:  Status post seizure. EXAM: CT CERVICAL SPINE WITHOUT CONTRAST TECHNIQUE: Multidetector CT imaging of the cervical spine was performed without intravenous contrast. Multiplanar CT image reconstructions were also generated. COMPARISON:  None. FINDINGS: Alignment: Normal. Skull base and vertebrae: No acute fracture. No primary bone lesion or focal pathologic process. Soft tissues and spinal canal: No prevertebral fluid or swelling. No visible canal hematoma. Disc levels: Normal multilevel endplates are seen with normal multilevel intervertebral disc spaces. Normal bilateral multilevel facet joints are noted. Upper chest: Negative. Other: None. IMPRESSION: No acute fracture or subluxation of the cervical spine. Electronically Signed   By: Virgina Norfolk M.D.   On: 08/21/2021 03:51   DG  Chest Portable 1 View  Result Date: 08/21/2021 CLINICAL DATA:  Initial evaluation for acute fever, seizure. EXAM: PORTABLE CHEST 1 VIEW COMPARISON:  Prior radiograph from 12/09/2020 FINDINGS: Marked cardiomegaly, stable. Mediastinal silhouette within normal limits. Lungs normally inflated. Prominent patchy airspace opacity seen throughout the right upper and lower lobes, concerning for multifocal pneumonia. Possible patchy involvement of the left lung base as well. Underlying mild perihilar vascular congestion without overt pulmonary edema. Probable trace bilateral pleural effusions. No pneumothorax. No acute osseous finding. IMPRESSION: 1. Extensive patchy airspace disease throughout the right upper and lower lobes, concerning for multifocal pneumonia. 2. Underlying cardiomegaly with perihilar vascular congestion without overt pulmonary edema. 3. Probable trace bilateral pleural effusions. Electronically Signed   By: Jeannine Boga M.D.   On: 08/21/2021 01:18     Assessment & Plan: Pt is a 20 y.o. 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    1.  Acute kidney injury/chronic kidney disease stage IV/class IV lupus nephritis/proteinuria.  Patient has been actively followed by [facility] nephrology.  In the past has been treated with Cytoxan.  Given her young age it was felt that further Cytoxan treatment should be avoided.  In addition recently she has been receiving Benlysta.  Patient and her mother report adherence with the medication.  Patient with significant deterioration of renal function as creatinine is now above 7.  We had a conversation  about the potential for renal placement therapy.  At this point time the patient does not want to proceed with renal replacement therapy.  Given her complex case and young age patient may potentially benefit from transfer to [facility].  Pulmonary critical care has made the call however no beds available.  We will continue to monitor her renal parameters very closely.  2.  Acute respiratory failure.  Currently on high flow nasal cannula.  Hopefully patient can avoid being placed on the ventilator.  3.  Anemia of chronic kidney disease in the setting of lupus erythematosus and prior history of hemolytic anemia.  Case discussed with pulmonary/critical care.  They are attempting to avoid transfusion now to avoid any further volume overload as she is currently on high flow nasal cannula.  Monitor CBC very closely.  4.  Very complex case, thanks for consultation.

## 2021-08-21 NOTE — Progress Notes (Signed)
CODE SEPSIS - PHARMACY COMMUNICATION  **Broad Spectrum Antibiotics should be administered within 1 hour of Sepsis diagnosis**  Time Code Sepsis Called/Page Received: 11/26 @ 0051  Antibiotics Ordered: Cefepime, Vancomycin   Time of 1st antibiotic administration: Cefepime 2 gm IV X 1 on 11/26 @ 0155  Additional action taken by pharmacy:   If necessary, Name of Provider/Nurse Contacted:     Jahmiyah Dullea D ,PharmD Clinical Pharmacist  08/21/2021  2:53 AM

## 2021-08-22 ENCOUNTER — Inpatient Hospital Stay (HOSPITAL_COMMUNITY)
Admit: 2021-08-22 | Discharge: 2021-08-22 | Disposition: A | Discharge status: Home / Self Care | Payer: Medicaid Other | Attending: Pulmonary Disease | Admitting: Pulmonary Disease

## 2021-08-22 ENCOUNTER — Inpatient Hospital Stay: Payer: Medicaid Other

## 2021-08-22 ENCOUNTER — Inpatient Hospital Stay
Admit: 2021-08-22 | Discharge: 2021-08-22 | Disposition: A | Discharge status: Home / Self Care | Payer: Medicaid Other | Attending: Pulmonary Disease | Admitting: Pulmonary Disease

## 2021-08-22 DIAGNOSIS — I5023 Acute on chronic systolic (congestive) heart failure: Secondary | ICD-10-CM | POA: Diagnosis not present

## 2021-08-22 DIAGNOSIS — R6520 Severe sepsis without septic shock: Secondary | ICD-10-CM | POA: Diagnosis not present

## 2021-08-22 DIAGNOSIS — I420 Dilated cardiomyopathy: Secondary | ICD-10-CM | POA: Diagnosis not present

## 2021-08-22 DIAGNOSIS — I3139 Other pericardial effusion (noninflammatory): Secondary | ICD-10-CM

## 2021-08-22 DIAGNOSIS — A419 Sepsis, unspecified organism: Principal | ICD-10-CM

## 2021-08-22 LAB — BASIC METABOLIC PANEL
Anion gap: 11 (ref 5–15)
BUN: 97 mg/dL — ABNORMAL HIGH (ref 6–20)
CO2: 20 mmol/L — ABNORMAL LOW (ref 22–32)
Calcium: 7.7 mg/dL — ABNORMAL LOW (ref 8.9–10.3)
Chloride: 106 mmol/L (ref 98–111)
Creatinine, Ser: 8.78 mg/dL — ABNORMAL HIGH (ref 0.44–1.00)
GFR, Estimated: 6 mL/min — ABNORMAL LOW (ref >60)
Glucose, Bld: 133 mg/dL — ABNORMAL HIGH (ref 70–99)
Potassium: 6.1 mmol/L — ABNORMAL HIGH (ref 3.5–5.1)
Sodium: 137 mmol/L (ref 135–145)

## 2021-08-22 LAB — ECHOCARDIOGRAM COMPLETE
AR max vel: 3.01 cm2
AV Area VTI: 2.73 cm2
AV Area mean vel: 2.97 cm2
AV Mean grad: 2 mmHg
AV Peak grad: 2.7 mmHg
Ao pk vel: 0.82 m/s
Area-P 1/2: 6.07 cm2
Height: 66 in
MV VTI: 2.75 cm2
S' Lateral: 4.4 cm
Weight: 2500.9 oz

## 2021-08-22 LAB — CBC
HCT: 18.2 % — ABNORMAL LOW (ref 36.0–46.0)
Hemoglobin: 5.9 g/dL — ABNORMAL LOW (ref 12.0–15.0)
MCH: 28.1 pg (ref 26.0–34.0)
MCHC: 32.4 g/dL (ref 30.0–36.0)
MCV: 86.7 fL (ref 80.0–100.0)
Platelets: 120 10*3/uL — ABNORMAL LOW (ref 150–400)
RBC: 2.1 MIL/uL — ABNORMAL LOW (ref 3.87–5.11)
RDW: 13.2 % (ref 11.5–15.5)
WBC: 32.8 10*3/uL — ABNORMAL HIGH (ref 4.0–10.5)
nRBC: 0 % (ref 0.0–0.2)

## 2021-08-22 LAB — GLUCOSE, CAPILLARY
Glucose-Capillary: 118 mg/dL — ABNORMAL HIGH (ref 70–99)
Glucose-Capillary: 115 mg/dL — ABNORMAL HIGH (ref 70–99)
Glucose-Capillary: 111 mg/dL — ABNORMAL HIGH (ref 70–99)
Glucose-Capillary: 116 mg/dL — ABNORMAL HIGH (ref 70–99)

## 2021-08-22 LAB — PHOSPHORUS: Phosphorus: 5.8 mg/dL — ABNORMAL HIGH (ref 2.5–4.6)

## 2021-08-22 LAB — HEMOGLOBIN AND HEMATOCRIT, BLOOD
HCT: 23.2 % — ABNORMAL LOW (ref 36.0–46.0)
Hemoglobin: 7.8 g/dL — ABNORMAL LOW (ref 12.0–15.0)

## 2021-08-22 LAB — URINE CULTURE

## 2021-08-22 LAB — PREPARE RBC (CROSSMATCH)

## 2021-08-22 LAB — PROCALCITONIN: Procalcitonin: >150 ng/mL

## 2021-08-22 LAB — POTASSIUM: Potassium: 5.8 mmol/L — ABNORMAL HIGH (ref 3.5–5.1)

## 2021-08-22 LAB — MAGNESIUM: Magnesium: 2.9 mg/dL — ABNORMAL HIGH (ref 1.7–2.4)

## 2021-08-22 MED ORDER — METOPROLOL TARTRATE 5 MG/5ML IV SOLN: 5.0000 mg | Freq: Four times a day (QID) | INTRAVENOUS | Status: DC | PRN

## 2021-08-22 MED ORDER — PATIROMER SORBITEX CALCIUM 8.4 G PO PACK: 16.8000 g | PACK | Freq: Every day | ORAL | Status: DC

## 2021-08-22 MED ORDER — CLONIDINE HCL 0.3 MG/24HR TD PTWK
0.3000 mg | MEDICATED_PATCH | TRANSDERMAL | Status: DC
  Administered 2021-08-22: 23:00:00 0.3 mg via TRANSDERMAL
  Filled 2021-08-22: qty 1

## 2021-08-22 MED ORDER — HYDRALAZINE HCL 20 MG/ML IJ SOLN
10.0000 mg | INTRAMUSCULAR | Status: DC | PRN
Start: 2021-08-22 — End: 2021-08-23
  Administered 2021-08-23: 01:00:00 20 mg via INTRAVENOUS
  Filled 2021-08-22: qty 1

## 2021-08-22 MED ORDER — PATIROMER SORBITEX CALCIUM 8.4 G PO PACK
8.4000 g | PACK | Freq: Every day | ORAL | Status: DC
  Filled 2021-08-22 (×3): qty 1

## 2021-08-22 MED ORDER — BISMUTH SUBSALICYLATE 262 MG PO CHEW
524.0000 mg | CHEWABLE_TABLET | ORAL | Status: DC | PRN
  Administered 2021-08-22: 20:00:00 524 mg via ORAL
  Filled 2021-08-22 (×2): qty 2

## 2021-08-22 MED ORDER — INSULIN ASPART 100 UNIT/ML IV SOLN
10.0000 [IU] | Freq: Once | INTRAVENOUS | Status: AC
  Administered 2021-08-22: 22:00:00 10 [IU] via INTRAVENOUS
  Filled 2021-08-22: qty 0.1

## 2021-08-22 MED ORDER — SODIUM CHLORIDE 0.9% IV SOLUTION: Freq: Once | INTRAVENOUS | Status: AC

## 2021-08-22 MED ORDER — NIFEDIPINE ER OSMOTIC RELEASE 30 MG PO TB24
90.0000 mg | ORAL_TABLET | Freq: Every day | ORAL | Status: DC
  Filled 2021-08-22: qty 1

## 2021-08-22 MED ORDER — CLONIDINE HCL 0.2 MG/24HR TD PTWK
0.2000 mg | MEDICATED_PATCH | TRANSDERMAL | Status: DC
  Filled 2021-08-22: qty 1

## 2021-08-22 MED ORDER — ONDANSETRON HCL 4 MG/2ML IJ SOLN
4.0000 mg | Freq: Once | INTRAMUSCULAR | Status: AC
  Administered 2021-08-22: 10:00:00 4 mg via INTRAVENOUS
  Filled 2021-08-22: qty 2

## 2021-08-22 MED ORDER — ONDANSETRON HCL 4 MG/2ML IJ SOLN
4.0000 mg | Freq: Four times a day (QID) | INTRAMUSCULAR | Status: DC | PRN
  Administered 2021-08-23: 13:00:00 4 mg via INTRAVENOUS
  Filled 2021-08-22 (×2): qty 2

## 2021-08-22 MED ORDER — SODIUM CHLORIDE 0.9 % IV SOLN
150.0000 mg | Freq: Two times a day (BID) | INTRAVENOUS | Status: DC
  Administered 2021-08-22 – 2021-08-24 (×4): 150 mg via INTRAVENOUS
  Filled 2021-08-22 (×5): qty 15

## 2021-08-22 MED ORDER — ONDANSETRON HCL 4 MG/2ML IJ SOLN
INTRAMUSCULAR | Status: AC
  Administered 2021-08-22: 22:00:00 4 mg
  Filled 2021-08-22: qty 2

## 2021-08-22 MED ORDER — METHYLPREDNISOLONE SODIUM SUCC 125 MG IJ SOLR
80.0000 mg | Freq: Two times a day (BID) | INTRAMUSCULAR | Status: DC
  Administered 2021-08-22 – 2021-08-26 (×8): 80 mg via INTRAVENOUS
  Filled 2021-08-22 (×8): qty 2

## 2021-08-22 MED ORDER — CLONIDINE HCL ER 0.1 MG PO TB12
0.1000 mg | ORAL_TABLET | Freq: Four times a day (QID) | ORAL | Status: DC
Start: 2021-08-22 — End: 2021-08-22

## 2021-08-22 MED ORDER — METRONIDAZOLE 500 MG/100ML IV SOLN
500.0000 mg | Freq: Two times a day (BID) | INTRAVENOUS | Status: DC
  Administered 2021-08-23: 01:00:00 500 mg via INTRAVENOUS
  Filled 2021-08-22 (×2): qty 100

## 2021-08-22 MED ORDER — SODIUM BICARBONATE 8.4 % IV SOLN
50.0000 meq | Freq: Once | INTRAVENOUS | Status: AC
  Administered 2021-08-22: 07:00:00 50 meq via INTRAVENOUS
  Filled 2021-08-22: qty 50

## 2021-08-22 MED ORDER — INSULIN ASPART 100 UNIT/ML IV SOLN
10.0000 [IU] | Freq: Once | INTRAVENOUS | Status: AC
  Administered 2021-08-22: 08:00:00 10 [IU] via INTRAVENOUS
  Filled 2021-08-22: qty 0.1

## 2021-08-22 MED ORDER — DEXTROSE 50 % IV SOLN
1.0000 | Freq: Once | INTRAVENOUS | Status: AC
  Administered 2021-08-22: 22:00:00 50 mL via INTRAVENOUS
  Filled 2021-08-22: qty 50

## 2021-08-22 MED ORDER — NIFEDIPINE ER OSMOTIC RELEASE 30 MG PO TB24
90.0000 mg | ORAL_TABLET | Freq: Every day | ORAL | Status: DC
Start: 2021-08-22 — End: 2021-08-27
  Administered 2021-08-22 – 2021-08-26 (×4): 90 mg via ORAL
  Filled 2021-08-22 (×6): qty 3

## 2021-08-22 MED ORDER — DIPHENHYDRAMINE HCL 50 MG/ML IJ SOLN: 25.0000 mg | Freq: Once | INTRAMUSCULAR | Status: DC

## 2021-08-22 MED ORDER — DEXTROSE 50 % IV SOLN
1.0000 | Freq: Once | INTRAVENOUS | Status: AC
  Administered 2021-08-22: 08:00:00 50 mL via INTRAVENOUS
  Filled 2021-08-22: qty 50

## 2021-08-22 MED ORDER — DIPHENHYDRAMINE HCL 50 MG/ML IJ SOLN
50.0000 mg | Freq: Once | INTRAMUSCULAR | Status: AC
  Administered 2021-08-22: 10:00:00 50 mg via INTRAVENOUS
  Filled 2021-08-22: qty 1

## 2021-08-22 MED ORDER — CALCIUM GLUCONATE-NACL 1-0.675 GM/50ML-% IV SOLN
1.0000 g | Freq: Once | INTRAVENOUS | Status: AC
  Administered 2021-08-22: 07:00:00 1000 mg via INTRAVENOUS
  Filled 2021-08-22: qty 50

## 2021-08-22 MED ORDER — CLONIDINE HCL 0.1 MG PO TABS
0.1000 mg | ORAL_TABLET | Freq: Four times a day (QID) | ORAL | Status: DC
  Administered 2021-08-22 (×2): 0.1 mg via ORAL
  Filled 2021-08-22 (×4): qty 1

## 2021-08-22 NOTE — Progress Notes (Signed)
PHARMACY CONSULT NOTE - FOLLOW UP  Pharmacy Consult for Electrolyte Monitoring and Replacement   Recent Labs: Potassium (mmol/L)  Date Value  08/22/2021 6.1 (H)   Magnesium (mg/dL)  Date Value  08/22/2021 2.9 (H)   Calcium (mg/dL)  Date Value  08/22/2021 7.7 (L)   Albumin (g/dL)  Date Value  08/21/2021 3.2 (L)   Phosphorus (mg/dL)  Date Value  08/22/2021 5.8 (H)   Sodium (mmol/L)  Date Value  08/22/2021 137     Assessment: 20 year old female presenting to [facility] ED from home via EMS on 08/21/2021 after reports of an 8 minute seizure at home.    Goal of Therapy:  Elctrolytes WNL   Plan:  K+ elevated medical team ordered patiromer.  F/u with AM labs.   Oswald Hillock ,PharmD Clinical Pharmacist 08/22/2021 8:29 AM

## 2021-08-22 NOTE — Consult Note (Signed)
Reason for Consult: Lupus flare  Referring Physician: Dr Tollie Eth   HPI: 20 year old African-American female.  Complicated story.  History of systemic lupus since 2017.  Manifestations include class IV glomerulonephritis.  Polyarthritis.  Autoimmune anemia.  Serositis with pericarditis pleural effusions.  Congestive heart failure with myocarditis improving with treatment with steroids She had a complicated course earlier this year with admission for seizures in January.  Had PrESS on the scan.  Medicines versus lupus.  She also had cardiomyopathy with ejection fraction down to 20%.  With treatment of her lupus or ejection fraction in September was 55% Has had cyclophosphamide in the past as well as Cytoxan.  She ended a several course taper of Cytoxan in July and was placed on subcu Benlysta.  She has not had a follow-up since July.  Prednisone dose has been 15.  Plaquenil 400.  Previously on PCP prophylaxis She several days ago had recurrent seizure.  Has been very tired.  She has been transfused.  She has been given 40 mg dose of Solu-Medrol daily.  Creatinine this admission rose to 7.  Previously was at 3.  Urinalysis has proteinuria and red cells and white cells.  Chest x-ray shows infiltrates and cardiomegaly Concern for infection.  Was started on antibiotics. No Raynaud's.  No recent joint pain.  Occasionally has knee pain when she has a flare.  She has not had fever.  Has been minimal cough.  She has had mild hyperpigmentation in the forehead sometimes flares with lupus Not currently having any pain or shortness of breath She has been on seizure medication lacosimide after previously breaking through [place]   PMH: Per HPI  SURGICAL HISTORY: No joint surgery Family History: No history of lupus  Social History: No cigarettes or alcohol  Allergies:  Allergies  Allergen Reactions   Amlodipine Swelling    Medications: Scheduled:  carvedilol  25 mg Oral Daily    Chlorhexidine Gluconate Cloth  6 each Topical Q0600   cloNIDine  0.1 mg Oral QID   heparin  5,000 Units Subcutaneous Q8H   hydroxychloroquine  400 mg Oral Daily   lacosamide  150 mg Oral BID   methylPREDNISolone (SOLU-MEDROL) injection  40 mg Intravenous Daily   NIFEdipine  90 mg Oral Daily   pantoprazole (PROTONIX) IV  40 mg Intravenous Q24H   patiromer  8.4 g Oral Daily   pravastatin  20 mg Oral Daily        ROS: Per HPI   PHYSICAL EXAM: Blood pressure (!) 131/100, pulse (!) 105, temperature 98.7 F (37.1 C), temperature source Oral, resp. rate (!) 31, height 5\' 6"  (1.676 m), weight 70.9 kg, SpO2 94 %. Somnolent female.  Does not really want to engage me.  History from the mother.  Skin with mild hyperpigmentation above the eyebrows.  1 abrasion of above the left eyebrow.  Oropharynx clear.  No significant rales.  No significant murmur gallop.  No significant edema Musculoskeletal: No synovitis.  No telangiectasias.  No definite knee effusion  Assessment: Systemic lupus with recent flare.  Suspect breaking through [place].  Now with manifestations of: -Worsening renal failure with proteinuria active sediment -Anemia -Breakthrough seizures despite antiseizure regimen and presumed compliance -Cardiomyopathy.  Probably inflammatory.  -Prior Cytoxan, CellCept, Benlysta, rituximab, chronic steroids  Previously improved with Cytoxan Prior PRESS  -  Recommendations: Boost IV steroids to 80 mg Solu-Medrol wice daily.  We will check complement anti-DNA May need dialysis Hesitant to reinitiate Cytoxan.  If she calms  down with IV steroids would best to her ongoing nephrologist and rheumatologist regarding next step. And other IV options for chronic treatment if since she has failed [address] Marcello Fennel W 08/22/2021, 2:27 PM    Lupus flare

## 2021-08-22 NOTE — Progress Notes (Addendum)
NAME:  Melinda Steele, MRN:  [id number], DOB:  07/28/2001, LOS: 1 ADMISSION DATE:  08/21/2021, CONSULTATION DATE:  08/21/2021 REFERRING MD:  Dr. Leonides Schanz, CHIEF COMPLAINT:  Seizures   History of Present Illness:  20 year old female presenting to [facility] ED from home via EMS on 08/21/2021 after reports of an 8 minute seizure at home.  Per ED documentation EMS reported the patient was tachycardic hypertensive and postictal upon arrival.  CBG check was in the 130's.  History provided by mother and friend at bedside, the patient's friend stated he was on the phone with her when she became unresponsive and sounded as though she was " gasping and choking".  The patient's mother woke up around the same time because she her her daughter gasping and fall out of bed.  Upon arrival to the patient's room she was found on the floor with what the mother is reporting as tonic-clonic seizures: Eyes rolled back into her head with all extremities stiffened.  Per the patient's report the last thing she remembers is vomiting.  Patient also admits to feeling generalized fatigue for the last 2 to 3 days but denies any localized symptoms.  She denies nausea/vomiting/diarrhea/abdominal pain, dysuria/polyuria, shortness of breath/productive cough, fever/chills, poor p.o. intake, weight gain/increased swelling, orthopnea and patient denies any pain at this time. PCCM consulted for admission due to complexity of patient & high risk for intubation.  Significant Hospital Events: Including procedures, antibiotic start and stop dates in addition to other pertinent events   08/21/21- patient admitted to ICU with Acute hypoxic respiratory failure on HHFNC, Sepsis s/t suspected aspiration pneumonia due to seizures & acute renal failure on CKD in context of severe SLE. 08/22/21- Patient clinically improved, she is now on room air off BIPAP.  Mother has requested transfer to [facility] but after multiple attempts [facility] at full capacity unable to accept  transfer.  Patient is to have blood transfusion today. She is improved to step down unit.  Rheumatology and nephrology following with plan for possible HD.   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 rate (!) 27, height _0  (1.676 m), weight 70.9 kg, SpO2 94 %.    FiO2 (%):  [35 %-45 %] 35 %   Intake/Output Summary (Last 24 hours) at 08/22/2021 0916 Last data filed at 08/22/2021 0800 Gross per 24 hour  Intake 1691.41 ml  Output 800 ml  Net 891.41 ml   Filed Weights   08/21/21 0058 08/22/21 0500  Weight: 72.6 kg 70.9 kg    Examination: General: Adult female, , lying in bed  NAD on room air HEENT: MM pink/moist, anicteric, atraumatic, neck supple Neuro: A&O x 4, able to follow commands, PERRL +3, MAE CV: s1s2 RRR, ST on monitor, no r/m/g Pulm: Regular, non labored on RA  breath sounds diminished throughout GI: soft, rounded, non tender, bs x 4 Skin: limited exam- no rashes/lesions noted Extremities: warm/dry, pulses + 2 R/P, no edema noted    Assessment & Plan:   Sepsis- present on admission      Pneumonia - possible aspiration pneumonia while vomiting during epileptic seizure            -viral panel COVID, fluA&B, RSV, RVP 20 species negative  Acute Hypoxic Respiratory Failure -RESOLVED           Present on admission - multifocal pna                   -infectious workup negative thus far - Supplemental O2 to maintain SpO2 > 90% - Intermittent chest x-ray & ABG PRN - Ensure adequate pulmonary hygiene  - F/u cultures,  - Continue CAP/Aspiration Pna coverage: ceftriaxone & zithromax - bronchodilators PRN   Systemic Lupus erythematous  Rheumatology  consultation           Previously seen at [facility] with detailed documentation of lupus history as below:  Melinda Steele is a 20 y.o. female with lupus and lupus nephritis. Her history is outlined below. The most recent history from 2022 is as follows.  After being lost to follow-up through 2020 she was hospitalized from January 8 through January 14 of this year after presenting with a pericardial effusion and tamponade requiring pericardiocentesis and drain placement. She had acute kidney injury with active lupus nephritis, cytopenias, arthritis, and rash. She received IV methylprednisolone and started Cytoxan due to her lupus dosing.She received her second dose of Cytoxan on 10/19/2020 and then presented hospital in status epilepticus secondary to PRES on 10/21/2020. She was hospitalized through February 17 for PRES, volume overload and blood pressure management. She was also diagnosed with COVID during this hospitalization and treated with remdesivir. She received her third dose of Cytoxan on 2 8. She received her fourth and fifth doses of Cytoxan as an outpatient in March. She also received 1 dose of rituximab 1000 mg x 1.  She was readmitted from March 17 through March 21After transfer from an outside hospital due to pulmonary edema and respiratory failure decreased LV function with an EF of 25 to 30%. Upon discharge she received her sixth dose of cyclophosphamide . The family refused any further rituximab, concerned that it caused her previous hospitalization.  The patient was admitted again from 5/9 through 02/13/21 with chest pain, AKI, orthopnea. Ultimately found to have now have class IV nephritis on renal biopsy, HFrEF with an EF of 20 to 25% and uncontrolled hypertension. Her hospital course was complicated by brief MICU stay for hypertensive emergency and subsequent PRES. She was treated with IV methylprednisolone and started again on IV cyclophosphamide prior to discharge. She was discharged on 60 mg  of prednisone.  SLE history: -In November 2017 for the following manifestations: Lymphopenia, direct Coombs positive autoimmune hemolytic anemia, myositis and arthritis, ANA titer greater than 1: 640, speckled, , Double-stranded originally elevated at 1: 5120, positive Smith and RNP antibodies, elevated ESR and CRP, low C3 and C4, negative antiphospholipid antibodies -Hospitalized December 7 through September 04, 2016 with a pericardial effusion and pericarditis -Rehospitalized December 11 September 21, 2016 with GI vasculitis, recurrence of pericardial effusion, and had a kidney biopsy at that time showing focal lupus nephritis class IIIa. -She had a flare in November 2018 with GI symptoms, Raynaud's phenomenon and a vasculitic rash was treated with IV Solu-Medrol -Flare in July 2019 with arthralgias and worsening labs treated with IV Solu-Medrol and also started on Benlysta 200 mg subcu weekly -Flare in January 2020 with polyarthritis and worsening labs. Hospitalized and treated with IV Solu-Medrol. Switch to Benlysta IV in January and February 2020 and then her family deferred any additional infusions due to COVID-19 pandemic.CellCept was discontinued and Imuran was started in March 2020 -Flare requiring hospitalization July 31 through May 03, 2019 with a pericardial  effusion requiring pericardiocentesis, pancytopenia and arthritis with worsening labs. Treated with Solu-Medrol. Imuran was discontinued and she was started on IV Cytoxan -Flare requiring hospitalization in January 8 to 14, 2022 with a pericardial effusion and tamponade requiring pericardiocentesis and drain placement, AKI and signs of active lupus nephritis, cytopenias, rash, arthritis         Filiberto Pinks, DO at 02/24/2021 11:19 AM EDT   Acute Renal Failure     Presumably due to SLE         - nephrology on case - appreciate input-Dr Lateef         - recommendation for HD         - vascular consult - dialysis acess - Dr  Trula Slade   Acute Seizure secondary to medication noncompliance in the setting chronic seizure disorder She received 200 mg loading dose of Vimpat IV once in ED. Pt reports last dose was in the AM on 11/25 so she admitted to missing her evening dose last night. - continue outpatient Vimpat 150 mg BID - EEG ordered - Ativan 2 mg PRN for seizure activity - neuro checks Q 4 h - seizure precautions - Neurology consulted, appreciate input- Dr Curly [place]  Acute on Chronic HFrEF exacerbation Elevated Troponin secondary to N-STEMI vs demand ischemia HTN HLD PMHx: HFrEF (previous LVEF 20%), Chronic pericardial effusion 9/22 ECHO: showed improved LVEF >55% & improved small pericardial effusion Troponins: 538 BNP: >4500 - continue home carvedilol, pravastatin - Echocardiogram ordered - Trend troponin > consider systemic anticoagulation if needed - f/u BNP, lipid panel - Continuous cardiac monitoring  - Daily weights to assess volume status - Diurese BP & renal function tolerate - rest of antihypertensive regimen on hold consider restarting as patient stabilizes: nifedipine & chlorthalidone - hydralazine PRN IV ordered if SBP > 160 -  cardiology consult-hx of pericardia effusion and pericardiocentesis- Dr Quentin Ore appreciate input  Fall secondary to seizure activity - f/u [facility] & C-spine - falls precautions  Chronic Anemia in the setting of Chronic Disease PMHx: Hemolytic Anemia Hgb: 7.4 which appears to be around baseline - Monitor for s/s of bleeding - Daily CBC - Transfuse for Hgb <7  Best Practice (right click and "Reselect all SmartList Selections" daily)  Diet/type: NPO w/ oral meds DVT prophylaxis: prophylactic heparin  GI prophylaxis: PPI Lines: N/A Foley:  N/A Code Status:  full code Last date of multidisciplinary goals of care discussion [08/21/21]  Labs   CBC: Recent Labs  Lab 08/21/21 0100 08/21/21 0531 08/21/21 0957 08/22/21 0409  WBC 20.1* 13.6*  --  32.8*   NEUTROABS 18.5*  --   --   --   HGB 7.4* 6.3* 6.4* 5.9*  HCT 23.4* 20.1* 19.9* 18.2*  MCV 87.6 87.0  --  86.7  PLT 188 128*  --  120*     Basic Metabolic Panel: Recent Labs  Lab 08/21/21 0100 08/21/21 0531 08/22/21 0409  NA 137 139 137  K 5.1 5.1 6.1*  CL 110 112* 106  CO2 17* 19* 20*  GLUCOSE 80 157* 133*  BUN 86* 88* 97*  CREATININE 7.89* 7.90* 8.78*  CALCIUM 7.7* 7.3* 7.7*  MG  --  1.7 2.9*  PHOS  --  4.4 5.8*    GFR: Estimated Creatinine Clearance: 9.6 mL/min (A) (by C-G formula based on SCr of 8.78 mg/dL (H)). Recent Labs  Lab 08/21/21 0100 08/21/21 0102 08/21/21 0531 08/22/21 0409  PROCALCITON 32.25  --  >150.00 >150.00  WBC 20.1*  --  13.6*  32.8*  LATICACIDVEN  --  2.5* 1.7  --      Liver Function Tests: Recent Labs  Lab 08/21/21 0100  AST 20  ALT 15  ALKPHOS 43  BILITOT 0.6  PROT 6.2*  ALBUMIN 3.2*    No results for input(s): LIPASE, AMYLASE in the last 168 hours. No results for input(s): AMMONIA in the last 168 hours.  ABG    Component Value Date/Time   PHART 7.41 08/21/2021 0829   PCO2ART 28 (L) 08/21/2021 0829   PO2ART 137 (H) 08/21/2021 0829   HCO3 17.7 (L) 08/21/2021 0829   ACIDBASEDEF 6.1 (H) 08/21/2021 0829   O2SAT 99.1 08/21/2021 0829      Coagulation Profile: Recent Labs  Lab 08/21/21 0101  INR 1.0     Cardiac Enzymes: Recent Labs  Lab 08/21/21 0957  CKTOTAL 75    HbA1C: No results found for: HGBA1C  CBG: Recent Labs  Lab 08/21/21 0510 08/21/21 0542 08/21/21 0822 08/21/21 1625 08/22/21 0622  GLUCAP 44* 115* 75 102* 118*     Review of Systems: Positives in BOLD  Gen: Denies fever, chills, weight change, fatigue, night sweats HEENT: Denies blurred vision, double vision, hearing loss, tinnitus, sinus congestion, rhinorrhea, sore throat, neck stiffness, dysphagia PULM: Denies shortness of breath, cough, sputum production, hemoptysis, wheezing CV: Denies chest pain, edema, orthopnea, paroxysmal  nocturnal dyspnea, palpitations GI: Denies abdominal pain, nausea, vomiting, diarrhea, hematochezia, melena, constipation, change in bowel habits GU: Denies dysuria, hematuria, polyuria, oliguria, urethral discharge Endocrine: Denies hot or cold intolerance, polyuria, polyphagia or appetite change Derm: Denies rash, dry skin, scaling or peeling skin change Heme: Denies easy bruising, bleeding, bleeding gums Neuro: Denies headache, numbness, weakness, slurred speech, loss of memory or consciousness  Past Medical History:  She,  has a past medical history of Hypertension, Lupus ([place]), Lupus nephritis ([place]), and Seizure ([place]).   Surgical History:  History reviewed. No pertinent surgical history.   Social History:   reports that she has never smoked. She has never used smokeless tobacco. She reports that she does not drink alcohol and does not use drugs.   Family History:  Her family history includes Hyperlipidemia in her maternal grandmother.   Allergies Allergies  Allergen Reactions   Amlodipine Swelling     Home Medications  Prior to Admission medications   Medication Sig Start Date End Date Taking? Authorizing Provider  Ampicillin-Sulbactam 3 g in sodium chloride 0.9 % 100 mL Inject 3 g into the vein every 6 (six) hours. 12/10/20   Flora Lipps, MD  cloNIDine (CATAPRES - DOSED IN MG/24 HR) 0.3 mg/24hr patch Place 1 patch (0.3 mg total) onto the skin once a week. 12/10/20   Flora Lipps, MD     Critical care provider statement:   Total critical care time:  170 minutes   Performed by: Lanney Gins MD   Critical care time was exclusive of separately billable procedures and treating other patients.   Critical care was necessary to treat or prevent imminent or life-threatening deterioration.   Critical care was time spent personally by me on the following activities: development of treatment plan with patient and/or surrogate as well as nursing, discussions with consultants,  evaluation of patient's response to treatment, examination of patient, obtaining history from patient or surrogate, ordering and performing treatments and interventions, ordering and review of laboratory studies, ordering and review of radiographic studies, pulse oximetry and re-evaluation of patient's condition.    Ottie Glazier, M.D.  Pulmonary & Critical Care Medicine

## 2021-08-22 NOTE — Progress Notes (Signed)
CRITICAL CARE  Reviewed case with Nephrology [facility] Dr Matt Holmes.  Patient will be accepted to transfer to [facility] unit. [facility] transport line will call for update in AM.  Current plan remains to place HD access in am and perform HD.     Ottie Glazier, M.D.  Pulmonary & [place]

## 2021-08-22 NOTE — Progress Notes (Signed)
[facility]  ROUNDING NOTE   Subjective:  Patient seen and evaluated at bedside. Patient's mother at bedside today. Patient appears to be sleeping at the moment and is arousable but would not speak regarding her condition. Specifically we attempted to determine if she wanted to proceed with renal replacement therapy in the form of hemodialysis. Yesterday patient told us that she would not want to proceed with dialysis. We informed the patient's mother that the issue could potentially become life-threatening particularly as her potassium is rising. Dr. Lanney Gins has been in touch with physicians at [facility] and we also contacted [facility] nephrology today.    Objective:  Vital signs in last 24 hours:  Temp:  [98 F (36.7 C)-99.4 F (37.4 C)] 98.7 F (37.1 C) (11/27 1401) Pulse Rate:  [102-129] 105 (11/27 1401) Resp:  [18-42] 31 (11/27 1401) BP: (122-166)/(85-119) 131/100 (11/27 1401) SpO2:  [91 %-100 %] 94 % (11/27 1401) FiO2 (%):  [35 %] 35 % (11/26 1751) Weight:  [70.9 kg] 70.9 kg (11/27 0500)  Weight change: -1.675 kg Filed Weights   08/21/21 0058 08/22/21 0500  Weight: 72.6 kg 70.9 kg    Intake/Output: I/O last 3 completed shifts: In: 3309.3 [P.O.:400; I.V.:763.5; IV Piggyback:2145.8] Out: 800 [Urine:800]   Intake/Output this shift:  Total I/O In: 449.5 [I.V.:29.9; Blood:370; IV Piggyback:49.6] Out: -   Physical Exam: General: No acute distress  Head: Normocephalic, atraumatic. Moist oral mucosal membranes  Eyes: Anicteric  Neck: Supple  Lungs:  Clear to auscultation, normal effort  Heart: S1S2 no rubs  Abdomen:  Soft, nontender, bowel sounds present  Extremities: No peripheral edema.  Neurologic: Lethargic but arousable  Skin: No acute rash  Access: No hemodialysis access    Basic Metabolic Panel: Recent Labs  Lab 08/21/21 0100 08/21/21 0531 08/22/21 0409  NA 137 139 137  K 5.1 5.1 6.1*  CL 110 112* 106  CO2 17* 19* 20*  GLUCOSE 80 157* 133*   BUN 86* 88* 97*  CREATININE 7.89* 7.90* 8.78*  CALCIUM 7.7* 7.3* 7.7*  MG  --  1.7 2.9*  PHOS  --  4.4 5.8*    Liver Function Tests: Recent Labs  Lab 08/21/21 0100  AST 20  ALT 15  ALKPHOS 43  BILITOT 0.6  PROT 6.2*  ALBUMIN 3.2*   No results for input(s): LIPASE, AMYLASE in the last 168 hours. No results for input(s): AMMONIA in the last 168 hours.  CBC: Recent Labs  Lab 08/21/21 0100 08/21/21 0531 08/21/21 0957 08/22/21 0409  WBC 20.1* 13.6*  --  32.8*  NEUTROABS 18.5*  --   --   --   HGB 7.4* 6.3* 6.4* 5.9*  HCT 23.4* 20.1* 19.9* 18.2*  MCV 87.6 87.0  --  86.7  PLT 188 128*  --  120*    Cardiac Enzymes: Recent Labs  Lab 08/21/21 0957  CKTOTAL 75    BNP: Invalid input(s): POCBNP  CBG: Recent Labs  Lab 08/21/21 0822 08/21/21 1625 08/22/21 0622 08/22/21 1144 08/22/21 1509  GLUCAP 75 102* 118* 115* 111*    Microbiology: Results for orders placed or performed during the hospital encounter of 08/21/21  Resp Panel by RT-PCR (Flu A&B, Covid) Nasopharyngeal Swab     Status: None   Collection Time: 08/21/21  1:00 AM   Specimen: Nasopharyngeal Swab; Nasopharyngeal(NP) swabs in vial transport medium  Result Value Ref Range Status   SARS Coronavirus 2 by RT PCR NEGATIVE NEGATIVE Final    Comment: (NOTE) SARS-CoV-2 target nucleic acids  are NOT DETECTED.  The SARS-CoV-2 RNA is generally detectable in upper respiratory specimens during the acute phase of infection. The lowest concentration of SARS-CoV-2 viral copies this assay can detect is 138 copies/mL. A negative result does not preclude SARS-Cov-2 infection and should not be used as the sole basis for treatment or other patient management decisions. A negative result may occur with  improper specimen collection/handling, submission of specimen other than nasopharyngeal swab, presence of viral mutation(s) within the areas targeted by this assay, and inadequate number of viral copies(<138 copies/mL).  A negative result must be combined with clinical observations, patient history, and epidemiological information. The expected result is Negative.  Fact Sheet for Patients:  EntrepreneurPulse.com.au  Fact Sheet for Healthcare Providers:  IncredibleEmployment.be  This test is no t yet approved or cleared by the [place] FDA and  has been authorized for detection and/or diagnosis of SARS-CoV-2 by FDA under an Emergency Use Authorization (EUA). This EUA will remain  in effect (meaning this test can be used) for the duration of the COVID-19 declaration under Section 564(b)(1) of the Act, 21 U.S.C.section 360bbb-3(b)(1), unless the authorization is terminated  or revoked sooner.       Influenza A by PCR NEGATIVE NEGATIVE Final   Influenza B by PCR NEGATIVE NEGATIVE Final    Comment: (NOTE) The Xpert Xpress SARS-CoV-2/FLU/RSV plus assay is intended as an aid in the diagnosis of influenza from Nasopharyngeal swab specimens and should not be used as a sole basis for treatment. Nasal washings and aspirates are unacceptable for Xpert Xpress SARS-CoV-2/FLU/RSV testing.  Fact Sheet for Patients: EntrepreneurPulse.com.au  Fact Sheet for Healthcare Providers: IncredibleEmployment.be  This test is not yet approved or cleared by the [place] FDA and has been authorized for detection and/or diagnosis of SARS-CoV-2 by FDA under an Emergency Use Authorization (EUA). This EUA will remain in effect (meaning this test can be used) for the duration of the COVID-19 declaration under Section 564(b)(1) of the Act, 21 U.S.C. section 360bbb-3(b)(1), unless the authorization is terminated or revoked.  Performed at [facility], [address] [place], [place] [address]   Blood Culture (routine x 2)     Status: None (Preliminary result)   Collection Time: 08/21/21  1:04 AM   Specimen: BLOOD  Result Value Ref  Range Status   Specimen Description BLOOD RIGHT FOREARM  Final   Special Requests   Final    BOTTLES DRAWN AEROBIC AND ANAEROBIC Blood Culture results may not be optimal due to an inadequate volume of blood received in culture bottles   Culture   Final    NO GROWTH 1 DAY Performed at [facility], [address] [place], [place] [address]    Report Status PENDING  Incomplete  Blood Culture (routine x 2)     Status: None (Preliminary result)   Collection Time: 08/21/21  1:05 AM   Specimen: BLOOD  Result Value Ref Range Status   Specimen Description BLOOD RIGHT HAND  Final   Special Requests   Final    BOTTLES DRAWN AEROBIC AND ANAEROBIC Blood Culture adequate volume   Culture   Final    NO GROWTH 1 DAY Performed at [facility], [address], [place], [place] [address]    Report Status PENDING  Incomplete  Urine Culture     Status: Abnormal   Collection Time: 08/21/21  2:05 AM   Specimen: In/Out Cath Urine  Result Value Ref Range Status   Specimen Description  Final    IN/OUT CATH URINE Performed at [facility], [address] [place], [place] [address]    Special Requests   Final    NONE Performed at [facility], [address] [place], [place] [address]    Culture MULTIPLE SPECIES PRESENT, SUGGEST RECOLLECTION (A)  Final   Report Status 08/22/2021 FINAL  Final  MRSA Next Gen by PCR, Nasal     Status: None   Collection Time: 08/21/21  4:50 AM   Specimen: Nasal Mucosa; Nasal Swab  Result Value Ref Range Status   MRSA by PCR Next Gen NOT DETECTED NOT DETECTED Final    Comment: (NOTE) The GeneXpert MRSA Assay (FDA approved for NASAL specimens only), is one component of a comprehensive MRSA colonization surveillance program. It is not intended to diagnose MRSA infection nor to guide or monitor treatment for MRSA infections. Test performance is not FDA approved in patients less than 99 years old. Performed at [facility], [address] [place], [place] [address]   Respiratory (~20 pathogens) panel by PCR     Status: None   Collection Time: 08/21/21  7:06 AM   Specimen: Nasopharyngeal Swab; Respiratory  Result Value Ref Range Status   Adenovirus NOT DETECTED NOT DETECTED Final   Coronavirus 229E NOT DETECTED NOT DETECTED Final    Comment: (NOTE) The Coronavirus on the Respiratory Panel, DOES NOT test for the novel  Coronavirus (2019 nCoV)    Coronavirus HKU1 NOT DETECTED NOT DETECTED Final   Coronavirus NL63 NOT DETECTED NOT DETECTED Final   Coronavirus OC43 NOT DETECTED NOT DETECTED Final   Metapneumovirus NOT DETECTED NOT DETECTED Final   Rhinovirus / Enterovirus NOT DETECTED NOT DETECTED Final   Influenza A NOT DETECTED NOT DETECTED Final   Influenza B NOT DETECTED NOT DETECTED Final   Parainfluenza Virus 1 NOT DETECTED NOT DETECTED Final   Parainfluenza Virus 2 NOT DETECTED NOT DETECTED Final   Parainfluenza Virus 3 NOT DETECTED NOT DETECTED Final   Parainfluenza Virus 4 NOT DETECTED NOT DETECTED Final   Respiratory Syncytial Virus NOT DETECTED NOT DETECTED Final   Bordetella pertussis NOT DETECTED NOT DETECTED Final   Bordetella Parapertussis NOT DETECTED NOT DETECTED Final   Chlamydophila pneumoniae NOT DETECTED NOT DETECTED Final   Mycoplasma pneumoniae NOT DETECTED NOT DETECTED Final    Comment: Performed at [facility], [place]. [address] [place], [place] [address]    Coagulation Studies: Recent Labs    08/21/21 0101  LABPROT 13.6  INR 1.0    Urinalysis: Recent Labs    08/21/21 0205  COLORURINE YELLOW  LABSPEC 1.020  PHURINE 8.5*  GLUCOSEU 100*  HGBUR SMALL*  BILIRUBINUR NEGATIVE  KETONESUR NEGATIVE  PROTEINUR >300*  NITRITE NEGATIVE  LEUKOCYTESUR TRACE*      Imaging: CT HEAD WO CONTRAST (5MM)  Result Date: 08/21/2021 CLINICAL DATA:  Status post seizure. EXAM: CT HEAD WITHOUT CONTRAST TECHNIQUE: Contiguous axial images were obtained from the base of the  skull through the vertex without intravenous contrast. COMPARISON:  None. FINDINGS: Brain: No evidence of acute infarction, hemorrhage, hydrocephalus, extra-axial collection or mass lesion/mass effect. Vascular: No hyperdense vessel or unexpected calcification. Skull: Normal. Negative for fracture or focal lesion. Sinuses/Orbits: No acute finding. Other: None. IMPRESSION: No acute intracranial pathology. Electronically Signed   By: Virgina Norfolk M.D.   On: 08/21/2021 03:49   CT Cervical Spine Wo Contrast  Result Date: 08/21/2021 CLINICAL DATA:  Status post seizure. EXAM: CT CERVICAL SPINE WITHOUT  CONTRAST TECHNIQUE: Multidetector CT imaging of the cervical spine was performed without intravenous contrast. Multiplanar CT image reconstructions were also generated. COMPARISON:  None. FINDINGS: Alignment: Normal. Skull base and vertebrae: No acute fracture. No primary bone lesion or focal pathologic process. Soft tissues and spinal canal: No prevertebral fluid or swelling. No visible canal hematoma. Disc levels: Normal multilevel endplates are seen with normal multilevel intervertebral disc spaces. Normal bilateral multilevel facet joints are noted. Upper chest: Negative. Other: None. IMPRESSION: No acute fracture or subluxation of the cervical spine. Electronically Signed   By: Virgina Norfolk M.D.   On: 08/21/2021 03:51   DG Chest Portable 1 View  Result Date: 08/21/2021 CLINICAL DATA:  Initial evaluation for acute fever, seizure. EXAM: PORTABLE CHEST 1 VIEW COMPARISON:  Prior radiograph from 12/09/2020 FINDINGS: Marked cardiomegaly, stable. Mediastinal silhouette within normal limits. Lungs normally inflated. Prominent patchy airspace opacity seen throughout the right upper and lower lobes, concerning for multifocal pneumonia. Possible patchy involvement of the left lung base as well. Underlying mild perihilar vascular congestion without overt pulmonary edema. Probable trace bilateral pleural  effusions. No pneumothorax. No acute osseous finding. IMPRESSION: 1. Extensive patchy airspace disease throughout the right upper and lower lobes, concerning for multifocal pneumonia. 2. Underlying cardiomegaly with perihilar vascular congestion without overt pulmonary edema. 3. Probable trace bilateral pleural effusions. Electronically Signed   By: Jeannine Boga M.D.   On: 08/21/2021 01:18   ECHOCARDIOGRAM COMPLETE  Result Date: 08/22/2021    ECHOCARDIOGRAM REPORT   Patient Name:   [facility] Date of Exam: 08/22/2021 Medical Rec #:  [id number]    Height:       66.0 in Accession #:    [id number]   Weight:       156.3 lb Date of Birth:  2001-08-20    BSA:          1.801 m Patient Age:    20 years     BP:           149/87 mmHg Patient Gender: F            HR:           106 bpm. Exam Location:  [facility] Procedure: 2D Echo, Color Doppler and Cardiac Doppler Indications:     I42.0 Dilated cardiomyopathy  History:         Patient has prior history of Echocardiogram examinations.                  Lupus; Risk Factors:Hypertension.  Sonographer:     Charmayne Sheer Referring Phys:  [id number] BRITTON L RUST-CHESTER Diagnosing Phys: Ida Rogue MD  Sonographer Comments: Suboptimal apical window and suboptimal subcostal window. Image acquisition challenging due to uncooperative patient. Global longitudinal strain was attempted. IMPRESSIONS  1. Moderate pericardial effusion. The pericardial effusion is posterior and lateral to the left ventricle. There is no evidence of cardiac tamponade.  2. Left ventricular ejection fraction, by estimation, is 30 to 35%. The left ventricle has moderately decreased function. The left ventricle demonstrates global hypokinesis. There is mild left ventricular hypertrophy. Left ventricular diastolic parameters are consistent with Grade II diastolic dysfunction (pseudonormalization). The average left ventricular global longitudinal strain is -5.1 %. The global longitudinal strain is abnormal.   3. Right ventricular systolic function is normal. The right ventricular size is normal. There is normal pulmonary artery systolic pressure. The estimated right ventricular systolic pressure is 62.2 mmHg.  4. Left atrial size was moderately dilated.  5. Right atrial size was  mildly dilated.  6. The mitral valve is normal in structure. Mild mitral valve regurgitation. No evidence of mitral stenosis.  7. Tricuspid valve regurgitation is moderate.  8. The inferior vena cava is normal in size with greater than 50% respiratory variability, suggesting right atrial pressure of 3 mmHg. FINDINGS  Left Ventricle: Left ventricular ejection fraction, by estimation, is 30 to 35%. The left ventricle has moderately decreased function. The left ventricle demonstrates global hypokinesis. The average left ventricular global longitudinal strain is -5.1 %.  The global longitudinal strain is abnormal. The left ventricular internal cavity size was normal in size. There is mild left ventricular hypertrophy. Left ventricular diastolic parameters are consistent with Grade II diastolic dysfunction (pseudonormalization). Right Ventricle: The right ventricular size is normal. No increase in right ventricular wall thickness. Right ventricular systolic function is normal. There is normal pulmonary artery systolic pressure. The tricuspid regurgitant velocity is 2.25 m/s, and  with an assumed right atrial pressure of 5 mmHg, the estimated right ventricular systolic pressure is 86.7 mmHg. Left Atrium: Left atrial size was moderately dilated. Right Atrium: Right atrial size was mildly dilated. Pericardium: A moderately sized pericardial effusion is present. The pericardial effusion is posterior and lateral to the left ventricle. There is no evidence of cardiac tamponade. Mitral Valve: The mitral valve is normal in structure. There is mild thickening of the mitral valve leaflet(s). There is mild calcification of the mitral valve leaflet(s). Mild  mitral valve regurgitation. No evidence of mitral valve stenosis. MV peak gradient, 5.0 mmHg. The mean mitral valve gradient is 3.0 mmHg. Tricuspid Valve: The tricuspid valve is normal in structure. Tricuspid valve regurgitation is moderate . No evidence of tricuspid stenosis. Aortic Valve: The aortic valve is normal in structure. Aortic valve regurgitation is not visualized. No aortic stenosis is present. Aortic valve mean gradient measures 2.0 mmHg. Aortic valve peak gradient measures 2.7 mmHg. Aortic valve area, by VTI measures 2.73 cm. Pulmonic Valve: The pulmonic valve was normal in structure. Pulmonic valve regurgitation is mild. No evidence of pulmonic stenosis. Aorta: The aortic root is normal in size and structure. Venous: The inferior vena cava is normal in size with greater than 50% respiratory variability, suggesting right atrial pressure of 3 mmHg. IAS/Shunts: No atrial level shunt detected by color flow Doppler.  LEFT VENTRICLE PLAX 2D LVIDd:         5.40 cm   Diastology LVIDs:         4.40 cm   LV e' medial:    7.51 cm/s LV PW:         1.30 cm   LV E/e' medial:  12.9 LV IVS:        1.10 cm   LV e' lateral:   8.81 cm/s LVOT diam:     2.10 cm   LV E/e' lateral: 11.0 LV SV:         33 LV SV Index:   18        2D Longitudinal Strain LVOT Area:     3.46 cm  2D Strain GLS Avg:     -5.1 %  RIGHT VENTRICLE RV Basal diam:  3.70 cm RV S prime:     7.83 cm/s LEFT ATRIUM             Index        RIGHT ATRIUM           Index LA diam:        5.00 cm 2.78 cm/m  RA Area:     20.60 cm LA Vol (A2C):   30.6 ml 16.99 ml/m  RA Volume:   55.70 ml  30.93 ml/m LA Vol (A4C):   52.4 ml 29.10 ml/m LA Biplane Vol: 44.1 ml 24.49 ml/m  AORTIC VALVE                    PULMONIC VALVE AV Area (Vmax):    3.01 cm     PV Vmax:       0.70 m/s AV Area (Vmean):   2.97 cm     PV Vmean:      47.100 cm/s AV Area (VTI):     2.73 cm     PV VTI:        0.134 m AV Vmax:           81.90 cm/s   PV Peak grad:  2.0 mmHg AV Vmean:           59.800 cm/s  PV Mean grad:  1.0 mmHg AV VTI:            0.120 m AV Peak Grad:      2.7 mmHg AV Mean Grad:      2.0 mmHg LVOT Vmax:         71.10 cm/s LVOT Vmean:        51.200 cm/s LVOT VTI:          0.095 m LVOT/AV VTI ratio: 0.79  AORTA Ao Root diam: 3.20 cm MITRAL VALVE               TRICUSPID VALVE MV Area (PHT): 6.07 cm    TR Peak grad:   20.2 mmHg MV Area VTI:   2.75 cm    TR Vmax:        225.00 cm/s MV Peak grad:  5.0 mmHg MV Mean grad:  3.0 mmHg    SHUNTS MV Vmax:       1.12 m/s    Systemic VTI:  0.09 m MV Vmean:      70.6 cm/s   Systemic Diam: 2.10 cm MV Decel Time: 125 msec MV E velocity: 96.65 cm/s Ida Rogue MD Electronically signed by Ida Rogue MD Signature Date/Time: 08/22/2021/2:05:10 PM    Final      Medications:    azithromycin 500 mg (08/22/21 7628)   cefTRIAXone (ROCEPHIN)  IV Stopped (08/21/21 1103)   dextrose 30 mL/hr at 08/22/21 0800    carvedilol  25 mg Oral Daily   Chlorhexidine Gluconate Cloth  6 each Topical Q0600   cloNIDine  0.1 mg Oral QID   heparin  5,000 Units Subcutaneous Q8H   hydroxychloroquine  400 mg Oral Daily   lacosamide  150 mg Oral BID   methylPREDNISolone (SOLU-MEDROL) injection  80 mg Intravenous Q12H   NIFEdipine  90 mg Oral Daily   pantoprazole (PROTONIX) IV  40 mg Intravenous Q24H   patiromer  8.4 g Oral Daily   pravastatin  20 mg Oral Daily   acetaminophen, docusate sodium, levalbuterol, LORazepam, oxyCODONE, polyethylene glycol  Assessment/ Plan:  20 y.o. 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     1.  Acute kidney injury/chronic kidney disease stage IV/class IV  lupus  nephritis/proteinuria.  Patient has been actively followed by [facility] nephrology.  In the past has been treated with Cytoxan.  Given her young age it was felt that further Cytoxan treatment should be avoided.  Acute kidney injury now could be secondary to progressive lupus nephritis. -Renal function worsening.  BUN up to 97 with a creatinine of 8.7 and EGFR down to 8.  Hyperkalemia also developing.  At this point in time we recommend renal placement therapy.  We also discussed care of the patient with the patient's nephrologist at [facility], Dr. Domenica Fail.  They agreed with our plan to initiate dialysis.  In addition we should give some consideration to renal biopsy along with empiric immunosuppression.  Pulmonary/critical care has discussed care of the patient with [facility] rheumatology.  They have recommended empiric steroids at this point in time which we agree with.  We would like to hold off on biopsy until uremia is further addressed to decrease risk of bleeding.  Patient did not interact with [place] today therefore it was difficult for [place] to ascertain as to whether she wants to proceed with renal placement therapy.  Patient's mother was going to discuss this further with her and then contact the care team.  We discussed risks, benefits, and alternatives with the patient's mother.  2.  Hyperkalemia.  Serum potassium 6.1.  Patient started on Veltassa 8.4 g daily.  3.  Anemia of chronic kidney disease/hemolytic anemia in the setting of lupus nephritis.  Hemoglobin down to 5.9.  Blood transfusion as per pulmonary/critical care.   LOS: 1 Quincey Nored 11/27/20223:19 PM

## 2021-08-22 NOTE — Progress Notes (Addendum)
Pt given PRN hydralize when necessary, assisted to [facility], pt re-educated on using call bell to  get to [facility]. Labs actively being replaced, hemoglobin to be treated with family approval. No significant events during shift.  Mother at bedside during shift change, mother given update. Patient in bed denies pain or SOB. Handoff report givent to [facility]

## 2021-08-22 NOTE — Progress Notes (Signed)
*  PRELIMINARY RESULTS* Echocardiogram 2D Echocardiogram has been performed.  Melinda Steele 08/22/2021, 1:32 PM

## 2021-08-22 NOTE — Progress Notes (Signed)
Neurology Progress Note  Patient ID: Melinda Steele is a 20 y.o. with PMHx of  has a past medical history of Hypertension, Lupus ([place]), Lupus nephritis ([place]), and Seizure ([place]).  Initially consulted for: Seizure work-up and management   Major interval events/Subjective: -Now on room air -No further seizure activity -Cardiology evaluation today notable for worsened EF (25 to 30% down from 55% reported on September 2022 echo) and moderate to large pericardial effusion without evidence of cardiac tamponade on bedside echocardiogram -Receiving blood for severe anemia   Current Facility-Administered Medications:    acetaminophen (TYLENOL) tablet 650 mg, 650 mg, Oral, Q6H PRN, Rust-Chester, Britton L, NP, 650 mg at 08/22/21 0944   azithromycin (ZITHROMAX) 500 mg in sodium chloride 0.9 % 250 mL IVPB, 500 mg, Intravenous, Q24H, Oswald Hillock, RPH, Last Rate: 250 mL/hr at 08/22/21 0937, 500 mg at 08/22/21 0937   carvedilol (COREG) tablet 25 mg, 25 mg, Oral, Daily, Rust-Chester, Britton L, NP, 25 mg at 08/22/21 0942   cefTRIAXone (ROCEPHIN) 2 g in sodium chloride 0.9 % 100 mL IVPB, 2 g, Intravenous, Q24H, Rust-Chester, Huel Cote, NP, Stopped at 08/21/21 1103   Chlorhexidine Gluconate Cloth 2 % PADS 6 each, 6 each, Topical, Q0600, Ottie Glazier, MD, 6 each at 08/21/21 0414   cloNIDine (CATAPRES) tablet 0.1 mg, 0.1 mg, Oral, QID, Lanney Gins, Fuad, MD, 0.1 mg at 08/22/21 1121   dextrose 10 % infusion, , Intravenous, Continuous, Rust-Chester, Britton L, NP, Last Rate: 30 mL/hr at 08/22/21 0800, Infusion Verify at 08/22/21 0800   docusate sodium (COLACE) capsule 100 mg, 100 mg, Oral, BID PRN, Rust-Chester, Toribio Harbour L, NP, 100 mg at 08/22/21 1002   heparin injection 5,000 Units, 5,000 Units, Subcutaneous, Q8H, Rust-Chester, Britton L, NP, 5,000 Units at 08/22/21 0514   hydroxychloroquine (PLAQUENIL) tablet 400 mg, 400 mg, Oral, Daily, Rust-Chester, Toribio Harbour L, NP, 400 mg at 08/22/21 0086   lacosamide (VIMPAT)  tablet 150 mg, 150 mg, Oral, BID, Rust-Chester, Toribio Harbour L, NP, 150 mg at 08/22/21 0941   levalbuterol (XOPENEX) nebulizer solution 1.25 mg, 1.25 mg, Nebulization, Q6H PRN, Rust-Chester, Toribio Harbour L, NP   LORazepam (ATIVAN) injection 2 mg, 2 mg, Intravenous, Q1H PRN, Rust-Chester, Toribio Harbour L, NP   methylPREDNISolone sodium succinate (SOLU-MEDROL) 40 mg/mL injection 40 mg, 40 mg, Intravenous, Daily, Aleskerov, Fuad, MD, 40 mg at 08/22/21 0938   NIFEdipine (PROCARDIA-XL/NIFEDICAL-XL) 24 hr tablet 90 mg, 90 mg, Oral, Daily, Dorothe Pea, RPH, 90 mg at 08/22/21 1121   oxyCODONE (Oxy IR/ROXICODONE) immediate release tablet 5-10 mg, 5-10 mg, Oral, Q4H PRN, Rust-Chester, Britton L, NP, 10 mg at 08/21/21 2159   pantoprazole (PROTONIX) injection 40 mg, 40 mg, Intravenous, Q24H, Rust-Chester, Britton L, NP, 40 mg at 08/22/21 7619   patiromer (VELTASSA) packet 8.4 g, 8.4 g, Oral, Daily, Rust-Chester, Britton L, NP   polyethylene glycol (MIRALAX / GLYCOLAX) packet 17 g, 17 g, Oral, Daily PRN, Rust-Chester, Britton L, NP, 17 g at 08/22/21 1344   pravastatin (PRAVACHOL) tablet 20 mg, 20 mg, Oral, Daily, Rust-Chester, Britton L, NP, 20 mg at 08/22/21 0942   Exam: Vitals:   08/22/21 1337 08/22/21 1345  BP: (!) 122/91   Pulse: (!) 102 (!) 104  Resp: (!) 21 (!) 27  Temp: 98.8 F (37.1 C) 98.8 F (37.1 C)  SpO2: 97% 99%   Gen: In bed, comfortable  Resp: non-labored breathing, no grossly audible wheezing Cardiac: Perfusing extremities well  Abd: soft, nt  Neuro: MS: Awake, alert, interactive but extremely flat affect and declines  full neurological examination CN: EOMI, face symmetric, tongue midline Motor: Moving bilateral upper extremities spontaneously and equally  Pertinent data:  Basic Metabolic Panel: Recent Labs  Lab 08/21/21 0100 08/21/21 0531 08/22/21 0409  NA 137 139 137  K 5.1 5.1 6.1*  CL 110 112* 106  CO2 17* 19* 20*  GLUCOSE 80 157* 133*  BUN 86* 88* 97*  CREATININE 7.89*  7.90* 8.78*  CALCIUM 7.7* 7.3* 7.7*  MG  --  1.7 2.9*  PHOS  --  4.4 5.8*    CBC: Recent Labs  Lab 08/21/21 0100 08/21/21 0531 08/21/21 0957 08/22/21 0409  WBC 20.1* 13.6*  --  32.8*  NEUTROABS 18.5*  --   --   --   HGB 7.4* 6.3* 6.4* 5.9*  HCT 23.4* 20.1* 19.9* 18.2*  MCV 87.6 87.0  --  86.7  PLT 188 128*  --  120*    Coagulation Studies: Recent Labs    08/21/21 0101  LABPROT 13.6  INR 1.0      Impression: Neurologically, the patient remained stable though from a medical perspective she has many challenges currently.  Given her improvement in mental status and lack of further seizure activity, I do not feel that MRI brain would change management at this time, or that she needs any further neurological work-up.  While she appears to be improving from a respiratory standpoint, she does have significant cardiac and renal related challenges for which I appreciate the involvement of cardiology, nephrology, and critical care team  Recommendations: -Continue home Vimpat 150 mg twice daily -Continue seizure precautions -Continue outpatient neurology follow-up on discharge -No further inpatient neurological work-up at this time unless new neurological questions or concerns arise -Please do reach out to neurology if new questions or concerns arise, will sign off at this time  [place] [phone]   Greater than 25 minutes were spent in the care of this patient today, greater than 50% of bedside

## 2021-08-22 NOTE — Progress Notes (Addendum)
Abdominal Pain & Distention Called bedside by care RN as patient is c/o nausea and had 2 loose stools in the setting of generalized abdominal pain & distention. Patient reports 4, non-bloody bilious, vomiting episodes today, being unable to tolerate anything PO. She reports 9/10 intermittent generalized discomfort, reporting she feels distended. Symptoms briefly alleviated by vomiting/using the toilet and worse when laying down.  Also, of note, patient had a cup full of medications bedside from earlier in the day that she did not take because of nausea.  Exam: abdomen distended, BS x 4, no pain to palpation On admission pt septic due to suspected Pna, worsening leukocytosis: 13.6 > 32.8, not febrile today but did receive tylenol for PRBC pre-medication. Patient currently being treated with ceftriaxone & azithromycin IV. Patient on Benlysta outpatient weekly for SLE.  - STAT CT abdomen/pelvis wo contrast ordered  - clonidine PO switched to transdermal patch - lopressor  & hydralazine IV PRN's added if BP > 160/110 - Zofran IV Q 6 PRN added - GI stool panel ordered with enteric precautions - adding flagyl IV  Plan of care discussed with the patient & mother bedside as well as care RN   Additional CC time: 30 minutes  Venetia Night, AGACNP-BC Acute Care Nurse Practitioner [place]   [phone] / [phone] Please see Amion for pager details.

## 2021-08-22 NOTE — Progress Notes (Signed)
  Spoke with Pam in Radiology regarding images being shared with [facility] doctor per Dr. Teodoro Kil request.

## 2021-08-22 NOTE — Progress Notes (Signed)
CRITICAL CARE   Was able to speak with rheumatologist Dr Elwyn Reach with [facility] who was alble to give recommendations to use solumuderol for now at 1mg /kg.  Additionally he agress patient should be transferred to [facility]. I was able to speak to admitting hospitalist Dr Lenox Ponds who is asking for nephrology [facility] opinion regarding triage of patient. [facility] is considering transfer of patient to [facility] but at this time we are to continue managing with HD as planned.     Ottie Glazier, M.D.  Pulmonary & [place]

## 2021-08-22 NOTE — Consult Note (Signed)
Cardiology Consultation:   Patient ID: Melinda Steele MRN: [id number]; DOB: [birth year]  Admit date: 08/21/2021 Date of Consult: 08/22/2021  PCP:  [place], [place]      Patient Profile:   Melinda Steele is a 20 y.o. female with a hx of seizures, lupus, chronic systolic heart failure, glomerulonephritis, CKD 3B, hemolytic anemia and chronic pericardial effusions who is being seen 08/22/2021 for the evaluation of pericardial effusion at the request of Dr. Lanney Gins.  History of Present Illness:   Melinda Steele was admitted November 26 after a seizure at home.  According to the patient and her mother, she had felt poorly for at least 2 to 3 days prior to admission.  Upon arrival she was requiring high flow nasal cannula.  She is being treated for aspiration pneumonia and acute renal failure in the setting of her lupus diagnosis.  At the time of my evaluation she was off high flow nasal cannula.  Her mother provided much of the history.  She is severely anemic and is being transfused during my interview.  The patient tells me that she has had multiple pericardiocentesis in the past.  These have been done at [facility].  I am asked to weigh in about her history of cardiac disease and the possibility of a pericardial effusion.   Past Medical History:  Diagnosis Date   Hypertension    Lupus ([place])    Lupus nephritis ([place])    Seizure ([place])     History reviewed. No pertinent surgical history.     Inpatient Medications: Scheduled Meds:  carvedilol  25 mg Oral Daily   Chlorhexidine Gluconate Cloth  6 each Topical Q0600   cloNIDine  0.1 mg Oral QID   heparin  5,000 Units Subcutaneous Q8H   hydroxychloroquine  400 mg Oral Daily   lacosamide  150 mg Oral BID   methylPREDNISolone (SOLU-MEDROL) injection  40 mg Intravenous Daily   NIFEdipine  90 mg Oral Daily   pantoprazole (PROTONIX) IV  40 mg Intravenous Q24H   patiromer  8.4 g Oral Daily   pravastatin  20 mg Oral Daily    Continuous Infusions:  azithromycin 500 mg (08/22/21 0937)   cefTRIAXone (ROCEPHIN)  IV Stopped (08/21/21 1103)   dextrose 30 mL/hr at 08/22/21 0800   PRN Meds: acetaminophen, docusate sodium, levalbuterol, LORazepam, oxyCODONE, polyethylene glycol  Allergies:    Allergies  Allergen Reactions   Amlodipine Swelling    Social History:   Social History   Socioeconomic History   Marital status: Single    Spouse name: Not on file   Number of children: Not on file   Years of education: Not on file   Highest education level: Not on file  Occupational History   Not on file  Tobacco Use   Smoking status: Never   Smokeless tobacco: Never  Substance and Sexual Activity   Alcohol use: Never   Drug use: Never   Sexual activity: Yes    Birth control/protection: OCP  Other Topics Concern   Not on file  Social History Narrative   Not on file   Social Determinants of Health   Financial Resource Strain: Not on file  Food Insecurity: Not on file  Transportation Needs: Not on file  Physical Activity: Not on file  Stress: Not on file  Social Connections: Not on file  Intimate Partner Violence: Not on file    Family History:    Family History  Problem Relation Age of Onset  Hyperlipidemia Maternal Grandmother        a. many medical problems, unknown per prior note     ROS:  Please see the history of present illness.   All other ROS reviewed and negative.     Physical Exam/Data:   Vitals:   08/22/21 1102 08/22/21 1114 08/22/21 1119 08/22/21 1200  BP: (!) 157/114  (!) 148/102 124/85  Pulse: (!) 126 (!) 121 (!) 118 (!) 108  Resp: 18 (!) 24 (!) 21 (!) 28  Temp: 99.2 F (37.3 C)  99.1 F (37.3 C)   TempSrc: Oral  Oral   SpO2: 95% 96% 96% 95%  Weight:      Height:        Intake/Output Summary (Last 24 hours) at 08/22/2021 1316 Last data filed at 08/22/2021 0800 Gross per 24 hour  Intake 870.21 ml  Output 350 ml  Net 520.21 ml   Last 3 Weights 08/22/2021  08/21/2021 12/10/2020  Weight (lbs) 156 lb 4.9 oz 160 lb 140 lb 6.9 oz  Weight (kg) 70.9 kg 72.576 kg 63.7 kg     Body mass index is 25.23 kg/m.  General:  Well nourished, well developed, in no acute distress.  In bed at 45 degrees HEENT: normal Neck: no JVD Vascular: No carotid bruits; Distal pulses 2+ bilaterally Cardiac:  normal S1, S2; tachycardic in the 120s; no murmur  Lungs:  clear to auscultation bilaterally, no wheezing, rhonchi or rales  Abd: soft, nontender, no hepatomegaly  Ext: no edema Musculoskeletal:  No deformities, BUE and BLE strength normal and equal Skin: warm and dry  Neuro:  CNs 2-12 intact, no focal abnormalities noted Psych:  Normal affect   EKG:  The EKG was personally reviewed and demonstrates: Sinus tachycardia Telemetry:  Telemetry was personally reviewed and demonstrates: Sinus tachycardia   Relevant CV Studies:  November 27 bedside echocardiogram performed by myself shows moderate to large pericardial effusion without evidence of cardiac tamponade.  LV function is moderately reduced with an ejection fraction in the 25-30% range.      June 01, 2021 echo at [facility] Summary    1. Normal left ventricular size and systolic function, ejection fraction >  55%.    2. Normal right ventricular size and systolic function.    3. No significant valvular abnormalities.    4. There is a small pericardial effusion, smaller in size compared to most  recent study from 02/02/2021.        Feb 02, 2021 echo at [facility] Summary    1. The left ventricle is mildly dilated in size with mildly increased wall  thickness.    2. The left ventricular systolic function is severely decreased, LVEF is  visually estimated at 20%.    3. Calculated LVOT SV: 52 ml.  CO: 5.9 L/min.  CI: 3.4 L/min/m2.    4. There is moderate posteriorly directed regurgitation.    5. The left atrium is moderately dilated in size.    6. The aortic valve is trileaflet with  mildly thickened leaflets with normal  excursion.    7. There is mild aortic regurgitation.    8. The pulmonary artery is moderately dilated.    9. There is mild to moderate pulmonic regurgitation.    10. The right ventricle is normal in size, with reduced systolic function.    11. There is moderate tricuspid regurgitation.    12. There is moderate-severe pulmonary hypertension, estimated pulmonary  artery systolic pressure is 69 mmHg.  13. IVC size and inspiratory change suggest mildly elevated right atrial  pressure. (5-10 mmHg).    14. There is a moderate, circumferential pericardial effusion.    15. Findings are consistent with elevated intrapericardial pressure but  there is low suspicion for acute tamponade physiology.   Laboratory Data:  High Sensitivity Troponin:   Recent Labs  Lab 08/21/21 0101 08/21/21 0531 08/21/21 0957  TROPONINIHS 538* 619* 645*     Chemistry Recent Labs  Lab 08/21/21 0100 08/21/21 0531 08/22/21 0409  NA 137 139 137  K 5.1 5.1 6.1*  CL 110 112* 106  CO2 17* 19* 20*  GLUCOSE 80 157* 133*  BUN 86* 88* 97*  CREATININE 7.89* 7.90* 8.78*  CALCIUM 7.7* 7.3* 7.7*  MG  --  1.7 2.9*  GFRNONAA 7* 7* 6*  ANIONGAP 10 8 11     Recent Labs  Lab 08/21/21 0100  PROT 6.2*  ALBUMIN 3.2*  AST 20  ALT 15  ALKPHOS 43  BILITOT 0.6   Lipids  Recent Labs  Lab 08/21/21 0531  CHOL 129  TRIG 102  HDL 42  LDLCALC 67  CHOLHDL 3.1    Hematology Recent Labs  Lab 08/21/21 0100 08/21/21 0531 08/21/21 0957 08/22/21 0409  WBC 20.1* 13.6*  --  32.8*  RBC 2.67* 2.31*  --  2.10*  HGB 7.4* 6.3* 6.4* 5.9*  HCT 23.4* 20.1* 19.9* 18.2*  MCV 87.6 87.0  --  86.7  MCH 27.7 27.3  --  28.1  MCHC 31.6 31.3  --  32.4  RDW 13.0 13.1  --  13.2  PLT 188 128*  --  120*   Thyroid No results for input(s): TSH, FREET4 in the last 168 hours.  BNP Recent Labs  Lab 08/21/21 0101 08/21/21 0531  BNP >4,500.0* >4,500.0*    DDimer No results for input(s):  DDIMER in the last 168 hours.   Radiology/Studies:  CT HEAD WO CONTRAST (5MM)  Result Date: 08/21/2021 CLINICAL DATA:  Status post seizure. EXAM: CT HEAD WITHOUT CONTRAST TECHNIQUE: Contiguous axial images were obtained from the base of the skull through the vertex without intravenous contrast. COMPARISON:  None. FINDINGS: Brain: No evidence of acute infarction, hemorrhage, hydrocephalus, extra-axial collection or mass lesion/mass effect. Vascular: No hyperdense vessel or unexpected calcification. Skull: Normal. Negative for fracture or focal lesion. Sinuses/Orbits: No acute finding. Other: None. IMPRESSION: No acute intracranial pathology. Electronically Signed   By: Virgina Norfolk M.D.   On: 08/21/2021 03:49   CT Cervical Spine Wo Contrast  Result Date: 08/21/2021 CLINICAL DATA:  Status post seizure. EXAM: CT CERVICAL SPINE WITHOUT CONTRAST TECHNIQUE: Multidetector CT imaging of the cervical spine was performed without intravenous contrast. Multiplanar CT image reconstructions were also generated. COMPARISON:  None. FINDINGS: Alignment: Normal. Skull base and vertebrae: No acute fracture. No primary bone lesion or focal pathologic process. Soft tissues and spinal canal: No prevertebral fluid or swelling. No visible canal hematoma. Disc levels: Normal multilevel endplates are seen with normal multilevel intervertebral disc spaces. Normal bilateral multilevel facet joints are noted. Upper chest: Negative. Other: None. IMPRESSION: No acute fracture or subluxation of the cervical spine. Electronically Signed   By: Virgina Norfolk M.D.   On: 08/21/2021 03:51   DG Chest Portable 1 View  Result Date: 08/21/2021 CLINICAL DATA:  Initial evaluation for acute fever, seizure. EXAM: PORTABLE CHEST 1 VIEW COMPARISON:  Prior radiograph from 12/09/2020 FINDINGS: Marked cardiomegaly, stable. Mediastinal silhouette within normal limits. Lungs normally inflated. Prominent patchy airspace opacity seen throughout  the right upper and lower lobes, concerning for multifocal pneumonia. Possible patchy involvement of the left lung base as well. Underlying mild perihilar vascular congestion without overt pulmonary edema. Probable trace bilateral pleural effusions. No pneumothorax. No acute osseous finding. IMPRESSION: 1. Extensive patchy airspace disease throughout the right upper and lower lobes, concerning for multifocal pneumonia. 2. Underlying cardiomegaly with perihilar vascular congestion without overt pulmonary edema. 3. Probable trace bilateral pleural effusions. Electronically Signed   By: Jeannine Boga M.D.   On: 08/21/2021 01:18     Assessment and Plan:   Moderate to large pericardial effusion  No clinical evidence of tamponade.  Likely all secondary to her lupus diagnosis.  Formal echo read pending.    2.  Chronic systolic heart failure  Nonischemic.  NYHA class II-III symptoms.  Warm and dry on exam today.  On Coreg, spironolactone, Jardiance, torsemide as an outpatient. Not a candidate for Entresto/ACE/ARB because of severe renal impairment My bedside echo shows her ejection fraction is back down from her prior echo in September at [facility]  We will need to reassess her left ventricular  function when her hemoglobin has returned to normal range.  Primary team considering dialysis initiation.  We will closely monitor her volume status as this is being started.  She may be preload dependent in the setting of her pericardial effusion.   3.  Severe anemia  Management per primary team.  I would like to repeat her echocardiogram once her hemoglobin is returned to her normal range.     For questions or updates, please contact [place] Please consult [email or web address] for contact info under    Signed, Vickie Epley, MD  08/22/2021 1:16 PM

## 2021-08-23 ENCOUNTER — Inpatient Hospital Stay: Payer: Medicaid Other

## 2021-08-23 ENCOUNTER — Encounter
Admission: EM | Disposition: A | Discharge status: Other Acute Inpatient Hospital | Payer: Self-pay | Source: Home / Self Care | Attending: Internal Medicine

## 2021-08-23 ENCOUNTER — Other Ambulatory Visit (INDEPENDENT_AMBULATORY_CARE_PROVIDER_SITE_OTHER): Payer: Self-pay | Admitting: Vascular Surgery

## 2021-08-23 DIAGNOSIS — N185 Chronic kidney disease, stage 5: Secondary | ICD-10-CM

## 2021-08-23 DIAGNOSIS — I3139 Other pericardial effusion (noninflammatory): Secondary | ICD-10-CM | POA: Diagnosis not present

## 2021-08-23 DIAGNOSIS — N179 Acute kidney failure, unspecified: Secondary | ICD-10-CM

## 2021-08-23 DIAGNOSIS — I5023 Acute on chronic systolic (congestive) heart failure: Secondary | ICD-10-CM | POA: Diagnosis not present

## 2021-08-23 DIAGNOSIS — A419 Sepsis, unspecified organism: Secondary | ICD-10-CM

## 2021-08-23 HISTORY — PX: DIALYSIS/PERMA CATHETER INSERTION: CATH118288

## 2021-08-23 LAB — GLUCOSE, CAPILLARY
Glucose-Capillary: 106 mg/dL — ABNORMAL HIGH (ref 70–99)
Glucose-Capillary: 122 mg/dL — ABNORMAL HIGH (ref 70–99)
Glucose-Capillary: 133 mg/dL — ABNORMAL HIGH (ref 70–99)
Glucose-Capillary: 135 mg/dL — ABNORMAL HIGH (ref 70–99)
Glucose-Capillary: 139 mg/dL — ABNORMAL HIGH (ref 70–99)
Glucose-Capillary: 140 mg/dL — ABNORMAL HIGH (ref 70–99)
Glucose-Capillary: 154 mg/dL — ABNORMAL HIGH (ref 70–99)
Glucose-Capillary: 54 mg/dL — ABNORMAL LOW (ref 70–99)

## 2021-08-23 LAB — CBC
HCT: 23.4 % — ABNORMAL LOW (ref 36.0–46.0)
Hemoglobin: 8.1 g/dL — ABNORMAL LOW (ref 12.0–15.0)
MCH: 28.9 pg (ref 26.0–34.0)
MCHC: 34.6 g/dL (ref 30.0–36.0)
MCV: 83.6 fL (ref 80.0–100.0)
Platelets: 101 10*3/uL — ABNORMAL LOW (ref 150–400)
RBC: 2.8 MIL/uL — ABNORMAL LOW (ref 3.87–5.11)
RDW: 14 % (ref 11.5–15.5)
WBC: 21 10*3/uL — ABNORMAL HIGH (ref 4.0–10.5)
nRBC: 0 % (ref 0.0–0.2)

## 2021-08-23 LAB — TYPE AND SCREEN
ABO/RH(D): B POS
Antibody Screen: NEGATIVE
Unit division: 0
Unit division: 0

## 2021-08-23 LAB — HEPATITIS B CORE ANTIBODY, IGM: Hep B C IgM: NONREACTIVE

## 2021-08-23 LAB — URINALYSIS, COMPLETE (UACMP) WITH MICROSCOPIC
Bacteria, UA: NONE SEEN
Bilirubin Urine: NEGATIVE
Glucose, UA: NEGATIVE mg/dL
Ketones, ur: NEGATIVE mg/dL
Leukocytes,Ua: NEGATIVE
Nitrite: NEGATIVE
Protein, ur: >300 mg/dL — AB
Specific Gravity, Urine: 1.02 (ref 1.005–1.030)
pH: 7 (ref 5.0–8.0)

## 2021-08-23 LAB — HEPATIC FUNCTION PANEL
ALT: 17 U/L (ref 0–44)
AST: 22 U/L (ref 15–41)
Albumin: 2.7 g/dL — ABNORMAL LOW (ref 3.5–5.0)
Alkaline Phosphatase: 48 U/L (ref 38–126)
Bilirubin, Direct: <0.1 mg/dL (ref 0.0–0.2)
Total Bilirubin: 0.9 mg/dL (ref 0.3–1.2)
Total Protein: 6.1 g/dL — ABNORMAL LOW (ref 6.5–8.1)

## 2021-08-23 LAB — BPAM RBC
Blood Product Expiration Date: 202211282359
Blood Product Expiration Date: 202212142359
ISSUE DATE / TIME: 202211270942
ISSUE DATE / TIME: 202211271339
Unit Type and Rh: 7300
Unit Type and Rh: 9500

## 2021-08-23 LAB — BASIC METABOLIC PANEL
Anion gap: 11 (ref 5–15)
BUN: 89 mg/dL — ABNORMAL HIGH (ref 6–20)
CO2: 18 mmol/L — ABNORMAL LOW (ref 22–32)
Calcium: 8.2 mg/dL — ABNORMAL LOW (ref 8.9–10.3)
Chloride: 106 mmol/L (ref 98–111)
Creatinine, Ser: 9.11 mg/dL — ABNORMAL HIGH (ref 0.44–1.00)
GFR, Estimated: 6 mL/min — ABNORMAL LOW (ref >60)
Glucose, Bld: 142 mg/dL — ABNORMAL HIGH (ref 70–99)
Potassium: 5.7 mmol/L — ABNORMAL HIGH (ref 3.5–5.1)
Sodium: 135 mmol/L (ref 135–145)

## 2021-08-23 LAB — PHOSPHORUS
Phosphorus: 6.7 mg/dL — ABNORMAL HIGH (ref 2.5–4.6)
Phosphorus: 7.2 mg/dL — ABNORMAL HIGH (ref 2.5–4.6)

## 2021-08-23 LAB — SEDIMENTATION RATE: Sed Rate: 12 mm/hr (ref 0–20)

## 2021-08-23 LAB — MAGNESIUM: Magnesium: 2.7 mg/dL — ABNORMAL HIGH (ref 1.7–2.4)

## 2021-08-23 LAB — HEPATITIS B SURFACE ANTIBODY,QUALITATIVE: Hep B S Ab: REACTIVE — AB

## 2021-08-23 LAB — POTASSIUM: Potassium: 5.6 mmol/L — ABNORMAL HIGH (ref 3.5–5.1)

## 2021-08-23 LAB — HEPATITIS B SURFACE ANTIGEN: Hepatitis B Surface Ag: NONREACTIVE

## 2021-08-23 SURGERY — DIALYSIS/PERMA CATHETER INSERTION
Anesthesia: Moderate Sedation

## 2021-08-23 MED ORDER — SODIUM CHLORIDE 0.9 % IV SOLN: 100.0000 mL | INTRAVENOUS | Status: DC | PRN

## 2021-08-23 MED ORDER — PATIROMER SORBITEX CALCIUM 8.4 G PO PACK
8.4000 g | PACK | Freq: Once | ORAL | Status: AC
  Administered 2021-08-24: 8.4 g via ORAL
  Filled 2021-08-23: qty 1

## 2021-08-23 MED ORDER — MIDAZOLAM HCL 2 MG/2ML IJ SOLN
INTRAMUSCULAR | Status: DC | PRN
  Administered 2021-08-23: 2 mg via INTRAVENOUS

## 2021-08-23 MED ORDER — SODIUM CHLORIDE 0.9 % IV SOLN: INTRAVENOUS | Status: DC

## 2021-08-23 MED ORDER — ONDANSETRON HCL 4 MG/2ML IJ SOLN
4.0000 mg | Freq: Four times a day (QID) | INTRAMUSCULAR | Status: DC | PRN
  Administered 2021-08-27: 4 mg via INTRAVENOUS

## 2021-08-23 MED ORDER — MIDAZOLAM HCL 2 MG/ML PO SYRP: 8.0000 mg | ORAL_SOLUTION | Freq: Once | ORAL | Status: DC | PRN

## 2021-08-23 MED ORDER — CHLORTHALIDONE 25 MG PO TABS
50.0000 mg | ORAL_TABLET | Freq: Every day | ORAL | Status: DC
  Administered 2021-08-24 – 2021-08-26 (×4): 50 mg via ORAL
  Filled 2021-08-23 (×6): qty 2

## 2021-08-23 MED ORDER — CHLORHEXIDINE GLUCONATE CLOTH 2 % EX PADS
6.0000 | MEDICATED_PAD | Freq: Every day | CUTANEOUS | Status: DC
  Administered 2021-08-23 – 2021-08-27 (×5): 6 via TOPICAL

## 2021-08-23 MED ORDER — HEPARIN SODIUM (PORCINE) 1000 UNIT/ML DIALYSIS
1000.0000 [IU] | INTRAMUSCULAR | Status: DC | PRN
  Filled 2021-08-23: qty 1

## 2021-08-23 MED ORDER — LIDOCAINE HCL (PF) 1 % IJ SOLN
5.0000 mL | INTRAMUSCULAR | Status: DC | PRN
  Filled 2021-08-23: qty 5

## 2021-08-23 MED ORDER — LIDOCAINE-PRILOCAINE 2.5-2.5 % EX CREA
1.0000 "application " | TOPICAL_CREAM | CUTANEOUS | Status: DC | PRN
  Filled 2021-08-23: qty 5

## 2021-08-23 MED ORDER — PENTAFLUOROPROP-TETRAFLUOROETH EX AERO
1.0000 "application " | INHALATION_SPRAY | CUTANEOUS | Status: DC | PRN
  Filled 2021-08-23: qty 30

## 2021-08-23 MED ORDER — DIPHENHYDRAMINE HCL 50 MG/ML IJ SOLN: 50.0000 mg | Freq: Once | INTRAMUSCULAR | Status: DC | PRN

## 2021-08-23 MED ORDER — INSULIN ASPART 100 UNIT/ML IV SOLN
10.0000 [IU] | Freq: Once | INTRAVENOUS | Status: AC
  Administered 2021-08-23: 08:00:00 10 [IU] via INTRAVENOUS
  Filled 2021-08-23: qty 0.1

## 2021-08-23 MED ORDER — HYDROMORPHONE HCL 1 MG/ML IJ SOLN
1.0000 mg | Freq: Once | INTRAMUSCULAR | Status: AC | PRN
  Administered 2021-08-27: 1 mg via INTRAVENOUS
  Filled 2021-08-23: qty 1

## 2021-08-23 MED ORDER — LABETALOL HCL 5 MG/ML IV SOLN
10.0000 mg | INTRAVENOUS | Status: DC | PRN
  Administered 2021-08-23 – 2021-08-24 (×3): 10 mg via INTRAVENOUS
  Filled 2021-08-23 (×5): qty 4

## 2021-08-23 MED ORDER — DEXTROSE 50 % IV SOLN
1.0000 | Freq: Once | INTRAVENOUS | Status: AC
  Administered 2021-08-23: 08:00:00 50 mL via INTRAVENOUS
  Filled 2021-08-23: qty 50

## 2021-08-23 MED ORDER — FAMOTIDINE 20 MG PO TABS: 40.0000 mg | ORAL_TABLET | Freq: Once | ORAL | Status: DC | PRN

## 2021-08-23 MED ORDER — METHYLPREDNISOLONE SODIUM SUCC 125 MG IJ SOLR: 125.0000 mg | Freq: Once | INTRAMUSCULAR | Status: DC | PRN

## 2021-08-23 MED ORDER — CEFAZOLIN SODIUM-DEXTROSE 1-4 GM/50ML-% IV SOLN
1.0000 g | Freq: Once | INTRAVENOUS | Status: AC
  Administered 2021-08-23: 15:00:00 1 g via INTRAVENOUS

## 2021-08-23 MED ORDER — FENTANYL CITRATE PF 50 MCG/ML IJ SOSY
PREFILLED_SYRINGE | INTRAMUSCULAR | Status: AC
  Filled 2021-08-23: qty 1

## 2021-08-23 MED ORDER — MIDAZOLAM HCL 2 MG/2ML IJ SOLN
INTRAMUSCULAR | Status: AC
  Filled 2021-08-23: qty 2

## 2021-08-23 MED ORDER — LABETALOL HCL 5 MG/ML IV SOLN
20.0000 mg | Freq: Once | INTRAVENOUS | Status: AC
Start: 2021-08-24 — End: 2021-08-24
  Administered 2021-08-24: 20 mg via INTRAVENOUS

## 2021-08-23 MED ORDER — FENTANYL CITRATE (PF) 100 MCG/2ML IJ SOLN
INTRAMUSCULAR | Status: DC | PRN
  Administered 2021-08-23: 50 ug via INTRAVENOUS

## 2021-08-23 MED ORDER — SODIUM BICARBONATE 8.4 % IV SOLN
50.0000 meq | Freq: Once | INTRAVENOUS | Status: AC
  Administered 2021-08-23: 08:00:00 50 meq via INTRAVENOUS
  Filled 2021-08-23: qty 50

## 2021-08-23 MED ORDER — ALTEPLASE 2 MG IJ SOLR
2.0000 mg | Freq: Once | INTRAMUSCULAR | Status: DC | PRN
  Filled 2021-08-23: qty 2

## 2021-08-23 SURGICAL SUPPLY — 8 items
BIOPATCH RED 1 DISK 7.0 (GAUZE/BANDAGES/DRESSINGS) ×1 IMPLANT
CATH CANNON HEMO 15FR 19 (HEMODIALYSIS SUPPLIES) ×1 IMPLANT
COVER PROBE U/S 5X48 (MISCELLANEOUS) ×1 IMPLANT
DERMABOND ADVANCED (GAUZE/BANDAGES/DRESSINGS) ×1
DERMABOND ADVANCED .7 DNX12 (GAUZE/BANDAGES/DRESSINGS) IMPLANT
PACK ANGIOGRAPHY (CUSTOM PROCEDURE TRAY) ×1 IMPLANT
SUT MNCRL AB 4-0 PS2 18 (SUTURE) ×1 IMPLANT
SUT PROLENE 0 CT 1 30 (SUTURE) ×1 IMPLANT

## 2021-08-23 NOTE — Op Note (Signed)
OPERATIVE NOTE    PRE-OPERATIVE DIAGNOSIS: 1. ESRD   POST-OPERATIVE DIAGNOSIS: same as above  PROCEDURE: Ultrasound guidance for vascular access to the right internal jugular vein Fluoroscopic guidance for placement of catheter Placement of a 19 cm tip to cuff tunneled hemodialysis catheter via the right internal jugular vein  SURGEON: Leotis Pain, MD  ANESTHESIA:  Local with Moderate conscious sedation for approximately 20 minutes using 2 mg of Versed and 50 mcg of Fentanyl  ESTIMATED BLOOD LOSS: 10 cc  FLUORO TIME: less than one minute  CONTRAST: none  FINDING(S): 1.  Patent right internal jugular vein  SPECIMEN(S):  None  INDICATIONS:   Melinda Steele is a 20 y.o.female who presents with renal failure and loss of her last permcath last week.  The patient needs long term dialysis access for their ESRD, and a Permcath is necessary.  Risks and benefits are discussed and informed consent is obtained.    DESCRIPTION: After obtaining full informed written consent, the patient was brought back to the vascular suited. The patient's right neck and chest were sterilely prepped and draped in a sterile surgical field was created. Moderate conscious sedation was administered during a face to face encounter with the patient throughout the procedure with my supervision of the RN administering medicines and monitoring the patient's vital signs, pulse oximetry, telemetry and mental status throughout from the start of the procedure until the patient was taken to the recovery room.  The right internal jugular vein was visualized with ultrasound and found to be patent. It was then accessed under direct ultrasound guidance and a permanent image was recorded. A wire was placed. After skin nick and dilatation, the peel-away sheath was placed over the wire. I then turned my attention to an area under the clavicle. Approximately 1-2 fingerbreadths below the clavicle a small counterincision was created and  tunneled from the subclavicular incision to the access site. Using fluoroscopic guidance, a 19 centimeter tip to cuff tunneled hemodialysis catheter was selected, and tunneled from the subclavicular incision to the access site. It was then placed through the peel-away sheath and the peel-away sheath was removed. Using fluoroscopic guidance the catheter tips were parked in the right atrium. The appropriate distal connectors were placed. It withdrew blood well and flushed easily with heparinized saline and a concentrated heparin solution was then placed. It was secured to the chest wall with 2 Prolene sutures. The access incision was closed single 4-0 Monocryl. A 4-0 Monocryl pursestring suture was placed around the exit site. Sterile dressings were placed. The patient tolerated the procedure well and was taken to the recovery room in stable condition.  COMPLICATIONS: None  CONDITION: Stable  Leotis Pain, MD 08/23/2021 3:05 PM   This note was created with Dragon Medical transcription system. Any errors in dictation are purely unintentional.

## 2021-08-23 NOTE — Progress Notes (Signed)
Progress Note  Patient Name: Melinda Steele Date of Encounter: 08/23/2021  [facility] Cardiologist: [facility]   Subjective   The patient denies chest pain or shortness of breath.  No significant improvement in renal function with plans to start dialysis today.  Inpatient Medications    Scheduled Meds:  carvedilol  25 mg Oral Daily   Chlorhexidine Gluconate Cloth  6 each Topical Q0600   Chlorhexidine Gluconate Cloth  6 each Topical Q0600   cloNIDine  0.3 mg Transdermal Weekly   heparin  5,000 Units Subcutaneous Q8H   hydroxychloroquine  400 mg Oral Daily   methylPREDNISolone (SOLU-MEDROL) injection  80 mg Intravenous Q12H   NIFEdipine  90 mg Oral Daily   pantoprazole (PROTONIX) IV  40 mg Intravenous Q24H   patiromer  8.4 g Oral Daily   patiromer  8.4 g Oral Once   pravastatin  20 mg Oral Daily   Continuous Infusions:  sodium chloride     sodium chloride     azithromycin Stopped (08/22/21 1005)   cefTRIAXone (ROCEPHIN)  IV Stopped (08/22/21 1639)   lacosamide (VIMPAT) IV 150 mg (08/23/21 1111)   PRN Meds: sodium chloride, sodium chloride, acetaminophen, alteplase, bismuth subsalicylate, docusate sodium, heparin, hydrALAZINE, labetalol, levalbuterol, lidocaine (PF), lidocaine-prilocaine, LORazepam, ondansetron (ZOFRAN) IV, oxyCODONE, pentafluoroprop-tetrafluoroeth, polyethylene glycol   Vital Signs    Vitals:   08/23/21 0500 08/23/21 0600 08/23/21 0800 08/23/21 1000  BP: (!) 144/98 (!) 162/113 (!) 177/132 (!) 148/113  Pulse: (!) 110 (!) 115 (!) 115 (!) 108  Resp: (!) 35 (!) 31 (!) 28 (!) 28  Temp:      TempSrc:      SpO2: 93% 99% 97% 95%  Weight: 70.5 kg     Height:        Intake/Output Summary (Last 24 hours) at 08/23/2021 1149 Last data filed at 08/23/2021 1000 Gross per 24 hour  Intake 1651.08 ml  Output --  Net 1651.08 ml   Last 3 Weights 08/23/2021 08/22/2021 08/21/2021  Weight (lbs) 155 lb 6.8 oz 156 lb 4.9 oz 160 lb  Weight (kg) 70.5 kg 70.9 kg 72.576  kg      Telemetry    Sinus tachycardia- Personally Reviewed  ECG     - Personally Reviewed  Physical Exam   GEN: No acute distress.   Neck: No JVD Cardiac: Regular rhythm but tachycardic, no murmurs, rubs, or gallops.  Respiratory: Clear to auscultation bilaterally. GI: Soft, nontender, non-distended  MS: No edema; No deformity. Neuro:  Nonfocal  Psych: Normal affect   Labs    High Sensitivity Troponin:   Recent Labs  Lab 08/21/21 0101 08/21/21 0531 08/21/21 0957  TROPONINIHS 538* 619* 645*     Chemistry Recent Labs  Lab 08/21/21 0100 08/21/21 0531 08/22/21 0409 08/22/21 2008 08/23/21 0548  NA 137 139 137  --  135  K 5.1 5.1 6.1* 5.8* 5.7*  CL 110 112* 106  --  106  CO2 17* 19* 20*  --  18*  GLUCOSE 80 157* 133*  --  142*  BUN 86* 88* 97*  --  89*  CREATININE 7.89* 7.90* 8.78*  --  9.11*  CALCIUM 7.7* 7.3* 7.7*  --  8.2*  MG  --  1.7 2.9*  --  2.7*  PROT 6.2*  --   --   --  6.1*  ALBUMIN 3.2*  --   --   --  2.7*  AST 20  --   --   --  22  ALT 15  --   --   --  17  ALKPHOS 43  --   --   --  48  BILITOT 0.6  --   --   --  0.9  GFRNONAA 7* 7* 6*  --  6*  ANIONGAP 10 8 11   --  11    Lipids  Recent Labs  Lab 08/21/21 0531  CHOL 129  TRIG 102  HDL 42  LDLCALC 67  CHOLHDL 3.1    Hematology Recent Labs  Lab 08/21/21 0531 08/21/21 0957 08/22/21 0409 08/22/21 1626 08/23/21 0548  WBC 13.6*  --  32.8*  --  21.0*  RBC 2.31*  --  2.10*  --  2.80*  HGB 6.3*   < > 5.9* 7.8* 8.1*  HCT 20.1*   < > 18.2* 23.2* 23.4*  MCV 87.0  --  86.7  --  83.6  MCH 27.3  --  28.1  --  28.9  MCHC 31.3  --  32.4  --  34.6  RDW 13.1  --  13.2  --  14.0  PLT 128*  --  120*  --  101*   < > = values in this interval not displayed.   Thyroid No results for input(s): TSH, FREET4 in the last 168 hours.  BNP Recent Labs  Lab 08/21/21 0101 08/21/21 0531  BNP >4,500.0* >4,500.0*    DDimer No results for input(s): DDIMER in the last 168 hours.   Radiology    CT  ABDOMEN PELVIS WO CONTRAST  Result Date: 08/22/2021 CLINICAL DATA:  Abdominal distension Nausea/vomiting Abdominal pain, acute, nonlocalized EXAM: CT ABDOMEN AND PELVIS WITHOUT CONTRAST TECHNIQUE: Multidetector CT imaging of the abdomen and pelvis was performed following the standard protocol without IV contrast. COMPARISON:  Chest x-ray 08/21/2021 FINDINGS: Lower chest: Marked peribronchovascular ground-glass airspace opacities of the visualized lung. At least small volume pericardial effusion partially visualized. Trace right pleural effusion. Hepatobiliary: No focal liver abnormality. Likely question gallbladder wall thickening with markedly limited evaluation on this noncontrast study. No biliary dilatation. Query mild periportal edema. Pancreas: No focal lesion. Normal pancreatic contour. No surrounding inflammatory changes. No main pancreatic ductal dilatation. Spleen: Normal in size without focal abnormality. Adrenals/Urinary Tract: No adrenal nodule bilaterally. No nephrolithiasis and no hydronephrosis. No definite contour-deforming renal mass. No ureterolithiasis or hydroureter. The urinary bladder is unremarkable. Stomach/Bowel: Stomach is within normal limits. No evidence of bowel wall thickening or dilatation. Appendix appears normal. Vascular/Lymphatic: No abdominal aorta or iliac aneurysm. No abdominal, pelvic, or inguinal lymphadenopathy. Reproductive: Uterus and bilateral adnexa are unremarkable. Other: Trace free fluid within the abdomen and pelvis. No intraperitoneal free gas. No organized fluid collection. Musculoskeletal: No abdominal wall hernia or abnormality. No suspicious lytic or blastic osseous lesions. No acute displaced fracture. IMPRESSION: 1. Pulmonary findings suggestive of infection/inflammation versus edema versus alveolar hemorrhage. 2. Partially visualized at least small volume pericardial effusion. 3. Trace right pleural effusion. 4. Likely gallbladder wall thickening with  markedly limited evaluation on this noncontrast study. Recommend right upper quadrant ultrasound for further evaluation. 5. Trace simple free fluid within the abdomen and pelvis. Electronically Signed   By: Iven Finn M.D.   On: 08/22/2021 23:59   ECHOCARDIOGRAM COMPLETE  Result Date: 08/22/2021    ECHOCARDIOGRAM REPORT   Patient Name:   [facility] Date of Exam: 08/22/2021 Medical Rec #:  [id number]    Height:       66.0 in Accession #:    [id number]   Weight:  156.3 lb Date of Birth:  [birth year]    BSA:          1.801 m Patient Age:    20 years     BP:           149/87 mmHg Patient Gender: F            HR:           106 bpm. Exam Location:  [facility] Procedure: 2D Echo, Color Doppler and Cardiac Doppler Indications:     I42.0 Dilated cardiomyopathy  History:         Patient has prior history of Echocardiogram examinations.                  Lupus; Risk Factors:Hypertension.  Sonographer:     Charmayne Sheer Referring Phys:  [id number] BRITTON L R[place] Diagnosing Phys: Ida Rogue MD  Sonographer Comments: Suboptimal apical window and suboptimal subcostal window. Image acquisition challenging due to uncooperative patient. Global longitudinal strain was attempted. IMPRESSIONS  1. Moderate pericardial effusion. The pericardial effusion is posterior and lateral to the left ventricle. There is no evidence of cardiac tamponade.  2. Left ventricular ejection fraction, by estimation, is 30 to 35%. The left ventricle has moderately decreased function. The left ventricle demonstrates global hypokinesis. There is mild left ventricular hypertrophy. Left ventricular diastolic parameters are consistent with Grade II diastolic dysfunction (pseudonormalization). The average left ventricular global longitudinal strain is -5.1 %. The global longitudinal strain is abnormal.  3. Right ventricular systolic function is normal. The right ventricular size is normal. There is normal pulmonary artery systolic pressure. The  estimated right ventricular systolic pressure is 45.4 mmHg.  4. Left atrial size was moderately dilated.  5. Right atrial size was mildly dilated.  6. The mitral valve is normal in structure. Mild mitral valve regurgitation. No evidence of mitral stenosis.  7. Tricuspid valve regurgitation is moderate.  8. The inferior vena cava is normal in size with greater than 50% respiratory variability, suggesting right atrial pressure of 3 mmHg. FINDINGS  Left Ventricle: Left ventricular ejection fraction, by estimation, is 30 to 35%. The left ventricle has moderately decreased function. The left ventricle demonstrates global hypokinesis. The average left ventricular global longitudinal strain is -5.1 %.  The global longitudinal strain is abnormal. The left ventricular internal cavity size was normal in size. There is mild left ventricular hypertrophy. Left ventricular diastolic parameters are consistent with Grade II diastolic dysfunction (pseudonormalization). Right Ventricle: The right ventricular size is normal. No increase in right ventricular wall thickness. Right ventricular systolic function is normal. There is normal pulmonary artery systolic pressure. The tricuspid regurgitant velocity is 2.25 m/s, and  with an assumed right atrial pressure of 5 mmHg, the estimated right ventricular systolic pressure is 09.8 mmHg. Left Atrium: Left atrial size was moderately dilated. Right Atrium: Right atrial size was mildly dilated. Pericardium: A moderately sized pericardial effusion is present. The pericardial effusion is posterior and lateral to the left ventricle. There is no evidence of cardiac tamponade. Mitral Valve: The mitral valve is normal in structure. There is mild thickening of the mitral valve leaflet(s). There is mild calcification of the mitral valve leaflet(s). Mild mitral valve regurgitation. No evidence of mitral valve stenosis. MV peak gradient, 5.0 mmHg. The mean mitral valve gradient is 3.0 mmHg. Tricuspid  Valve: The tricuspid valve is normal in structure. Tricuspid valve regurgitation is moderate . No evidence of tricuspid stenosis. Aortic Valve: The aortic valve is normal in structure. Aortic  valve regurgitation is not visualized. No aortic stenosis is present. Aortic valve mean gradient measures 2.0 mmHg. Aortic valve peak gradient measures 2.7 mmHg. Aortic valve area, by VTI measures 2.73 cm. Pulmonic Valve: The pulmonic valve was normal in structure. Pulmonic valve regurgitation is mild. No evidence of pulmonic stenosis. Aorta: The aortic root is normal in size and structure. Venous: The inferior vena cava is normal in size with greater than 50% respiratory variability, suggesting right atrial pressure of 3 mmHg. IAS/Shunts: No atrial level shunt detected by color flow Doppler.  LEFT VENTRICLE PLAX 2D LVIDd:         5.40 cm   Diastology LVIDs:         4.40 cm   LV e' medial:    7.51 cm/s LV PW:         1.30 cm   LV E/e' medial:  12.9 LV IVS:        1.10 cm   LV e' lateral:   8.81 cm/s LVOT diam:     2.10 cm   LV E/e' lateral: 11.0 LV SV:         33 LV SV Index:   18        2D Longitudinal Strain LVOT Area:     3.46 cm  2D Strain GLS Avg:     -5.1 %  RIGHT VENTRICLE RV Basal diam:  3.70 cm RV S prime:     7.83 cm/s LEFT ATRIUM             Index        RIGHT ATRIUM           Index LA diam:        5.00 cm 2.78 cm/m   RA Area:     20.60 cm LA Vol (A2C):   30.6 ml 16.99 ml/m  RA Volume:   55.70 ml  30.93 ml/m LA Vol (A4C):   52.4 ml 29.10 ml/m LA Biplane Vol: 44.1 ml 24.49 ml/m  AORTIC VALVE                    PULMONIC VALVE AV Area (Vmax):    3.01 cm     PV Vmax:       0.70 m/s AV Area (Vmean):   2.97 cm     PV Vmean:      47.100 cm/s AV Area (VTI):     2.73 cm     PV VTI:        0.134 m AV Vmax:           81.90 cm/s   PV Peak grad:  2.0 mmHg AV Vmean:          59.800 cm/s  PV Mean grad:  1.0 mmHg AV VTI:            0.120 m AV Peak Grad:      2.7 mmHg AV Mean Grad:      2.0 mmHg LVOT Vmax:         71.10  cm/s LVOT Vmean:        51.200 cm/s LVOT VTI:          0.095 m LVOT/AV VTI ratio: 0.79  AORTA Ao Root diam: 3.20 cm MITRAL VALVE               TRICUSPID VALVE MV Area (PHT): 6.07 cm    TR Peak grad:   20.2 mmHg MV Area VTI:   2.75 cm  TR Vmax:        225.00 cm/s MV Peak grad:  5.0 mmHg MV Mean grad:  3.0 mmHg    SHUNTS MV Vmax:       1.12 m/s    Systemic VTI:  0.09 m MV Vmean:      70.6 cm/s   Systemic Diam: 2.10 cm MV Decel Time: 125 msec MV E velocity: 96.65 cm/s Ida Rogue MD Electronically signed by Ida Rogue MD Signature Date/Time: 08/22/2021/2:05:10 PM    Final    US Abdomen Limited RUQ (LIVER/GB)  Result Date: 08/23/2021 CLINICAL DATA:  History of chronic renal disease with gallbladder wall thickening seen on recent abdomen pelvis CT. EXAM: ULTRASOUND ABDOMEN LIMITED RIGHT UPPER QUADRANT COMPARISON:  Abdomen pelvis CT, dated August 22, 2021. FINDINGS: Gallbladder: A thin, curvilinear nonshadowing echogenic area is seen along the wall of the gallbladder fundus. No gallstones or wall thickening visualized (2.8 mm). No sonographic Murphy sign noted by sonographer. Common bile duct: Diameter: 4.2 mm Liver: No focal lesion identified. Within normal limits in parenchymal echogenicity. Portal vein is patent on color Doppler imaging with normal direction of blood flow towards the liver. Other: Of incidental note is the presence a right-sided pleural effusion and an echogenic right kidney. IMPRESSION: 1. Thin, echogenic area along the wall of the gallbladder fundus which may represent very mild gallbladder wall calcification. 2. No evidence of acute cholecystitis. Further evaluation with a nuclear medicine hepatobiliary scan is recommended if this is of clinical concern. 3. Small right pleural effusion. 4. Echogenic right kidney which may be secondary to medical renal disease related to the patient's history of chronic renal disease. Electronically Signed   By: Virgina Norfolk M.D.   On:  08/23/2021 02:15    Cardiac Studies   I personally viewed her echocardiogram which was done yesterday.  It showed moderately reduced LV systolic function with an EF of 35% with global hypokinesis.  There was moderate size mostly posterior pericardial effusion with no evidence of tamponade.  IVC was normal in size with normal respiratory variability.  Patient Profile     20 y.o. female with a hx of seizures, lupus, chronic systolic heart failure, glomerulonephritis, CKD 3B, hemolytic anemia and chronic pericardial effusions who is being seen 08/22/2021 for the evaluation of pericardial effusion at the request of Dr. Lanney Gins  Assessment & Plan    1.  Pericardial effusion: This seems to be chronic and thought to be inflammatory in etiology due to underlying lupus.  This has been described on previous echocardiograms at [facility] as well as at [facility].  Current effusion seems to be at most moderate in size and mostly posterior.  No signs of tamponade.  The patient used to be on colchicine in the past but it was discontinued as it was felt that this is chronic inflammation.  I agree that there is likely limited role for anti-inflammatory medications at this point especially that she has progressed to end-stage renal disease.  2.  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  Continue carvedilol.  As an outpatient, she was on spironolactone and empagliflozin.  Empagliflozin is contraindicated in end-stage renal disease and should not be resumed.  If there is no chance of renal recovery, we could consider resuming spironolactone and also starting Entresto.  Otherwise, we will consider a combination of hydralazine and long-acting nitroglycerin.  3.  Acute on chronic kidney disease: Likely due to lupus.  Plans to start dialysis  today.  Managed by nephrology.  4.  Severe anemia: Improved with transfusion.  Total encounter time more than 35  minutes. Greater than 50% was spent in counseling and coordination of care with the patient.   For questions or updates, please contact Catharine Please consult [email or web address] for contact info under        Signed, Kathlyn Sacramento, MD  08/23/2021, 11:49 AM

## 2021-08-23 NOTE — Progress Notes (Signed)
PHARMACY CONSULT NOTE - FOLLOW UP  Pharmacy Consult for Electrolyte Monitoring and Replacement   Recent Labs: Potassium (mmol/L)  Date Value  08/23/2021 5.7 (H)   Magnesium (mg/dL)  Date Value  08/23/2021 2.7 (H)   Calcium (mg/dL)  Date Value  08/23/2021 8.2 (L)   Albumin (g/dL)  Date Value  08/23/2021 2.7 (L)   Phosphorus (mg/dL)  Date Value  08/23/2021 7.2 (H)   Sodium (mmol/L)  Date Value  08/23/2021 135     Assessment: 20 year old female presenting to [facility] ED from home via EMS on 08/21/2021 after reports of an 8 minute seizure at home. Patient with h/o lupus. Pharmacy consult for management of electrolytes.   Goal of Therapy:  Elctrolytes WNL   Plan:  --Hyperkalemia - on [place] --Plan for dialysis --Continue to follow along  Tawnya Crook, PharmD, BCPS Clinical Pharmacist 08/23/2021 11:51 AM

## 2021-08-23 NOTE — H&P (View-Only) (Signed)
[facility] Vascular Consult Note  MRN : [id number]  Melinda Steele is a 20 y.o. 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  She started to have a cough around Wednesday the night prior to Thanksgiving and had some general fatigue Thursday and Friday. In this setting she missed her Friday evening medications including Vimpat. Subsequently she had an 8-minute generalized tonic-clonic seizure for which EMS was activated.  She arrived to the ED in postictal state was found to be febrile to 103.8.  Initially received cefepime, vancomycin and metronidazole, now narrowed to ceftriaxone and azithromycin.  She was additionally loaded with 200 mg IV Vimpat.  She has not had any further seizures.   Work-up was concerning for multifocal pneumonia, and she required BiPAP which she has recently been taken off of but still on high flow oxygen.    Regarding her seizure history, per review of [facility] neurology notes, she had her first event in January 2022 with left gaze deviation and decerebrate posturing.  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 Her cyclophosphamide was initially stopped and then possibly restarted. The patient's mother who is active in her care does not recognize this medication and reports perhaps her cyclophosphamide was initially changed to mycophenolate subsequently changed to belimumab which is her primary immunosuppression although she also remains on Plaquenil and prednisone.  Mom reports she was initially on  Keppra 2g twice daily and that this was changed to Vimpat 150 twice daily secondary to breakthrough seizure; on chart review there are notes that she was changed secondary to her renal function.  Additionally there were some plans to do lumbar puncture to more definitively evaluate for CNS lupus but no records of this being done are available.   Regarding her cardiomyopathy, her EF was as low as 20% but on last cardiology follow-up had improved to 55 to 60% (echo 06/01/2021), and is presumed to be related to her lupus.  Nephrology has been consulted for evaluation and management of acute kidney injury in the setting of chronic kidney disease stage IV and known class IV lupus nephritis.  She has been treated with Benlysta and reports adherence with the medication.  She is regularly followed by [facility] nephrology.  Back on 04/23/2021 creatinine was 3.12 with an EGFR of 21.  However there is significant worsening in her underlying renal function now with BUN up to 88, creatinine 7.9, in EGFR down to 7.  She also appears to have significant anemia with a hemoglobin of 6.4 at the moment.  Vascular Surgery was consulted by Colon Flattery, NP for PermCath in the setting of acute on chronic renal failure.  Current Facility-Administered Medications  Medication Dose Route Frequency Provider Last Rate Last Admin   0.9 %  sodium chloride infusion  100 mL Intravenous PRN Lateef, Munsoor, MD       0.9 %  sodium chloride infusion  100 mL Intravenous PRN Lateef, Munsoor, MD       acetaminophen (TYLENOL) tablet 650 mg  650 mg Oral Q6H PRN Rust-Chester, Huel Cote, NP   650 mg at 08/22/21 2223  alteplase (CATHFLO ACTIVASE) injection 2 mg  2 mg Intracatheter Once PRN Lateef, Munsoor, MD       azithromycin (ZITHROMAX) 500 mg in sodium chloride 0.9 % 250 mL IVPB  500 mg Intravenous Q24H Oswald Hillock, RPH 250 mL/hr at 08/23/21 1209 500 mg at 08/23/21 9326   bismuth subsalicylate (PEPTO BISMOL) chewable tablet 524 mg  524 mg  Oral Q4H PRN Ottie Glazier, MD   524 mg at 08/22/21 2000   carvedilol (COREG) tablet 25 mg  25 mg Oral Daily Rust-Chester, Huel Cote, NP   25 mg at 08/22/21 0942   cefTRIAXone (ROCEPHIN) 2 g in sodium chloride 0.9 % 100 mL IVPB  2 g Intravenous Q24H Rust-Chester, Huel Cote, NP   Stopped at 08/22/21 1639   Chlorhexidine Gluconate Cloth 2 % PADS 6 each  6 each Topical Q0600 Ottie Glazier, MD   6 each at 08/21/21 0414   Chlorhexidine Gluconate Cloth 2 % PADS 6 each  6 each Topical Q0600 Holley Raring, Munsoor, MD   6 each at 08/23/21 1209   cloNIDine (CATAPRES - Dosed in mg/24 hr) patch 0.3 mg  0.3 mg Transdermal Weekly Rust-Chester, Toribio Harbour L, NP   0.3 mg at 08/22/21 2327   docusate sodium (COLACE) capsule 100 mg  100 mg Oral BID PRN Rust-Chester, Toribio Harbour L, NP   100 mg at 08/22/21 1002   heparin injection 1,000 Units  1,000 Units Dialysis PRN Holley Raring, Munsoor, MD       heparin injection 5,000 Units  5,000 Units Subcutaneous Q8H Rust-Chester, Huel Cote, NP   5,000 Units at 08/23/21 0559   hydrALAZINE (APRESOLINE) injection 10-20 mg  10-20 mg Intravenous Q4H PRN Rust-Chester, Huel Cote, NP   20 mg at 08/23/21 0106   hydroxychloroquine (PLAQUENIL) tablet 400 mg  400 mg Oral Daily Rust-Chester, Huel Cote, NP   400 mg at 08/22/21 0942   labetalol (NORMODYNE) injection 10 mg  10 mg Intravenous Q2H PRN Bradly Bienenstock, NP   10 mg at 08/23/21 7124   lacosamide (VIMPAT) 150 mg in sodium chloride 0.9 % 25 mL IVPB  150 mg Intravenous Q12H Rust-Chester, Britton L, NP 80 mL/hr at 08/23/21 1111 150 mg at 08/23/21 1111   levalbuterol (XOPENEX) nebulizer solution 1.25 mg  1.25 mg Nebulization Q6H PRN Rust-Chester, Britton L, NP       lidocaine (PF) (XYLOCAINE) 1 % injection 5 mL  5 mL Intradermal PRN Lateef, Munsoor, MD       lidocaine-prilocaine (EMLA) cream 1 application  1 application Topical PRN Lateef, Munsoor, MD       LORazepam (ATIVAN) injection 2 mg  2 mg Intravenous Q1H PRN Rust-Chester, Britton L, NP        methylPREDNISolone sodium succinate (SOLU-MEDROL) 125 mg/2 mL injection 80 mg  80 mg Intravenous Q12H Emmaline Kluver., MD   80 mg at 08/23/21 0809   NIFEdipine (PROCARDIA-XL/NIFEDICAL-XL) 24 hr tablet 90 mg  90 mg Oral Daily Dorothe Pea, RPH   90 mg at 08/22/21 1121   ondansetron (ZOFRAN) injection 4 mg  4 mg Intravenous Q6H PRN Rust-Chester, Toribio Harbour L, NP       oxyCODONE (Oxy IR/ROXICODONE) immediate release tablet 5-10 mg  5-10 mg Oral Q4H PRN Rust-Chester, Britton L, NP   10 mg at 08/23/21 0225   pantoprazole (PROTONIX) injection 40 mg  40 mg Intravenous Q24H Rust-Chester, Britton L, NP   40 mg at 08/23/21 5809   patiromer (VELTASSA) packet 8.4 g  8.4 g Oral Daily  Rust-Chester, Huel Cote, NP       patiromer (VELTASSA) packet 8.4 g  8.4 g Oral Once [place], [place], NP       pentafluoroprop-tetrafluoroeth (GEBAUERS) aerosol 1 application  1 application Topical PRN Lateef, Munsoor, MD       polyethylene glycol (MIRALAX / GLYCOLAX) packet 17 g  17 g Oral Daily PRN Rust-Chester, Britton L, NP   17 g at 08/22/21 1344   pravastatin (PRAVACHOL) tablet 20 mg  20 mg Oral Daily Rust-Chester, Huel Cote, NP   20 mg at 08/22/21 9937   Past Medical History:  Diagnosis Date   Hypertension    Lupus ([place])    Lupus nephritis ([place])    Seizure ([place])    History reviewed. No pertinent surgical history.  Social History Social History   Tobacco Use   Smoking status: Never   Smokeless tobacco: Never  Substance Use Topics   Alcohol use: Never   Drug use: Never   Family History Family History  Problem Relation Age of Onset   Hyperlipidemia Maternal Grandmother        a. many medical problems, unknown per prior note  Denies family history of end-stage renal disease, venous disease or PAD.  Allergies  Allergen Reactions   Amlodipine Swelling   REVIEW OF SYSTEMS (Negative unless checked)  Constitutional: [] Weight loss  [] Fever  [] Chills Cardiac: [] Chest pain   [] Chest pressure    [] Palpitations   [] Shortness of breath when laying flat   [] Shortness of breath at rest   [] Shortness of breath with exertion. Vascular:  [] Pain in legs with walking   [] Pain in legs at rest   [] Pain in legs when laying flat   [] Claudication   [] Pain in feet when walking  [] Pain in feet at rest  [] Pain in feet when laying flat   [] History of DVT   [] Phlebitis   [x] Swelling in legs   [] Varicose veins   [] Non-healing ulcers Pulmonary:   [] Uses home oxygen   [] Productive cough   [] Hemoptysis   [] Wheeze  [] COPD   [] Asthma Neurologic:  [] Dizziness  [] Blackouts   [x] Seizures   [] History of stroke   [] History of TIA  [] Aphasia   [] Temporary blindness   [] Dysphagia   [] Weakness or numbness in arms   [] Weakness or numbness in legs Musculoskeletal:  [] Arthritis   [] Joint swelling   [] Joint pain   [] Low back pain Hematologic:  [] Easy bruising  [] Easy bleeding   [] Hypercoagulable state   [x] Anemic  [] Hepatitis Gastrointestinal:  [] Blood in stool   [] Vomiting blood  [] Gastroesophageal reflux/heartburn   [] Difficulty swallowing. Genitourinary:  [x] Chronic kidney disease   [] Difficult urination  [] Frequent urination  [] Burning with urination   [] Blood in urine Skin:  [] Rashes   [] Ulcers   [] Wounds Psychological:  [] History of anxiety   []  History of major depression.  Physical Examination  Vitals:   08/23/21 0500 08/23/21 0600 08/23/21 0800 08/23/21 1000  BP: (!) 144/98 (!) 162/113 (!) 177/132 (!) 148/113  Pulse: (!) 110 (!) 115 (!) 115 (!) 108  Resp: (!) 35 (!) 31 (!) 28 (!) 28  Temp:      TempSrc:      SpO2: 93% 99% 97% 95%  Weight: 70.5 kg     Height:       Body mass index is 25.09 kg/m. Gen:  WD/WN, NAD Head: [place]/AT, No temporalis wasting. Prominent temp pulse not noted. Ear/Nose/Throat: Hearing grossly intact, nares w/o erythema or drainage, oropharynx w/o Erythema/Exudate Eyes: Sclera non-icteric, conjunctiva clear  Neck: Trachea midline.  No JVD.  Pulmonary:  Good air movement, respirations not  labored, equal bilaterally.  Cardiac: RRR, normal S1, S2. Vascular:  Vessel Right Left  Radial Palpable Palpable  Ulnar Palpable Palpable   Gastrointestinal: soft, non-tender/non-distended. No guarding/reflex.  Musculoskeletal: M/S 5/5 throughout.  Extremities without ischemic changes.  No deformity or atrophy. No edema. Neurologic: Sensation grossly intact in extremities.  Symmetrical.  Speech is fluent. Motor exam as listed above. Psychiatric: Judgment intact, Mood & affect appropriate for pt's clinical situation. Dermatologic: No rashes or ulcers noted.  No cellulitis or open wounds. Lymph : No Cervical, Axillary, or Inguinal lymphadenopathy.  CBC Lab Results  Component Value Date   WBC 21.0 (H) 08/23/2021   HGB 8.1 (L) 08/23/2021   HCT 23.4 (L) 08/23/2021   MCV 83.6 08/23/2021   PLT 101 (L) 08/23/2021   BMET    Component Value Date/Time   NA 135 08/23/2021 0548   K 5.7 (H) 08/23/2021 0548   CL 106 08/23/2021 0548   CO2 18 (L) 08/23/2021 0548   GLUCOSE 142 (H) 08/23/2021 0548   BUN 89 (H) 08/23/2021 0548   CREATININE 9.11 (H) 08/23/2021 0548   CALCIUM 8.2 (L) 08/23/2021 0548   GFRNONAA 6 (L) 08/23/2021 0548   Estimated Creatinine Clearance: 9.2 mL/min (A) (by C-G formula based on SCr of 9.11 mg/dL (H)).  COAG Lab Results  Component Value Date   INR 1.0 08/21/2021   INR 1.0 12/09/2020   Radiology CT ABDOMEN PELVIS WO CONTRAST  Result Date: 08/22/2021 CLINICAL DATA:  Abdominal distension Nausea/vomiting Abdominal pain, acute, nonlocalized EXAM: CT ABDOMEN AND PELVIS WITHOUT CONTRAST TECHNIQUE: Multidetector CT imaging of the abdomen and pelvis was performed following the standard protocol without IV contrast. COMPARISON:  Chest x-ray 08/21/2021 FINDINGS: Lower chest: Marked peribronchovascular ground-glass airspace opacities of the visualized lung. At least small volume pericardial effusion partially visualized. Trace right pleural effusion. Hepatobiliary: No focal  liver abnormality. Likely question gallbladder wall thickening with markedly limited evaluation on this noncontrast study. No biliary dilatation. Query mild periportal edema. Pancreas: No focal lesion. Normal pancreatic contour. No surrounding inflammatory changes. No main pancreatic ductal dilatation. Spleen: Normal in size without focal abnormality. Adrenals/Urinary Tract: No adrenal nodule bilaterally. No nephrolithiasis and no hydronephrosis. No definite contour-deforming renal mass. No ureterolithiasis or hydroureter. The urinary bladder is unremarkable. Stomach/Bowel: Stomach is within normal limits. No evidence of bowel wall thickening or dilatation. Appendix appears normal. Vascular/Lymphatic: No abdominal aorta or iliac aneurysm. No abdominal, pelvic, or inguinal lymphadenopathy. Reproductive: Uterus and bilateral adnexa are unremarkable. Other: Trace free fluid within the abdomen and pelvis. No intraperitoneal free gas. No organized fluid collection. Musculoskeletal: No abdominal wall hernia or abnormality. No suspicious lytic or blastic osseous lesions. No acute displaced fracture. IMPRESSION: 1. Pulmonary findings suggestive of infection/inflammation versus edema versus alveolar hemorrhage. 2. Partially visualized at least small volume pericardial effusion. 3. Trace right pleural effusion. 4. Likely gallbladder wall thickening with markedly limited evaluation on this noncontrast study. Recommend right upper quadrant ultrasound for further evaluation. 5. Trace simple free fluid within the abdomen and pelvis. Electronically Signed   By: Iven Finn M.D.   On: 08/22/2021 23:59   CT HEAD WO CONTRAST (5MM)  Result Date: 08/21/2021 CLINICAL DATA:  Status post seizure. EXAM: CT HEAD WITHOUT CONTRAST TECHNIQUE: Contiguous axial images were obtained from the base of the skull through the vertex without intravenous contrast. COMPARISON:  None. FINDINGS: Brain: No evidence of acute infarction, hemorrhage,  hydrocephalus, extra-axial collection  or mass lesion/mass effect. Vascular: No hyperdense vessel or unexpected calcification. Skull: Normal. Negative for fracture or focal lesion. Sinuses/Orbits: No acute finding. Other: None. IMPRESSION: No acute intracranial pathology. Electronically Signed   By: Virgina Norfolk M.D.   On: 08/21/2021 03:49   CT Cervical Spine Wo Contrast  Result Date: 08/21/2021 CLINICAL DATA:  Status post seizure. EXAM: CT CERVICAL SPINE WITHOUT CONTRAST TECHNIQUE: Multidetector CT imaging of the cervical spine was performed without intravenous contrast. Multiplanar CT image reconstructions were also generated. COMPARISON:  None. FINDINGS: Alignment: Normal. Skull base and vertebrae: No acute fracture. No primary bone lesion or focal pathologic process. Soft tissues and spinal canal: No prevertebral fluid or swelling. No visible canal hematoma. Disc levels: Normal multilevel endplates are seen with normal multilevel intervertebral disc spaces. Normal bilateral multilevel facet joints are noted. Upper chest: Negative. Other: None. IMPRESSION: No acute fracture or subluxation of the cervical spine. Electronically Signed   By: Virgina Norfolk M.D.   On: 08/21/2021 03:51   DG Chest Portable 1 View  Result Date: 08/21/2021 CLINICAL DATA:  Initial evaluation for acute fever, seizure. EXAM: PORTABLE CHEST 1 VIEW COMPARISON:  Prior radiograph from 12/09/2020 FINDINGS: Marked cardiomegaly, stable. Mediastinal silhouette within normal limits. Lungs normally inflated. Prominent patchy airspace opacity seen throughout the right upper and lower lobes, concerning for multifocal pneumonia. Possible patchy involvement of the left lung base as well. Underlying mild perihilar vascular congestion without overt pulmonary edema. Probable trace bilateral pleural effusions. No pneumothorax. No acute osseous finding. IMPRESSION: 1. Extensive patchy airspace disease throughout the right upper and lower  lobes, concerning for multifocal pneumonia. 2. Underlying cardiomegaly with perihilar vascular congestion without overt pulmonary edema. 3. Probable trace bilateral pleural effusions. Electronically Signed   By: Jeannine Boga M.D.   On: 08/21/2021 01:18   ECHOCARDIOGRAM COMPLETE  Result Date: 08/22/2021    ECHOCARDIOGRAM REPORT   Patient Name:   [facility] Date of Exam: 08/22/2021 Medical Rec #:  [id number]    Height:       66.0 in Accession #:    [id number]   Weight:       156.3 lb Date of Birth:  11-08-2000    BSA:          1.801 m Patient Age:    20 years     BP:           149/87 mmHg Patient Gender: F            HR:           106 bpm. Exam Location:  [facility] Procedure: 2D Echo, Color Doppler and Cardiac Doppler Indications:     I42.0 Dilated cardiomyopathy  History:         Patient has prior history of Echocardiogram examinations.                  Lupus; Risk Factors:Hypertension.  Sonographer:     Charmayne Sheer Referring Phys:  [id number] BRITTON L RUST-CHESTER Diagnosing Phys: Ida Rogue MD  Sonographer Comments: Suboptimal apical window and suboptimal subcostal window. Image acquisition challenging due to uncooperative patient. Global longitudinal strain was attempted. IMPRESSIONS  1. Moderate pericardial effusion. The pericardial effusion is posterior and lateral to the left ventricle. There is no evidence of cardiac tamponade.  2. Left ventricular ejection fraction, by estimation, is 30 to 35%. The left ventricle has moderately decreased function. The left ventricle demonstrates global hypokinesis. There is mild left ventricular hypertrophy. Left ventricular diastolic parameters are consistent with  Grade II diastolic dysfunction (pseudonormalization). The average left ventricular global longitudinal strain is -5.1 %. The global longitudinal strain is abnormal.  3. Right ventricular systolic function is normal. The right ventricular size is normal. There is normal pulmonary artery systolic pressure.  The estimated right ventricular systolic pressure is 62.6 mmHg.  4. Left atrial size was moderately dilated.  5. Right atrial size was mildly dilated.  6. The mitral valve is normal in structure. Mild mitral valve regurgitation. No evidence of mitral stenosis.  7. Tricuspid valve regurgitation is moderate.  8. The inferior vena cava is normal in size with greater than 50% respiratory variability, suggesting right atrial pressure of 3 mmHg. FINDINGS  Left Ventricle: Left ventricular ejection fraction, by estimation, is 30 to 35%. The left ventricle has moderately decreased function. The left ventricle demonstrates global hypokinesis. The average left ventricular global longitudinal strain is -5.1 %.  The global longitudinal strain is abnormal. The left ventricular internal cavity size was normal in size. There is mild left ventricular hypertrophy. Left ventricular diastolic parameters are consistent with Grade II diastolic dysfunction (pseudonormalization). Right Ventricle: The right ventricular size is normal. No increase in right ventricular wall thickness. Right ventricular systolic function is normal. There is normal pulmonary artery systolic pressure. The tricuspid regurgitant velocity is 2.25 m/s, and  with an assumed right atrial pressure of 5 mmHg, the estimated right ventricular systolic pressure is 94.8 mmHg. Left Atrium: Left atrial size was moderately dilated. Right Atrium: Right atrial size was mildly dilated. Pericardium: A moderately sized pericardial effusion is present. The pericardial effusion is posterior and lateral to the left ventricle. There is no evidence of cardiac tamponade. Mitral Valve: The mitral valve is normal in structure. There is mild thickening of the mitral valve leaflet(s). There is mild calcification of the mitral valve leaflet(s). Mild mitral valve regurgitation. No evidence of mitral valve stenosis. MV peak gradient, 5.0 mmHg. The mean mitral valve gradient is 3.0 mmHg.  Tricuspid Valve: The tricuspid valve is normal in structure. Tricuspid valve regurgitation is moderate . No evidence of tricuspid stenosis. Aortic Valve: The aortic valve is normal in structure. Aortic valve regurgitation is not visualized. No aortic stenosis is present. Aortic valve mean gradient measures 2.0 mmHg. Aortic valve peak gradient measures 2.7 mmHg. Aortic valve area, by VTI measures 2.73 cm. Pulmonic Valve: The pulmonic valve was normal in structure. Pulmonic valve regurgitation is mild. No evidence of pulmonic stenosis. Aorta: The aortic root is normal in size and structure. Venous: The inferior vena cava is normal in size with greater than 50% respiratory variability, suggesting right atrial pressure of 3 mmHg. IAS/Shunts: No atrial level shunt detected by color flow Doppler.  LEFT VENTRICLE PLAX 2D LVIDd:         5.40 cm   Diastology LVIDs:         4.40 cm   LV e' medial:    7.51 cm/s LV PW:         1.30 cm   LV E/e' medial:  12.9 LV IVS:        1.10 cm   LV e' lateral:   8.81 cm/s LVOT diam:     2.10 cm   LV E/e' lateral: 11.0 LV SV:         33 LV SV Index:   18        2D Longitudinal Strain LVOT Area:     3.46 cm  2D Strain GLS Avg:     -5.1 %  RIGHT  VENTRICLE RV Basal diam:  3.70 cm RV S prime:     7.83 cm/s LEFT ATRIUM             Index        RIGHT ATRIUM           Index LA diam:        5.00 cm 2.78 cm/m   RA Area:     20.60 cm LA Vol (A2C):   30.6 ml 16.99 ml/m  RA Volume:   55.70 ml  30.93 ml/m LA Vol (A4C):   52.4 ml 29.10 ml/m LA Biplane Vol: 44.1 ml 24.49 ml/m  AORTIC VALVE                    PULMONIC VALVE AV Area (Vmax):    3.01 cm     PV Vmax:       0.70 m/s AV Area (Vmean):   2.97 cm     PV Vmean:      47.100 cm/s AV Area (VTI):     2.73 cm     PV VTI:        0.134 m AV Vmax:           81.90 cm/s   PV Peak grad:  2.0 mmHg AV Vmean:          59.800 cm/s  PV Mean grad:  1.0 mmHg AV VTI:            0.120 m AV Peak Grad:      2.7 mmHg AV Mean Grad:      2.0 mmHg LVOT Vmax:          71.10 cm/s LVOT Vmean:        51.200 cm/s LVOT VTI:          0.095 m LVOT/AV VTI ratio: 0.79  AORTA Ao Root diam: 3.20 cm MITRAL VALVE               TRICUSPID VALVE MV Area (PHT): 6.07 cm    TR Peak grad:   20.2 mmHg MV Area VTI:   2.75 cm    TR Vmax:        225.00 cm/s MV Peak grad:  5.0 mmHg MV Mean grad:  3.0 mmHg    SHUNTS MV Vmax:       1.12 m/s    Systemic VTI:  0.09 m MV Vmean:      70.6 cm/s   Systemic Diam: 2.10 cm MV Decel Time: 125 msec MV E velocity: 96.65 cm/s Ida Rogue MD Electronically signed by Ida Rogue MD Signature Date/Time: 08/22/2021/2:05:10 PM    Final    US Abdomen Limited RUQ (LIVER/GB)  Result Date: 08/23/2021 CLINICAL DATA:  History of chronic renal disease with gallbladder wall thickening seen on recent abdomen pelvis CT. EXAM: ULTRASOUND ABDOMEN LIMITED RIGHT UPPER QUADRANT COMPARISON:  Abdomen pelvis CT, dated August 22, 2021. FINDINGS: Gallbladder: A thin, curvilinear nonshadowing echogenic area is seen along the wall of the gallbladder fundus. No gallstones or wall thickening visualized (2.8 mm). No sonographic Murphy sign noted by sonographer. Common bile duct: Diameter: 4.2 mm Liver: No focal lesion identified. Within normal limits in parenchymal echogenicity. Portal vein is patent on color Doppler imaging with normal direction of blood flow towards the liver. Other: Of incidental note is the presence a right-sided pleural effusion and an echogenic right kidney. IMPRESSION: 1. Thin, echogenic area along the wall of the gallbladder fundus which may represent very mild gallbladder  wall calcification. 2. No evidence of acute cholecystitis. Further evaluation with a nuclear medicine hepatobiliary scan is recommended if this is of clinical concern. 3. Small right pleural effusion. 4. Echogenic right kidney which may be secondary to medical renal disease related to the patient's history of chronic renal disease. Electronically Signed   By: Virgina Norfolk M.D.   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  1.  Acute on Chronic Renal Failure: Patient with known history of chronic kidney disease usually [facility] by [facility]. The patient was evaluated by her nephrologist would like to initiate hemodialysis at this time.  The patient does not have an adequate dialysis access therefore is vascular surgery will place a PermCath to allow her to dialyze in both the inpatient and outpatient setting.  Procedure, risks and benefits were explained to the patient.  All questions answered.  Patient wished to proceed. We will plan on this today with Dr. Lucky Cowboy  2.  Seizure Activity: Patient had a seizure which lasted for approximately 8 minutes which prompted her to seek medical attention in emergency department. No additional seizure activity since her admission. Neurology following.  3.  Possible transfer to [facility]: Patient was accepted for transfer to [facility] (nephrologist) Dr. Matt Holmes.  Unfortunately at this time, there are no available beds.  Sela Hua, PA-C 08/23/2021 12:11 PM  This note was created with Dragon medical transcription system.  Any error is purely unintentional.

## 2021-08-23 NOTE — Consult Note (Signed)
[facility] Vascular Consult Note  MRN : [id number]  Melinda Steele is a 20 y.o. 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  She started to have a cough around Wednesday the night prior to Thanksgiving and had some general fatigue Thursday and Friday. In this setting she missed her Friday evening medications including Vimpat. Subsequently she had an 8-minute generalized tonic-clonic seizure for which EMS was activated.  She arrived to the ED in postictal state was found to be febrile to 103.8.  Initially received cefepime, vancomycin and metronidazole, now narrowed to ceftriaxone and azithromycin.  She was additionally loaded with 200 mg IV Vimpat.  She has not had any further seizures.   Work-up was concerning for multifocal pneumonia, and she required BiPAP which she has recently been taken off of but still on high flow oxygen.    Regarding her seizure history, per review of [facility] neurology notes, she had her first event in January 2022 with left gaze deviation and decerebrate posturing.  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 Her cyclophosphamide was initially stopped and then possibly restarted. The patient's mother who is active in her care does not recognize this medication and reports perhaps her cyclophosphamide was initially changed to mycophenolate subsequently changed to belimumab which is her primary immunosuppression although she also remains on Plaquenil and prednisone.  Mom reports she was initially on  Keppra 2g twice daily and that this was changed to Vimpat 150 twice daily secondary to breakthrough seizure; on chart review there are notes that she was changed secondary to her renal function.  Additionally there were some plans to do lumbar puncture to more definitively evaluate for CNS lupus but no records of this being done are available.   Regarding her cardiomyopathy, her EF was as low as 20% but on last cardiology follow-up had improved to 55 to 60% (echo 06/01/2021), and is presumed to be related to her lupus.  Nephrology has been consulted for evaluation and management of acute kidney injury in the setting of chronic kidney disease stage IV and known class IV lupus nephritis.  She has been treated with Benlysta and reports adherence with the medication.  She is regularly followed by [facility] nephrology.  Back on 04/23/2021 creatinine was 3.12 with an EGFR of 21.  However there is significant worsening in her underlying renal function now with BUN up to 88, creatinine 7.9, in EGFR down to 7.  She also appears to have significant anemia with a hemoglobin of 6.4 at the moment.  Vascular Surgery was consulted by Colon Flattery, NP for PermCath in the setting of acute on chronic renal failure.  Current Facility-Administered Medications  Medication Dose Route Frequency Provider Last Rate Last Admin   0.9 %  sodium chloride infusion  100 mL Intravenous PRN Lateef, Munsoor, MD       0.9 %  sodium chloride infusion  100 mL Intravenous PRN Lateef, Munsoor, MD       acetaminophen (TYLENOL) tablet 650 mg  650 mg Oral Q6H PRN Rust-Chester, Huel Cote, NP   650 mg at 08/22/21 2223  alteplase (CATHFLO ACTIVASE) injection 2 mg  2 mg Intracatheter Once PRN Lateef, Munsoor, MD       azithromycin (ZITHROMAX) 500 mg in sodium chloride 0.9 % 250 mL IVPB  500 mg Intravenous Q24H Oswald Hillock, RPH 250 mL/hr at 08/23/21 1209 500 mg at 08/23/21 4076   bismuth subsalicylate (PEPTO BISMOL) chewable tablet 524 mg  524 mg  Oral Q4H PRN Ottie Glazier, MD   524 mg at 08/22/21 2000   carvedilol (COREG) tablet 25 mg  25 mg Oral Daily Rust-Chester, Huel Cote, NP   25 mg at 08/22/21 0942   cefTRIAXone (ROCEPHIN) 2 g in sodium chloride 0.9 % 100 mL IVPB  2 g Intravenous Q24H Rust-Chester, Huel Cote, NP   Stopped at 08/22/21 1639   Chlorhexidine Gluconate Cloth 2 % PADS 6 each  6 each Topical Q0600 Ottie Glazier, MD   6 each at 08/21/21 0414   Chlorhexidine Gluconate Cloth 2 % PADS 6 each  6 each Topical Q0600 Holley Raring, Munsoor, MD   6 each at 08/23/21 1209   cloNIDine (CATAPRES - Dosed in mg/24 hr) patch 0.3 mg  0.3 mg Transdermal Weekly Rust-Chester, Toribio Harbour L, NP   0.3 mg at 08/22/21 2327   docusate sodium (COLACE) capsule 100 mg  100 mg Oral BID PRN Rust-Chester, Toribio Harbour L, NP   100 mg at 08/22/21 1002   heparin injection 1,000 Units  1,000 Units Dialysis PRN Holley Raring, Munsoor, MD       heparin injection 5,000 Units  5,000 Units Subcutaneous Q8H Rust-Chester, Huel Cote, NP   5,000 Units at 08/23/21 0559   hydrALAZINE (APRESOLINE) injection 10-20 mg  10-20 mg Intravenous Q4H PRN Rust-Chester, Huel Cote, NP   20 mg at 08/23/21 0106   hydroxychloroquine (PLAQUENIL) tablet 400 mg  400 mg Oral Daily Rust-Chester, Huel Cote, NP   400 mg at 08/22/21 0942   labetalol (NORMODYNE) injection 10 mg  10 mg Intravenous Q2H PRN Bradly Bienenstock, NP   10 mg at 08/23/21 8088   lacosamide (VIMPAT) 150 mg in sodium chloride 0.9 % 25 mL IVPB  150 mg Intravenous Q12H Rust-Chester, Britton L, NP 80 mL/hr at 08/23/21 1111 150 mg at 08/23/21 1111   levalbuterol (XOPENEX) nebulizer solution 1.25 mg  1.25 mg Nebulization Q6H PRN Rust-Chester, Britton L, NP       lidocaine (PF) (XYLOCAINE) 1 % injection 5 mL  5 mL Intradermal PRN Lateef, Munsoor, MD       lidocaine-prilocaine (EMLA) cream 1 application  1 application Topical PRN Lateef, Munsoor, MD       LORazepam (ATIVAN) injection 2 mg  2 mg Intravenous Q1H PRN Rust-Chester, Britton L, NP        methylPREDNISolone sodium succinate (SOLU-MEDROL) 125 mg/2 mL injection 80 mg  80 mg Intravenous Q12H Emmaline Kluver., MD   80 mg at 08/23/21 0809   NIFEdipine (PROCARDIA-XL/NIFEDICAL-XL) 24 hr tablet 90 mg  90 mg Oral Daily Dorothe Pea, RPH   90 mg at 08/22/21 1121   ondansetron (ZOFRAN) injection 4 mg  4 mg Intravenous Q6H PRN Rust-Chester, Toribio Harbour L, NP       oxyCODONE (Oxy IR/ROXICODONE) immediate release tablet 5-10 mg  5-10 mg Oral Q4H PRN Rust-Chester, Britton L, NP   10 mg at 08/23/21 0225   pantoprazole (PROTONIX) injection 40 mg  40 mg Intravenous Q24H Rust-Chester, Britton L, NP   40 mg at 08/23/21 1103   patiromer (VELTASSA) packet 8.4 g  8.4 g Oral Daily  Rust-Chester, Huel Cote, NP       patiromer (VELTASSA) packet 8.4 g  8.4 g Oral Once Garland, [place], NP       pentafluoroprop-tetrafluoroeth (GEBAUERS) aerosol 1 application  1 application Topical PRN Lateef, Munsoor, MD       polyethylene glycol (MIRALAX / GLYCOLAX) packet 17 g  17 g Oral Daily PRN Rust-Chester, Britton L, NP   17 g at 08/22/21 1344   pravastatin (PRAVACHOL) tablet 20 mg  20 mg Oral Daily Rust-Chester, Huel Cote, NP   20 mg at 08/22/21 8416   Past Medical History:  Diagnosis Date   Hypertension    Lupus ([place])    Lupus nephritis ([place])    Seizure ([place])    History reviewed. No pertinent surgical history.  Social History Social History   Tobacco Use   Smoking status: Never   Smokeless tobacco: Never  Substance Use Topics   Alcohol use: Never   Drug use: Never   Family History Family History  Problem Relation Age of Onset   Hyperlipidemia Maternal Grandmother        a. many medical problems, unknown per prior note  Denies family history of end-stage renal disease, venous disease or PAD.  Allergies  Allergen Reactions   Amlodipine Swelling   REVIEW OF SYSTEMS (Negative unless checked)  Constitutional: [] Weight loss  [] Fever  [] Chills Cardiac: [] Chest pain   [] Chest pressure    [] Palpitations   [] Shortness of breath when laying flat   [] Shortness of breath at rest   [] Shortness of breath with exertion. Vascular:  [] Pain in legs with walking   [] Pain in legs at rest   [] Pain in legs when laying flat   [] Claudication   [] Pain in feet when walking  [] Pain in feet at rest  [] Pain in feet when laying flat   [] History of DVT   [] Phlebitis   [x] Swelling in legs   [] Varicose veins   [] Non-healing ulcers Pulmonary:   [] Uses home oxygen   [] Productive cough   [] Hemoptysis   [] Wheeze  [] COPD   [] Asthma Neurologic:  [] Dizziness  [] Blackouts   [x] Seizures   [] History of stroke   [] History of TIA  [] Aphasia   [] Temporary blindness   [] Dysphagia   [] Weakness or numbness in arms   [] Weakness or numbness in legs Musculoskeletal:  [] Arthritis   [] Joint swelling   [] Joint pain   [] Low back pain Hematologic:  [] Easy bruising  [] Easy bleeding   [] Hypercoagulable state   [x] Anemic  [] Hepatitis Gastrointestinal:  [] Blood in stool   [] Vomiting blood  [] Gastroesophageal reflux/heartburn   [] Difficulty swallowing. Genitourinary:  [x] Chronic kidney disease   [] Difficult urination  [] Frequent urination  [] Burning with urination   [] Blood in urine Skin:  [] Rashes   [] Ulcers   [] Wounds Psychological:  [] History of anxiety   []  History of major depression.  Physical Examination  Vitals:   08/23/21 0500 08/23/21 0600 08/23/21 0800 08/23/21 1000  BP: (!) 144/98 (!) 162/113 (!) 177/132 (!) 148/113  Pulse: (!) 110 (!) 115 (!) 115 (!) 108  Resp: (!) 35 (!) 31 (!) 28 (!) 28  Temp:      TempSrc:      SpO2: 93% 99% 97% 95%  Weight: 70.5 kg     Height:       Body mass index is 25.09 kg/m. Gen:  WD/WN, NAD Head: [place]/AT, No temporalis wasting. Prominent temp pulse not noted. Ear/Nose/Throat: Hearing grossly intact, nares w/o erythema or drainage, oropharynx w/o Erythema/Exudate Eyes: Sclera non-icteric, conjunctiva clear  Neck: Trachea midline.  No JVD.  Pulmonary:  Good air movement, respirations not  labored, equal bilaterally.  Cardiac: RRR, normal S1, S2. Vascular:  Vessel Right Left  Radial Palpable Palpable  Ulnar Palpable Palpable   Gastrointestinal: soft, non-tender/non-distended. No guarding/reflex.  Musculoskeletal: M/S 5/5 throughout.  Extremities without ischemic changes.  No deformity or atrophy. No edema. Neurologic: Sensation grossly intact in extremities.  Symmetrical.  Speech is fluent. Motor exam as listed above. Psychiatric: Judgment intact, Mood & affect appropriate for pt's clinical situation. Dermatologic: No rashes or ulcers noted.  No cellulitis or open wounds. Lymph : No Cervical, Axillary, or Inguinal lymphadenopathy.  CBC Lab Results  Component Value Date   WBC 21.0 (H) 08/23/2021   HGB 8.1 (L) 08/23/2021   HCT 23.4 (L) 08/23/2021   MCV 83.6 08/23/2021   PLT 101 (L) 08/23/2021   BMET    Component Value Date/Time   NA 135 08/23/2021 0548   K 5.7 (H) 08/23/2021 0548   CL 106 08/23/2021 0548   CO2 18 (L) 08/23/2021 0548   GLUCOSE 142 (H) 08/23/2021 0548   BUN 89 (H) 08/23/2021 0548   CREATININE 9.11 (H) 08/23/2021 0548   CALCIUM 8.2 (L) 08/23/2021 0548   GFRNONAA 6 (L) 08/23/2021 0548   Estimated Creatinine Clearance: 9.2 mL/min (A) (by C-G formula based on SCr of 9.11 mg/dL (H)).  COAG Lab Results  Component Value Date   INR 1.0 08/21/2021   INR 1.0 12/09/2020   Radiology CT ABDOMEN PELVIS WO CONTRAST  Result Date: 08/22/2021 CLINICAL DATA:  Abdominal distension Nausea/vomiting Abdominal pain, acute, nonlocalized EXAM: CT ABDOMEN AND PELVIS WITHOUT CONTRAST TECHNIQUE: Multidetector CT imaging of the abdomen and pelvis was performed following the standard protocol without IV contrast. COMPARISON:  Chest x-ray 08/21/2021 FINDINGS: Lower chest: Marked peribronchovascular ground-glass airspace opacities of the visualized lung. At least small volume pericardial effusion partially visualized. Trace right pleural effusion. Hepatobiliary: No focal  liver abnormality. Likely question gallbladder wall thickening with markedly limited evaluation on this noncontrast study. No biliary dilatation. Query mild periportal edema. Pancreas: No focal lesion. Normal pancreatic contour. No surrounding inflammatory changes. No main pancreatic ductal dilatation. Spleen: Normal in size without focal abnormality. Adrenals/Urinary Tract: No adrenal nodule bilaterally. No nephrolithiasis and no hydronephrosis. No definite contour-deforming renal mass. No ureterolithiasis or hydroureter. The urinary bladder is unremarkable. Stomach/Bowel: Stomach is within normal limits. No evidence of bowel wall thickening or dilatation. Appendix appears normal. Vascular/Lymphatic: No abdominal aorta or iliac aneurysm. No abdominal, pelvic, or inguinal lymphadenopathy. Reproductive: Uterus and bilateral adnexa are unremarkable. Other: Trace free fluid within the abdomen and pelvis. No intraperitoneal free gas. No organized fluid collection. Musculoskeletal: No abdominal wall hernia or abnormality. No suspicious lytic or blastic osseous lesions. No acute displaced fracture. IMPRESSION: 1. Pulmonary findings suggestive of infection/inflammation versus edema versus alveolar hemorrhage. 2. Partially visualized at least small volume pericardial effusion. 3. Trace right pleural effusion. 4. Likely gallbladder wall thickening with markedly limited evaluation on this noncontrast study. Recommend right upper quadrant ultrasound for further evaluation. 5. Trace simple free fluid within the abdomen and pelvis. Electronically Signed   By: Iven Finn M.D.   On: 08/22/2021 23:59   CT HEAD WO CONTRAST (5MM)  Result Date: 08/21/2021 CLINICAL DATA:  Status post seizure. EXAM: CT HEAD WITHOUT CONTRAST TECHNIQUE: Contiguous axial images were obtained from the base of the skull through the vertex without intravenous contrast. COMPARISON:  None. FINDINGS: Brain: No evidence of acute infarction, hemorrhage,  hydrocephalus, extra-axial collection  or mass lesion/mass effect. Vascular: No hyperdense vessel or unexpected calcification. Skull: Normal. Negative for fracture or focal lesion. Sinuses/Orbits: No acute finding. Other: None. IMPRESSION: No acute intracranial pathology. Electronically Signed   By: Virgina Norfolk M.D.   On: 08/21/2021 03:49   CT Cervical Spine Wo Contrast  Result Date: 08/21/2021 CLINICAL DATA:  Status post seizure. EXAM: CT CERVICAL SPINE WITHOUT CONTRAST TECHNIQUE: Multidetector CT imaging of the cervical spine was performed without intravenous contrast. Multiplanar CT image reconstructions were also generated. COMPARISON:  None. FINDINGS: Alignment: Normal. Skull base and vertebrae: No acute fracture. No primary bone lesion or focal pathologic process. Soft tissues and spinal canal: No prevertebral fluid or swelling. No visible canal hematoma. Disc levels: Normal multilevel endplates are seen with normal multilevel intervertebral disc spaces. Normal bilateral multilevel facet joints are noted. Upper chest: Negative. Other: None. IMPRESSION: No acute fracture or subluxation of the cervical spine. Electronically Signed   By: Virgina Norfolk M.D.   On: 08/21/2021 03:51   DG Chest Portable 1 View  Result Date: 08/21/2021 CLINICAL DATA:  Initial evaluation for acute fever, seizure. EXAM: PORTABLE CHEST 1 VIEW COMPARISON:  Prior radiograph from 12/09/2020 FINDINGS: Marked cardiomegaly, stable. Mediastinal silhouette within normal limits. Lungs normally inflated. Prominent patchy airspace opacity seen throughout the right upper and lower lobes, concerning for multifocal pneumonia. Possible patchy involvement of the left lung base as well. Underlying mild perihilar vascular congestion without overt pulmonary edema. Probable trace bilateral pleural effusions. No pneumothorax. No acute osseous finding. IMPRESSION: 1. Extensive patchy airspace disease throughout the right upper and lower  lobes, concerning for multifocal pneumonia. 2. Underlying cardiomegaly with perihilar vascular congestion without overt pulmonary edema. 3. Probable trace bilateral pleural effusions. Electronically Signed   By: Jeannine Boga M.D.   On: 08/21/2021 01:18   ECHOCARDIOGRAM COMPLETE  Result Date: 08/22/2021    ECHOCARDIOGRAM REPORT   Patient Name:   Melinda Steele Date of Exam: 08/22/2021 Medical Rec #:  [id number]    Height:       66.0 in Accession #:    [id number]   Weight:       156.3 lb Date of Birth:  01/17/2001    BSA:          1.801 m Patient Age:    20 years     BP:           149/87 mmHg Patient Gender: F            HR:           106 bpm. Exam Location:  [facility] Procedure: 2D Echo, Color Doppler and Cardiac Doppler Indications:     I42.0 Dilated cardiomyopathy  History:         Patient has prior history of Echocardiogram examinations.                  Lupus; Risk Factors:Hypertension.  Sonographer:     Charmayne Sheer Referring Phys:  [id number] BRITTON L RUST-CHESTER Diagnosing Phys: Ida Rogue MD  Sonographer Comments: Suboptimal apical window and suboptimal subcostal window. Image acquisition challenging due to uncooperative patient. Global longitudinal strain was attempted. IMPRESSIONS  1. Moderate pericardial effusion. The pericardial effusion is posterior and lateral to the left ventricle. There is no evidence of cardiac tamponade.  2. Left ventricular ejection fraction, by estimation, is 30 to 35%. The left ventricle has moderately decreased function. The left ventricle demonstrates global hypokinesis. There is mild left ventricular hypertrophy. Left ventricular diastolic parameters are consistent with  Grade II diastolic dysfunction (pseudonormalization). The average left ventricular global longitudinal strain is -5.1 %. The global longitudinal strain is abnormal.  3. Right ventricular systolic function is normal. The right ventricular size is normal. There is normal pulmonary artery systolic pressure.  The estimated right ventricular systolic pressure is 93.7 mmHg.  4. Left atrial size was moderately dilated.  5. Right atrial size was mildly dilated.  6. The mitral valve is normal in structure. Mild mitral valve regurgitation. No evidence of mitral stenosis.  7. Tricuspid valve regurgitation is moderate.  8. The inferior vena cava is normal in size with greater than 50% respiratory variability, suggesting right atrial pressure of 3 mmHg. FINDINGS  Left Ventricle: Left ventricular ejection fraction, by estimation, is 30 to 35%. The left ventricle has moderately decreased function. The left ventricle demonstrates global hypokinesis. The average left ventricular global longitudinal strain is -5.1 %.  The global longitudinal strain is abnormal. The left ventricular internal cavity size was normal in size. There is mild left ventricular hypertrophy. Left ventricular diastolic parameters are consistent with Grade II diastolic dysfunction (pseudonormalization). Right Ventricle: The right ventricular size is normal. No increase in right ventricular wall thickness. Right ventricular systolic function is normal. There is normal pulmonary artery systolic pressure. The tricuspid regurgitant velocity is 2.25 m/s, and  with an assumed right atrial pressure of 5 mmHg, the estimated right ventricular systolic pressure is 90.2 mmHg. Left Atrium: Left atrial size was moderately dilated. Right Atrium: Right atrial size was mildly dilated. Pericardium: A moderately sized pericardial effusion is present. The pericardial effusion is posterior and lateral to the left ventricle. There is no evidence of cardiac tamponade. Mitral Valve: The mitral valve is normal in structure. There is mild thickening of the mitral valve leaflet(s). There is mild calcification of the mitral valve leaflet(s). Mild mitral valve regurgitation. No evidence of mitral valve stenosis. MV peak gradient, 5.0 mmHg. The mean mitral valve gradient is 3.0 mmHg.  Tricuspid Valve: The tricuspid valve is normal in structure. Tricuspid valve regurgitation is moderate . No evidence of tricuspid stenosis. Aortic Valve: The aortic valve is normal in structure. Aortic valve regurgitation is not visualized. No aortic stenosis is present. Aortic valve mean gradient measures 2.0 mmHg. Aortic valve peak gradient measures 2.7 mmHg. Aortic valve area, by VTI measures 2.73 cm. Pulmonic Valve: The pulmonic valve was normal in structure. Pulmonic valve regurgitation is mild. No evidence of pulmonic stenosis. Aorta: The aortic root is normal in size and structure. Venous: The inferior vena cava is normal in size with greater than 50% respiratory variability, suggesting right atrial pressure of 3 mmHg. IAS/Shunts: No atrial level shunt detected by color flow Doppler.  LEFT VENTRICLE PLAX 2D LVIDd:         5.40 cm   Diastology LVIDs:         4.40 cm   LV e' medial:    7.51 cm/s LV PW:         1.30 cm   LV E/e' medial:  12.9 LV IVS:        1.10 cm   LV e' lateral:   8.81 cm/s LVOT diam:     2.10 cm   LV E/e' lateral: 11.0 LV SV:         33 LV SV Index:   18        2D Longitudinal Strain LVOT Area:     3.46 cm  2D Strain GLS Avg:     -5.1 %  RIGHT  VENTRICLE RV Basal diam:  3.70 cm RV S prime:     7.83 cm/s LEFT ATRIUM             Index        RIGHT ATRIUM           Index LA diam:        5.00 cm 2.78 cm/m   RA Area:     20.60 cm LA Vol (A2C):   30.6 ml 16.99 ml/m  RA Volume:   55.70 ml  30.93 ml/m LA Vol (A4C):   52.4 ml 29.10 ml/m LA Biplane Vol: 44.1 ml 24.49 ml/m  AORTIC VALVE                    PULMONIC VALVE AV Area (Vmax):    3.01 cm     PV Vmax:       0.70 m/s AV Area (Vmean):   2.97 cm     PV Vmean:      47.100 cm/s AV Area (VTI):     2.73 cm     PV VTI:        0.134 m AV Vmax:           81.90 cm/s   PV Peak grad:  2.0 mmHg AV Vmean:          59.800 cm/s  PV Mean grad:  1.0 mmHg AV VTI:            0.120 m AV Peak Grad:      2.7 mmHg AV Mean Grad:      2.0 mmHg LVOT Vmax:          71.10 cm/s LVOT Vmean:        51.200 cm/s LVOT VTI:          0.095 m LVOT/AV VTI ratio: 0.79  AORTA Ao Root diam: 3.20 cm MITRAL VALVE               TRICUSPID VALVE MV Area (PHT): 6.07 cm    TR Peak grad:   20.2 mmHg MV Area VTI:   2.75 cm    TR Vmax:        225.00 cm/s MV Peak grad:  5.0 mmHg MV Mean grad:  3.0 mmHg    SHUNTS MV Vmax:       1.12 m/s    Systemic VTI:  0.09 m MV Vmean:      70.6 cm/s   Systemic Diam: 2.10 cm MV Decel Time: 125 msec MV E velocity: 96.65 cm/s Ida Rogue MD Electronically signed by Ida Rogue MD Signature Date/Time: 08/22/2021/2:05:10 PM    Final    US Abdomen Limited RUQ (LIVER/GB)  Result Date: 08/23/2021 CLINICAL DATA:  History of chronic renal disease with gallbladder wall thickening seen on recent abdomen pelvis CT. EXAM: ULTRASOUND ABDOMEN LIMITED RIGHT UPPER QUADRANT COMPARISON:  Abdomen pelvis CT, dated August 22, 2021. FINDINGS: Gallbladder: A thin, curvilinear nonshadowing echogenic area is seen along the wall of the gallbladder fundus. No gallstones or wall thickening visualized (2.8 mm). No sonographic Murphy sign noted by sonographer. Common bile duct: Diameter: 4.2 mm Liver: No focal lesion identified. Within normal limits in parenchymal echogenicity. Portal vein is patent on color Doppler imaging with normal direction of blood flow towards the liver. Other: Of incidental note is the presence a right-sided pleural effusion and an echogenic right kidney. IMPRESSION: 1. Thin, echogenic area along the wall of the gallbladder fundus which may represent very mild gallbladder  wall calcification. 2. No evidence of acute cholecystitis. Further evaluation with a nuclear medicine hepatobiliary scan is recommended if this is of clinical concern. 3. Small right pleural effusion. 4. Echogenic right kidney which may be secondary to medical renal disease related to the patient's history of chronic renal disease. Electronically Signed   By: Virgina Norfolk M.D.   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  1.  Acute on Chronic Renal Failure: Patient with known history of chronic kidney disease usually followed by [facility]. The patient was evaluated by her nephrologist would like to initiate hemodialysis at this time.  The patient does not have an adequate dialysis access therefore is vascular surgery will place a PermCath to allow her to dialyze in both the inpatient and outpatient setting.  Procedure, risks and benefits were explained to the patient.  All questions answered.  Patient wished to proceed. We will plan on this today with Dr. Lucky Cowboy  2.  Seizure Activity: Patient had a seizure which lasted for approximately 8 minutes which prompted her to seek medical attention in emergency department. No additional seizure activity since her admission. Neurology following.  3.  Possible transfer to [facility]: Patient was accepted for transfer to [facility] (nephrologist) Dr. Matt Holmes.  Unfortunately at this time, there are no available beds.  Sela Hua, PA-C 08/23/2021 12:11 PM  This note was created with Dragon medical transcription system.  Any error is purely unintentional.

## 2021-08-23 NOTE — Interval H&P Note (Signed)
History and Physical Interval Note:  08/23/2021 2:23 PM  Melinda [facility]  has presented today for surgery, with the diagnosis of ESRD.  The various methods of treatment have been discussed with the patient and family. After consideration of risks, benefits and other options for treatment, the patient has consented to  Procedure(s): DIALYSIS/PERMA CATHETER INSERTION (N/A) as a surgical intervention.  The patient's history has been reviewed, patient examined, no change in status, stable for surgery.  I have reviewed the patient's chart and labs.  Questions were answered to the patient's satisfaction.     Leotis Pain

## 2021-08-23 NOTE — Plan of Care (Signed)

## 2021-08-23 NOTE — Progress Notes (Signed)
NAME:  Melinda [facility], MRN:  [id number], DOB:  [birth year], LOS: 2 ADMISSION DATE:  08/21/2021, CONSULTATION DATE:  08/21/2021 REFERRING MD:  Dr. Leonides Schanz, CHIEF COMPLAINT:  Seizures   History of Present Illness:  20 year old female presenting to [facility] ED from home via EMS on 08/21/2021 after reports of an 8 minute seizure at home.  Per ED documentation EMS reported the patient was tachycardic hypertensive and postictal upon arrival.  CBG check was in the 130's.  History provided by mother and friend at bedside, the patient's friend stated he was on the phone with her when she became unresponsive and sounded as though she was " gasping and choking".  The patient's mother woke up around the same time because she her her daughter gasping and fall out of bed.  Upon arrival to the patient's room she was found on the floor with what the mother is reporting as tonic-clonic seizures: Eyes rolled back into her head with all extremities stiffened.  Per the patient's report the last thing she remembers is vomiting.  Patient also admits to feeling generalized fatigue for the last 2 to 3 days but denies any localized symptoms.  She denies nausea/vomiting/diarrhea/abdominal pain, dysuria/polyuria, shortness of breath/productive cough, fever/chills, poor p.o. intake, weight gain/increased swelling, orthopnea and patient denies any pain at this time. ED course: Per EDP patient appeared post ictal on arrival, now oriented & moving all extremities. Due to breakthrough seizure while on Vimpat, EDP discussed the case with Pharmacist and a loading dose was given IV. Patient was switched to Vimpat from [place] due to breakthrough seizures. Patient met criteria for Sepsis workup and protocol initiated with only 1 L of IVF given due to BNP > 4500. Patient was transitioned to [facility] because of hypoxia. Medications given: Cefepime/Flagyl/Vancomycin, 1 L NS bolus, Vimpat 200 mg IV, acetaminophen 650 mg  Initial Vitals: febrile-103.9,  tachypneic-42, tachycardic-140, hypertensive-165/115 & hypoxic with SpO2 85% on 4 L [facility] Significant labs: (Labs/ Imaging personally reviewed) I, Domingo Pulse [place], AGACNP-BC, personally viewed and interpreted this ECG. EKG Interpretation: Date: 08/21/21, EKG Time: 00:49, Rate: 138, Rhythm: ST,  QRS Axis:  normal, Intervals: RSR in V1-V2 (unchanged from previous EKG), ST/T Wave abnormalities: non specific T wave inversions, Narrative Interpretation: ST Chemistry: Na+:137, K+: 5.1, BUN/Cr.: 86/ 7.89, Serum CO2/ AG: 17 /10 Hematology: WBC: 20.1, Hgb: 7.4,  Troponin: 538, BNP: >4,500, Lactic/ PCT: 2.5/ 32.25,  COVID-19 & Influenza A/B: negative ABG: 7.43/ 25/ 111/ 16.6 CXR 08/21/2021: Extensive patchy airspace disease throughout RUL & RLL, concerning for multifocal pneumonia. Underlying cardiomegaly with perihilar congestion without overt edema, trace bilateral pleural effusions.  PCCM consulted for admission due to complexity of patient & high risk for intubation.  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 Extensive patchy airspace disease throughout the right upper and lower lobes, concerning for multifocal pneumonia. 2. Underlying cardiomegaly with perihilar vascular congestion without overt pulmonary edema. 3. Probable trace  bilateral pleural effusions. 08/21/2021: CT head without contrast>>No acute intracranial pathology. 08/21/2021: CT cervical  spine>>No acute fracture or subluxation of the cervical spine 08/22/2021: CT abdomen and pelvis without contrast>>IMPRESSION: 1. Pulmonary findings suggestive of infection/inflammation versus edema versus alveolar hemorrhage. 2. Partially visualized at least small volume pericardial effusion. 3. Trace right pleural effusion. 4. Likely gallbladder wall thickening with markedly limited evaluation on this noncontrast study. Recommend right upper quadrant ultrasound for further evaluation. 5. Trace simple free fluid within the abdomen and pelvis 08/23/2021: Right upper quadrant abdominal ultrasound>>IMPRESSION: 1. Thin, echogenic area along the wall of the gallbladder fundus which may represent very mild gallbladder wall calcification. 2. No evidence of acute cholecystitis. Further evaluation with a nuclear medicine hepatobiliary scan is recommended if this is of clinical concern. 3. Small right pleural effusion. 4. Echogenic right kidney which may be secondary to medical renal disease related to the patient's history of chronic renal disease. 08/22/21: Echocardiogram>>  1. Moderate pericardial effusion. The pericardial effusion is posterior  and lateral to the left ventricle. There is no evidence of cardiac  tamponade.   2. Left ventricular ejection fraction, by estimation, is 30 to 35%. The  left ventricle has moderately decreased function. The left ventricle  demonstrates global hypokinesis. There is mild left ventricular  hypertrophy. Left ventricular diastolic  parameters are consistent with Grade II diastolic dysfunction  (pseudonormalization). The average left ventricular global longitudinal  strain is -5.1 %. The global longitudinal strain is abnormal.   3. Right ventricular systolic function is normal. The right ventricular  size is normal. There is normal  pulmonary artery systolic pressure. The  estimated right ventricular systolic pressure is 63.0 mmHg.   4. Left atrial size was moderately dilated.   5. Right atrial size was mildly dilated.   6. The mitral valve is normal in structure. Mild mitral valve  regurgitation. No evidence of mitral stenosis.   7. Tricuspid valve regurgitation is moderate.   8. The inferior vena cava is normal in size with greater than 50%  respiratory variability, suggesting right atrial pressure of 3 mmHg.   Significant Hospital Events: Including procedures, antibiotic start and stop dates in addition to other pertinent events   08/21/21- patient admitted to ICU with Acute hypoxic respiratory failure on HHFNC, Sepsis s/t suspected aspiration pneumonia & acute renal failure on CKD. 08/22/21- Patient clinically improved, she is now on room air off BIPAP.  Mother has requested transfer to [facility] has accepted pt for transfer but waiting on bed availability.  Patient is to have blood transfusion today. She is improved to step down unit.  Rheumatology, Cardiology, and nephrology following with plan for possible HD.  08/23/21- Plan for PermCath placement by Vascular Surgery, followed by initiation of HD. Hgb improved to 8.1 s/p 2 units of pRBC transfusion yesterday. Transfer to [facility] pending bed availability  Interim History / Subjective:  -Overnight pt complained of Nausea, had 2 large loose stool, generalized abdominal pain & distention ~ CT Abdomen showed gallbladder wall thickening ~ RUQ showed mild gallbladder wall calcification but NO evidence of Acute Cholecystitis ~ was placed on Flagyl -This morning pt denies abdominal pain, reports abdomen is feeling much better, no further diarrhea ~ GI panel to be collected if further diarrhea -Afebrile, hemodynamically stable, on room air, NO vasopressors -Persistent Hyperkalemia, K+ 5.7 (5.8), Creatinine worsened to 9.11 ~ plan for Permcath placement today by Vascular Surgery and  initiation of Hemodialysis -Hgb improved to 8.1 s/p 2 units of pRBC's yesterday -WBC improved to 21 K (32 K)  Objective   Blood pressure (!) 162/113, pulse Marland Kitchen)  115, temperature 98.6 F (37 C), temperature source Oral, resp. rate (!) 31, height 5' 6"  (1.676 m), weight 70.5 kg, SpO2 99 %.        Intake/Output Summary (Last 24 hours) at 08/23/2021 0857 Last data filed at 08/23/2021 0600 Gross per 24 hour  Intake 1537.38 ml  Output --  Net 1537.38 ml   Filed Weights   08/21/21 0058 08/22/21 0500 08/23/21 0500  Weight: 72.6 kg 70.9 kg 70.5 kg    Examination: General: Adult female, acutely ill appearing, sitting in bed on Room air, NAD HEENT: MM pink/moist, anicteric, atraumatic, neck supple Neuro: A&O x 4, able to follow commands, PERRL +3, MAE CV: s1s2 Tachycardia, regular rhythm (ST on monitor) no r/m/g Pulm: Regular, non labored, breath sounds clear throughout, no rales or wheezing noted GI: soft, rounded, non tender, bs x 4 Skin: limited exam- no rashes/lesions noted Extremities: warm/dry, pulses + 2 R/P, no edema noted  Resolved Hospital Problem list     Assessment & Plan:  Sepsis without septic shock due to suspected Aspiration Pneumonia vs CAP Patient vomited during seizure- however reports feeling rundown over the last 2-3 days without any other localized symptoms -Monitor fever curve -Trend WBC's & Procalcitonin -Follow cultures as above -Continue empiric Azithromycin, Ceftriaxone, Flagyl pending cultures & sensitivities  Acute Hypoxic Respiratory Failure secondary to Pneumonia in the setting of Acute Seizure & Acute on chronic HFrEF - Supplemental O2 to maintain SpO2 > 90% - Intermittent chest x-ray & ABG PRN - Ensure adequate pulmonary hygiene  - F/u cultures, trend PCT - Continue CAP/Aspiration Pna coverage: ceftriaxone & vancomycin - bronchodilators PRN -Volume removal with dialysis  Acute Renal Failure superimposed on CKD Stage 3b in the setting of Class  IV glomerulonephritis s/t SLE NAGMA SLE PMHx: Nephrotic Syndrome Baseline Cr: 2.5-3.1 (July/22 labs), Cr on admission:7.89 Benlysta injection on hold while we are treating for an active  -Monitor I&O's / urinary output -Follow BMP -Ensure adequate renal perfusion -Avoid nephrotoxic agents as able -Replace electrolytes as indicated -Nephrology following, appreciate input -Plan for Permcath placement followed by initiation of HD -Continue Veltassa  Systemic Lupus Erythematous with recent flare (Manifestations of worsening Renal failure with proteinuria, anemia, breakthrough seizures, Cardiomyopathy) Prior Cytoxan, CellCept, Benlysta, Rituximab, Chronic Steroids -Rheumatology following, appreciate input -Continue IV Solumedrol 80 mg BID (dose increased on 11/27 from 40 mg daily) -Holding Cytoxan for now -Awaiting transfer to [facility]  Acute Seizure secondary to medication noncompliance in the setting chronic seizure disorder Patient had reported breakthrough seizures while on Keppra and was changed to Vimpat. She received 200 mg loading dose of Vimpat IV once in ED. Pt reports last dose was in the AM on 11/25 so she admitted to missing her evening dose last night. - continue outpatient Vimpat 150 mg BID - Ativan 2 mg PRN for breakthrough seizure activity - neuro checks Q 4 h - seizure precautions - Neurology following, appreciate input ~ recommends outpatient Neurology follow up upon discharge  Acute on Chronic HFrEF exacerbation (LVEF 30-35%) Moderate Pericardial Effusion Elevated Troponin secondary to N-STEMI vs demand ischemia Hyertension HLD PMHx: HFrEF (previous LVEF 20%), Chronic pericardial effusion 9/22 ECHO: showed improved LVEF >55% & improved small pericardial effusion Troponins: 538 BNP: >4500 - Continuous cardiac monitoring - continue Carvedilol, Clonidine, Nifedipine, pravastatin -PRN Labetalol for SBP >170 or DBP >100 -Cardiology following, appreciate input -  Echocardiogram 11/27: Moderate Pericardial effusion, no evidence of cardiac tamponade, LVEF 30-35%, Grade II diastolic dysfunction, RV systolic function normal - Trend HS  troponin until peaked (538 ~ 619 ~ 645)  - Daily weights to assess volume status - Volume removal with HD  Chronic Anemia in the setting of Chronic Disease PMHx: Hemolytic Anemia Hgb: 7.4 which appears to be around baseline -Monitor for S/Sx of bleeding -Trend CBC -Heparin SQ for VTE Prophylaxis  -Transfuse for Hgb <7 -S/p 2 units of pRBC's on 11/27   Best Practice (right click and "Reselect all SmartList Selections" daily)  Diet/type: NPO w/ oral meds DVT prophylaxis: prophylactic heparin  GI prophylaxis: PPI Lines: N/A Foley:  N/A Code Status:  full code Last date of multidisciplinary goals of care discussion [08/23/21]  Updated pt and her mother at bedside 08/23/21.  [facility] has accepted pt for transfer, but awaiting bed availability.  Labs   CBC: Recent Labs  Lab 08/21/21 0100 08/21/21 0531 08/21/21 0957 08/22/21 0409 08/22/21 1626 08/23/21 0548  WBC 20.1* 13.6*  --  32.8*  --  21.0*  NEUTROABS 18.5*  --   --   --   --   --   HGB 7.4* 6.3* 6.4* 5.9* 7.8* 8.1*  HCT 23.4* 20.1* 19.9* 18.2* 23.2* 23.4*  MCV 87.6 87.0  --  86.7  --  83.6  PLT 188 128*  --  120*  --  101*     Basic Metabolic Panel: Recent Labs  Lab 08/21/21 0100 08/21/21 0531 08/22/21 0409 08/22/21 2008 08/23/21 0548  NA 137 139 137  --  135  K 5.1 5.1 6.1* 5.8* 5.7*  CL 110 112* 106  --  106  CO2 17* 19* 20*  --  18*  GLUCOSE 80 157* 133*  --  142*  BUN 86* 88* 97*  --  89*  CREATININE 7.89* 7.90* 8.78*  --  9.11*  CALCIUM 7.7* 7.3* 7.7*  --  8.2*  MG  --  1.7 2.9*  --  2.7*  PHOS  --  4.4 5.8*  --  6.7*    GFR: Estimated Creatinine Clearance: 9.2 mL/min (A) (by C-G formula based on SCr of 9.11 mg/dL (H)). Recent Labs  Lab 08/21/21 0100 08/21/21 0102 08/21/21 0531 08/22/21 0409 08/23/21 0548  PROCALCITON 32.25   --  >150.00 >150.00  --   WBC 20.1*  --  13.6* 32.8* 21.0*  LATICACIDVEN  --  2.5* 1.7  --   --      Liver Function Tests: Recent Labs  Lab 08/21/21 0100 08/23/21 0548  AST 20 22  ALT 15 17  ALKPHOS 43 48  BILITOT 0.6 0.9  PROT 6.2* 6.1*  ALBUMIN 3.2* 2.7*    No results for input(s): LIPASE, AMYLASE in the last 168 hours. No results for input(s): AMMONIA in the last 168 hours.  ABG    Component Value Date/Time   PHART 7.41 08/21/2021 0829   PCO2ART 28 (L) 08/21/2021 0829   PO2ART 137 (H) 08/21/2021 0829   HCO3 17.7 (L) 08/21/2021 0829   ACIDBASEDEF 6.1 (H) 08/21/2021 0829   O2SAT 99.1 08/21/2021 0829      Coagulation Profile: Recent Labs  Lab 08/21/21 0101  INR 1.0     Cardiac Enzymes: Recent Labs  Lab 08/21/21 0957  CKTOTAL 75    HbA1C: No results found for: HGBA1C  CBG: Recent Labs  Lab 08/22/21 1509 08/22/21 1925 08/23/21 0052 08/23/21 0336 08/23/21 0720  GLUCAP 111* 116* 140* 133* 122*     Review of Systems: Positives in BOLD  Gen: Denies fever, chills, weight change, fatigue, night sweats HEENT: Denies blurred vision,  double vision, hearing loss, tinnitus, sinus congestion, rhinorrhea, sore throat, neck stiffness, dysphagia PULM: Denies shortness of breath, cough, sputum production, hemoptysis, wheezing CV: Denies chest pain, edema, orthopnea, paroxysmal nocturnal dyspnea, palpitations GI: Denies abdominal pain, nausea, vomiting, diarrhea, hematochezia, melena, constipation, change in bowel habits GU: Denies dysuria, hematuria, polyuria, oliguria, urethral discharge Endocrine: Denies hot or cold intolerance, polyuria, polyphagia or appetite change Derm: Denies rash, dry skin, scaling or peeling skin change Heme: Denies easy bruising, bleeding, bleeding gums Neuro: Denies headache, numbness, weakness, slurred speech, loss of memory or consciousness  Past Medical History:  She,  has a past medical history of Hypertension, Lupus ([place]),  Lupus nephritis ([place]), and Seizure ([place]).   Surgical History:  History reviewed. No pertinent surgical history.   Social History:   reports that she has never smoked. She has never used smokeless tobacco. She reports that she does not drink alcohol and does not use drugs.   Family History:  Her family history includes Hyperlipidemia in her maternal grandmother.   Allergies Allergies  Allergen Reactions   Amlodipine Swelling     Home Medications  Prior to Admission medications   Medication Sig Start Date End Date Taking? Authorizing Provider  Ampicillin-Sulbactam 3 g in sodium chloride 0.9 % 100 mL Inject 3 g into the vein every 6 (six) hours. 12/10/20   Flora Lipps, MD  cloNIDine (CATAPRES - DOSED IN MG/24 HR) 0.3 mg/24hr patch Place 1 patch (0.3 mg total) onto the skin once a week. 12/10/20   Flora Lipps, MD     Critical care time: 40 minutes    Darel Hong, AGACNP-BC [facility] Prefer epic messenger for cross cover needs If after hours, please call E-link

## 2021-08-23 NOTE — Progress Notes (Signed)
[facility]  ROUNDING NOTE   Subjective:  Patient seen and evaluated at bedside. More awake and conversant today. Due for permcath placement today, and 1st dialysis treatment thereafter.    Objective:  Vital signs in last 24 hours:  Temp:  [98.3 F (36.8 C)-99.2 F (37.3 C)] 98.6 F (37 C) (11/27 1953) Pulse Rate:  [79-126] 115 (11/28 0600) Resp:  [13-35] 31 (11/28 0600) BP: (122-173)/(85-127) 162/113 (11/28 0600) SpO2:  [90 %-100 %] 99 % (11/28 0600) Weight:  [70.5 kg] 70.5 kg (11/28 0500)  Weight change: -0.4 kg Filed Weights   08/21/21 0058 08/22/21 0500 08/23/21 0500  Weight: 72.6 kg 70.9 kg 70.5 kg    Intake/Output: I/O last 3 completed shifts: In: 1977.4 [I.V.:850.7; Blood:738.3; IV Piggyback:388.4] Out: -    Intake/Output this shift:  No intake/output data recorded.  Physical Exam: General: No acute distress  Head: Normocephalic, atraumatic. Moist oral mucosal membranes  Eyes: Anicteric  Neck: Supple  Lungs:  Clear to auscultation, normal effort  Heart: S1S2 no rubs  Abdomen:  Soft, nontender, bowel sounds present  Extremities: No peripheral edema.  Neurologic: Awake, alert, conversant.  Skin: No acute rash  Access: No hemodialysis access    Basic Metabolic Panel: Recent Labs  Lab 08/21/21 0100 08/21/21 0531 08/22/21 0409 08/22/21 2008 08/23/21 0548  NA 137 139 137  --  135  K 5.1 5.1 6.1* 5.8* 5.7*  CL 110 112* 106  --  106  CO2 17* 19* 20*  --  18*  GLUCOSE 80 157* 133*  --  142*  BUN 86* 88* 97*  --  89*  CREATININE 7.89* 7.90* 8.78*  --  9.11*  CALCIUM 7.7* 7.3* 7.7*  --  8.2*  MG  --  1.7 2.9*  --  2.7*  PHOS  --  4.4 5.8*  --  6.7*     Liver Function Tests: Recent Labs  Lab 08/21/21 0100 08/23/21 0548  AST 20 22  ALT 15 17  ALKPHOS 43 48  BILITOT 0.6 0.9  PROT 6.2* 6.1*  ALBUMIN 3.2* 2.7*    No results for input(s): LIPASE, AMYLASE in the last 168 hours. No results for input(s): AMMONIA in the last 168  hours.  CBC: Recent Labs  Lab 08/21/21 0100 08/21/21 0531 08/21/21 0957 08/22/21 0409 08/22/21 1626 08/23/21 0548  WBC 20.1* 13.6*  --  32.8*  --  21.0*  NEUTROABS 18.5*  --   --   --   --   --   HGB 7.4* 6.3* 6.4* 5.9* 7.8* 8.1*  HCT 23.4* 20.1* 19.9* 18.2* 23.2* 23.4*  MCV 87.6 87.0  --  86.7  --  83.6  PLT 188 128*  --  120*  --  101*     Cardiac Enzymes: Recent Labs  Lab 08/21/21 0957  CKTOTAL 75     BNP: Invalid input(s): POCBNP  CBG: Recent Labs  Lab 08/22/21 1509 08/22/21 1925 08/23/21 0052 08/23/21 0336 08/23/21 0720  GLUCAP 111* 116* 140* 133* 122*     Microbiology: Results for orders placed or performed during the hospital encounter of 08/21/21  Resp Panel by RT-PCR (Flu A&B, Covid) Nasopharyngeal Swab     Status: None   Collection Time: 08/21/21  1:00 AM   Specimen: Nasopharyngeal Swab; Nasopharyngeal(NP) swabs in vial transport medium  Result Value Ref Range Status   SARS Coronavirus 2 by RT PCR NEGATIVE NEGATIVE Final    Comment: (NOTE) SARS-CoV-2 target nucleic acids are NOT DETECTED.  The SARS-CoV-2  RNA is generally detectable in upper respiratory specimens during the acute phase of infection. The lowest concentration of SARS-CoV-2 viral copies this assay can detect is 138 copies/mL. A negative result does not preclude SARS-Cov-2 infection and should not be used as the sole basis for treatment or other patient management decisions. A negative result may occur with  improper specimen collection/handling, submission of specimen other than nasopharyngeal swab, presence of viral mutation(s) within the areas targeted by this assay, and inadequate number of viral copies(<138 copies/mL). A negative result must be combined with clinical observations, patient history, and epidemiological information. The expected result is Negative.  Fact Sheet for Patients:  EntrepreneurPulse.com.au  Fact Sheet for Healthcare Providers:   IncredibleEmployment.be  This test is no t yet approved or cleared by the [place] FDA and  has been authorized for detection and/or diagnosis of SARS-CoV-2 by FDA under an Emergency Use Authorization (EUA). This EUA will remain  in effect (meaning this test can be used) for the duration of the COVID-19 declaration under Section 564(b)(1) of the Act, 21 U.S.C.section 360bbb-3(b)(1), unless the authorization is terminated  or revoked sooner.       Influenza A by PCR NEGATIVE NEGATIVE Final   Influenza B by PCR NEGATIVE NEGATIVE Final    Comment: (NOTE) The Xpert Xpress SARS-CoV-2/FLU/RSV plus assay is intended as an aid in the diagnosis of influenza from Nasopharyngeal swab specimens and should not be used as a sole basis for treatment. Nasal washings and aspirates are unacceptable for Xpert Xpress SARS-CoV-2/FLU/RSV testing.  Fact Sheet for Patients: EntrepreneurPulse.com.au  Fact Sheet for Healthcare Providers: IncredibleEmployment.be  This test is not yet approved or cleared by the [place] FDA and has been authorized for detection and/or diagnosis of SARS-CoV-2 by FDA under an Emergency Use Authorization (EUA). This EUA will remain in effect (meaning this test can be used) for the duration of the COVID-19 declaration under Section 564(b)(1) of the Act, 21 U.S.C. section 360bbb-3(b)(1), unless the authorization is terminated or revoked.  Performed at [facility], [address] [place], [place] [address]   Blood Culture (routine x 2)     Status: None (Preliminary result)   Collection Time: 08/21/21  1:04 AM   Specimen: BLOOD  Result Value Ref Range Status   Specimen Description BLOOD RIGHT FOREARM  Final   Special Requests   Final    BOTTLES DRAWN AEROBIC AND ANAEROBIC Blood Culture results may not be optimal due to an inadequate volume of blood received in culture bottles   Culture   Final     NO GROWTH 2 DAYS Performed at [facility], [address] [place], [place] [address]    Report Status PENDING  Incomplete  Blood Culture (routine x 2)     Status: None (Preliminary result)   Collection Time: 08/21/21  1:05 AM   Specimen: BLOOD  Result Value Ref Range Status   Specimen Description BLOOD RIGHT HAND  Final   Special Requests   Final    BOTTLES DRAWN AEROBIC AND ANAEROBIC Blood Culture adequate volume   Culture   Final    NO GROWTH 2 DAYS Performed at [facility], [address], [place], [place] [address]    Report Status PENDING  Incomplete  Urine Culture     Status: Abnormal   Collection Time: 08/21/21  2:05 AM   Specimen: In/Out Cath Urine  Result Value Ref Range Status   Specimen Description   Final    IN/OUT CATH  URINE Performed at [facility], [address], [place], [place] [address]    Special Requests   Final    NONE Performed at [facility], [address] [place], [place] [address]    Culture MULTIPLE SPECIES PRESENT, SUGGEST RECOLLECTION (A)  Final   Report Status 08/22/2021 FINAL  Final  MRSA Next Gen by PCR, Nasal     Status: None   Collection Time: 08/21/21  4:50 AM   Specimen: Nasal Mucosa; Nasal Swab  Result Value Ref Range Status   MRSA by PCR Next Gen NOT DETECTED NOT DETECTED Final    Comment: (NOTE) The GeneXpert MRSA Assay (FDA approved for NASAL specimens only), is one component of a comprehensive MRSA colonization surveillance program. It is not intended to diagnose MRSA infection nor to guide or monitor treatment for MRSA infections. Test performance is not FDA approved in patients less than 52 years old. Performed at [facility] [address] [place], [place] [address]   Respiratory (~20 pathogens) panel by PCR     Status: None   Collection Time: 08/21/21  7:06 AM   Specimen: Nasopharyngeal Swab; Respiratory  Result Value Ref Range Status   Adenovirus NOT DETECTED NOT  DETECTED Final   Coronavirus 229E NOT DETECTED NOT DETECTED Final    Comment: (NOTE) The Coronavirus on the Respiratory Panel, DOES NOT test for the novel  Coronavirus (2019 nCoV)    Coronavirus HKU1 NOT DETECTED NOT DETECTED Final   Coronavirus NL63 NOT DETECTED NOT DETECTED Final   Coronavirus OC43 NOT DETECTED NOT DETECTED Final   Metapneumovirus NOT DETECTED NOT DETECTED Final   Rhinovirus / Enterovirus NOT DETECTED NOT DETECTED Final   Influenza A NOT DETECTED NOT DETECTED Final   Influenza B NOT DETECTED NOT DETECTED Final   Parainfluenza Virus 1 NOT DETECTED NOT DETECTED Final   Parainfluenza Virus 2 NOT DETECTED NOT DETECTED Final   Parainfluenza Virus 3 NOT DETECTED NOT DETECTED Final   Parainfluenza Virus 4 NOT DETECTED NOT DETECTED Final   Respiratory Syncytial Virus NOT DETECTED NOT DETECTED Final   Bordetella pertussis NOT DETECTED NOT DETECTED Final   Bordetella Parapertussis NOT DETECTED NOT DETECTED Final   Chlamydophila pneumoniae NOT DETECTED NOT DETECTED Final   Mycoplasma pneumoniae NOT DETECTED NOT DETECTED Final    Comment: Performed at [facility], [place]. [address] [place], [place] [address]    Coagulation Studies: Recent Labs    08/21/21 0101  LABPROT 13.6  INR 1.0     Urinalysis: Recent Labs    08/21/21 0205 08/23/21 0109  COLORURINE YELLOW YELLOW  LABSPEC 1.020 1.020  PHURINE 8.5* 7.0  GLUCOSEU 100* NEGATIVE  HGBUR SMALL* SMALL*  BILIRUBINUR NEGATIVE NEGATIVE  KETONESUR NEGATIVE NEGATIVE  PROTEINUR >300* >300*  NITRITE NEGATIVE NEGATIVE  LEUKOCYTESUR TRACE* NEGATIVE       Imaging: CT ABDOMEN PELVIS WO CONTRAST  Result Date: 08/22/2021 CLINICAL DATA:  Abdominal distension Nausea/vomiting Abdominal pain, acute, nonlocalized EXAM: CT ABDOMEN AND PELVIS WITHOUT CONTRAST TECHNIQUE: Multidetector CT imaging of the abdomen and pelvis was performed following the standard protocol without IV contrast. COMPARISON:  Chest x-ray 08/21/2021  FINDINGS: Lower chest: Marked peribronchovascular ground-glass airspace opacities of the visualized lung. At least small volume pericardial effusion partially visualized. Trace right pleural effusion. Hepatobiliary: No focal liver abnormality. Likely question gallbladder wall thickening with markedly limited evaluation on this noncontrast study. No biliary dilatation. Query mild periportal edema. Pancreas: No focal lesion. Normal pancreatic contour. No surrounding inflammatory changes. No  main pancreatic ductal dilatation. Spleen: Normal in size without focal abnormality. Adrenals/Urinary Tract: No adrenal nodule bilaterally. No nephrolithiasis and no hydronephrosis. No definite contour-deforming renal mass. No ureterolithiasis or hydroureter. The urinary bladder is unremarkable. Stomach/Bowel: Stomach is within normal limits. No evidence of bowel wall thickening or dilatation. Appendix appears normal. Vascular/Lymphatic: No abdominal aorta or iliac aneurysm. No abdominal, pelvic, or inguinal lymphadenopathy. Reproductive: Uterus and bilateral adnexa are unremarkable. Other: Trace free fluid within the abdomen and pelvis. No intraperitoneal free gas. No organized fluid collection. Musculoskeletal: No abdominal wall hernia or abnormality. No suspicious lytic or blastic osseous lesions. No acute displaced fracture. IMPRESSION: 1. Pulmonary findings suggestive of infection/inflammation versus edema versus alveolar hemorrhage. 2. Partially visualized at least small volume pericardial effusion. 3. Trace right pleural effusion. 4. Likely gallbladder wall thickening with markedly limited evaluation on this noncontrast study. Recommend right upper quadrant ultrasound for further evaluation. 5. Trace simple free fluid within the abdomen and pelvis. Electronically Signed   By: Iven Finn M.D.   On: 08/22/2021 23:59   ECHOCARDIOGRAM COMPLETE  Result Date: 08/22/2021    ECHOCARDIOGRAM REPORT   Patient Name:   [facility] Date of Exam: 08/22/2021 Medical Rec #:  [id number]    Height:       66.0 in Accession #:    [id number]   Weight:       156.3 lb Date of Birth:  2001/08/02    BSA:          1.801 m Patient Age:    20 years     BP:           149/87 mmHg Patient Gender: F            HR:           106 bpm. Exam Location:  [facility] Procedure: 2D Echo, Color Doppler and Cardiac Doppler Indications:     I42.0 Dilated cardiomyopathy  History:         Patient has prior history of Echocardiogram examinations.                  Lupus; Risk Factors:Hypertension.  Sonographer:     Charmayne Sheer Referring Phys:  [id number] BRITTON L RUST-CHESTER Diagnosing Phys: Ida Rogue MD  Sonographer Comments: Suboptimal apical window and suboptimal subcostal window. Image acquisition challenging due to uncooperative patient. Global longitudinal strain was attempted. IMPRESSIONS  1. Moderate pericardial effusion. The pericardial effusion is posterior and lateral to the left ventricle. There is no evidence of cardiac tamponade.  2. Left ventricular ejection fraction, by estimation, is 30 to 35%. The left ventricle has moderately decreased function. The left ventricle demonstrates global hypokinesis. There is mild left ventricular hypertrophy. Left ventricular diastolic parameters are consistent with Grade II diastolic dysfunction (pseudonormalization). The average left ventricular global longitudinal strain is -5.1 %. The global longitudinal strain is abnormal.  3. Right ventricular systolic function is normal. The right ventricular size is normal. There is normal pulmonary artery systolic pressure. The estimated right ventricular systolic pressure is 18.2 mmHg.  4. Left atrial size was moderately dilated.  5. Right atrial size was mildly dilated.  6. The mitral valve is normal in structure. Mild mitral valve regurgitation. No evidence of mitral stenosis.  7. Tricuspid valve regurgitation is moderate.  8. The inferior vena cava is normal in size with greater  than 50% respiratory variability, suggesting right atrial pressure of 3 mmHg. FINDINGS  Left Ventricle: Left ventricular ejection fraction, by estimation, is 30 to  35%. The left ventricle has moderately decreased function. The left ventricle demonstrates global hypokinesis. The average left ventricular global longitudinal strain is -5.1 %.  The global longitudinal strain is abnormal. The left ventricular internal cavity size was normal in size. There is mild left ventricular hypertrophy. Left ventricular diastolic parameters are consistent with Grade II diastolic dysfunction (pseudonormalization). Right Ventricle: The right ventricular size is normal. No increase in right ventricular wall thickness. Right ventricular systolic function is normal. There is normal pulmonary artery systolic pressure. The tricuspid regurgitant velocity is 2.25 m/s, and  with an assumed right atrial pressure of 5 mmHg, the estimated right ventricular systolic pressure is 71.2 mmHg. Left Atrium: Left atrial size was moderately dilated. Right Atrium: Right atrial size was mildly dilated. Pericardium: A moderately sized pericardial effusion is present. The pericardial effusion is posterior and lateral to the left ventricle. There is no evidence of cardiac tamponade. Mitral Valve: The mitral valve is normal in structure. There is mild thickening of the mitral valve leaflet(s). There is mild calcification of the mitral valve leaflet(s). Mild mitral valve regurgitation. No evidence of mitral valve stenosis. MV peak gradient, 5.0 mmHg. The mean mitral valve gradient is 3.0 mmHg. Tricuspid Valve: The tricuspid valve is normal in structure. Tricuspid valve regurgitation is moderate . No evidence of tricuspid stenosis. Aortic Valve: The aortic valve is normal in structure. Aortic valve regurgitation is not visualized. No aortic stenosis is present. Aortic valve mean gradient measures 2.0 mmHg. Aortic valve peak gradient measures 2.7 mmHg. Aortic  valve area, by VTI measures 2.73 cm. Pulmonic Valve: The pulmonic valve was normal in structure. Pulmonic valve regurgitation is mild. No evidence of pulmonic stenosis. Aorta: The aortic root is normal in size and structure. Venous: The inferior vena cava is normal in size with greater than 50% respiratory variability, suggesting right atrial pressure of 3 mmHg. IAS/Shunts: No atrial level shunt detected by color flow Doppler.  LEFT VENTRICLE PLAX 2D LVIDd:         5.40 cm   Diastology LVIDs:         4.40 cm   LV e' medial:    7.51 cm/s LV PW:         1.30 cm   LV E/e' medial:  12.9 LV IVS:        1.10 cm   LV e' lateral:   8.81 cm/s LVOT diam:     2.10 cm   LV E/e' lateral: 11.0 LV SV:         33 LV SV Index:   18        2D Longitudinal Strain LVOT Area:     3.46 cm  2D Strain GLS Avg:     -5.1 %  RIGHT VENTRICLE RV Basal diam:  3.70 cm RV S prime:     7.83 cm/s LEFT ATRIUM             Index        RIGHT ATRIUM           Index LA diam:        5.00 cm 2.78 cm/m   RA Area:     20.60 cm LA Vol (A2C):   30.6 ml 16.99 ml/m  RA Volume:   55.70 ml  30.93 ml/m LA Vol (A4C):   52.4 ml 29.10 ml/m LA Biplane Vol: 44.1 ml 24.49 ml/m  AORTIC VALVE  PULMONIC VALVE AV Area (Vmax):    3.01 cm     PV Vmax:       0.70 m/s AV Area (Vmean):   2.97 cm     PV Vmean:      47.100 cm/s AV Area (VTI):     2.73 cm     PV VTI:        0.134 m AV Vmax:           81.90 cm/s   PV Peak grad:  2.0 mmHg AV Vmean:          59.800 cm/s  PV Mean grad:  1.0 mmHg AV VTI:            0.120 m AV Peak Grad:      2.7 mmHg AV Mean Grad:      2.0 mmHg LVOT Vmax:         71.10 cm/s LVOT Vmean:        51.200 cm/s LVOT VTI:          0.095 m LVOT/AV VTI ratio: 0.79  AORTA Ao Root diam: 3.20 cm MITRAL VALVE               TRICUSPID VALVE MV Area (PHT): 6.07 cm    TR Peak grad:   20.2 mmHg MV Area VTI:   2.75 cm    TR Vmax:        225.00 cm/s MV Peak grad:  5.0 mmHg MV Mean grad:  3.0 mmHg    SHUNTS MV Vmax:       1.12 m/s    Systemic  VTI:  0.09 m MV Vmean:      70.6 cm/s   Systemic Diam: 2.10 cm MV Decel Time: 125 msec MV E velocity: 96.65 cm/s Ida Rogue MD Electronically signed by Ida Rogue MD Signature Date/Time: 08/22/2021/2:05:10 PM    Final    US Abdomen Limited RUQ (LIVER/GB)  Result Date: 08/23/2021 CLINICAL DATA:  History of chronic renal disease with gallbladder wall thickening seen on recent abdomen pelvis CT. EXAM: ULTRASOUND ABDOMEN LIMITED RIGHT UPPER QUADRANT COMPARISON:  Abdomen pelvis CT, dated August 22, 2021. FINDINGS: Gallbladder: A thin, curvilinear nonshadowing echogenic area is seen along the wall of the gallbladder fundus. No gallstones or wall thickening visualized (2.8 mm). No sonographic Murphy sign noted by sonographer. Common bile duct: Diameter: 4.2 mm Liver: No focal lesion identified. Within normal limits in parenchymal echogenicity. Portal vein is patent on color Doppler imaging with normal direction of blood flow towards the liver. Other: Of incidental note is the presence a right-sided pleural effusion and an echogenic right kidney. IMPRESSION: 1. Thin, echogenic area along the wall of the gallbladder fundus which may represent very mild gallbladder wall calcification. 2. No evidence of acute cholecystitis. Further evaluation with a nuclear medicine hepatobiliary scan is recommended if this is of clinical concern. 3. Small right pleural effusion. 4. Echogenic right kidney which may be secondary to medical renal disease related to the patient's history of chronic renal disease. Electronically Signed   By: Virgina Norfolk M.D.   On: 08/23/2021 02:15     Medications:    azithromycin Stopped (08/22/21 1005)   cefTRIAXone (ROCEPHIN)  IV Stopped (08/22/21 1639)   dextrose 30 mL/hr at 08/23/21 0600   lacosamide (VIMPAT) IV Stopped (08/22/21 2356)   metronidazole Stopped (08/23/21 5726)    carvedilol  25 mg Oral Daily   Chlorhexidine Gluconate Cloth  6 each Topical Q0600   cloNIDine  0.3  mg Transdermal Weekly   heparin  5,000 Units Subcutaneous Q8H   hydroxychloroquine  400 mg Oral Daily   methylPREDNISolone (SOLU-MEDROL) injection  80 mg Intravenous Q12H   NIFEdipine  90 mg Oral Daily   pantoprazole (PROTONIX) IV  40 mg Intravenous Q24H   patiromer  8.4 g Oral Daily   pravastatin  20 mg Oral Daily   acetaminophen, bismuth subsalicylate, docusate sodium, hydrALAZINE, labetalol, levalbuterol, LORazepam, ondansetron (ZOFRAN) IV, oxyCODONE, polyethylene glycol  Assessment/ Plan:  20 y.o. 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     1.  Acute kidney injury/chronic kidney disease stage IV/class IV lupus nephritis/proteinuria.  Patient has been actively followed by [facility] nephrology.  In the past has been treated with Cytoxan.  Given her young age it was felt that further Cytoxan treatment should be avoided.  Acute kidney injury now could be secondary to progressive lupus nephritis. -Renal function appears to be worse today, Cr up to 9.11.  Pt did agree to permcath placement yesterday and we are awaiting permcath placement today which will be followed by first dialysis session.  We will plan for second dialysis session tomorrow.  Consider renal biopsy once uremia improved.  In the meantime pt has been started on solumedrol.   2.  Hyperkalemia.  K down to 5.7, continue veltassa, dialysis should also help to correct.    3.  Anemia of chronic kidney disease/hemolytic anemia in the setting of lupus nephritis.  Hemoglobin up to 8.1 post transfusion, will continue to monitor.    LOS: 2 Kiva Norland 11/28/20229:33 AM

## 2021-08-24 ENCOUNTER — Other Ambulatory Visit: Payer: Self-pay

## 2021-08-24 DIAGNOSIS — I5022 Chronic systolic (congestive) heart failure: Secondary | ICD-10-CM

## 2021-08-24 DIAGNOSIS — R140 Abdominal distension (gaseous): Secondary | ICD-10-CM

## 2021-08-24 DIAGNOSIS — I3139 Other pericardial effusion (noninflammatory): Secondary | ICD-10-CM | POA: Diagnosis not present

## 2021-08-24 LAB — CBC WITH DIFFERENTIAL/PLATELET
Abs Immature Granulocytes: 0.09 10*3/uL — ABNORMAL HIGH (ref 0.00–0.07)
Basophils Absolute: 0 10*3/uL (ref 0.0–0.1)
Basophils Relative: 0 %
Eosinophils Absolute: 0 10*3/uL (ref 0.0–0.5)
Eosinophils Relative: 0 %
HCT: 21.9 % — ABNORMAL LOW (ref 36.0–46.0)
Hemoglobin: 7.5 g/dL — ABNORMAL LOW (ref 12.0–15.0)
Immature Granulocytes: 1 %
Lymphocytes Relative: 3 %
Lymphs Abs: 0.2 10*3/uL — ABNORMAL LOW (ref 0.7–4.0)
MCH: 28.7 pg (ref 26.0–34.0)
MCHC: 34.2 g/dL (ref 30.0–36.0)
MCV: 83.9 fL (ref 80.0–100.0)
Monocytes Absolute: 0.4 10*3/uL (ref 0.1–1.0)
Monocytes Relative: 4 %
Neutro Abs: 8.6 10*3/uL — ABNORMAL HIGH (ref 1.7–7.7)
Neutrophils Relative %: 92 %
Platelets: 110 10*3/uL — ABNORMAL LOW (ref 150–400)
RBC: 2.61 MIL/uL — ABNORMAL LOW (ref 3.87–5.11)
RDW: 13.9 % (ref 11.5–15.5)
WBC: 9.3 10*3/uL (ref 4.0–10.5)
nRBC: 0.4 % — ABNORMAL HIGH (ref 0.0–0.2)

## 2021-08-24 LAB — RENAL FUNCTION PANEL
Albumin: 2.8 g/dL — ABNORMAL LOW (ref 3.5–5.0)
Anion gap: 12 (ref 5–15)
BUN: 89 mg/dL — ABNORMAL HIGH (ref 6–20)
CO2: 21 mmol/L — ABNORMAL LOW (ref 22–32)
Calcium: 7.8 mg/dL — ABNORMAL LOW (ref 8.9–10.3)
Chloride: 101 mmol/L (ref 98–111)
Creatinine, Ser: 8.17 mg/dL — ABNORMAL HIGH (ref 0.44–1.00)
GFR, Estimated: 7 mL/min — ABNORMAL LOW (ref >60)
Glucose, Bld: 179 mg/dL — ABNORMAL HIGH (ref 70–99)
Phosphorus: 7.4 mg/dL — ABNORMAL HIGH (ref 2.5–4.6)
Potassium: 4.9 mmol/L (ref 3.5–5.1)
Sodium: 134 mmol/L — ABNORMAL LOW (ref 135–145)

## 2021-08-24 LAB — GLUCOSE, CAPILLARY
Glucose-Capillary: 120 mg/dL — ABNORMAL HIGH (ref 70–99)
Glucose-Capillary: 134 mg/dL — ABNORMAL HIGH (ref 70–99)
Glucose-Capillary: 156 mg/dL — ABNORMAL HIGH (ref 70–99)
Glucose-Capillary: 180 mg/dL — ABNORMAL HIGH (ref 70–99)
Glucose-Capillary: 189 mg/dL — ABNORMAL HIGH (ref 70–99)
Glucose-Capillary: 200 mg/dL — ABNORMAL HIGH (ref 70–99)

## 2021-08-24 LAB — C3 COMPLEMENT: C3 Complement: 83 mg/dL (ref 82–167)

## 2021-08-24 LAB — C4 COMPLEMENT: Complement C4, Body Fluid: 21 mg/dL (ref 12–38)

## 2021-08-24 LAB — MAGNESIUM: Magnesium: 2.3 mg/dL (ref 1.7–2.4)

## 2021-08-24 LAB — PARATHYROID HORMONE, INTACT (NO CA): PTH: 181 pg/mL — ABNORMAL HIGH (ref 15–65)

## 2021-08-24 LAB — PHOSPHORUS: Phosphorus: 7.4 mg/dL — ABNORMAL HIGH (ref 2.5–4.6)

## 2021-08-24 MED ORDER — CARVEDILOL 25 MG PO TABS
25.0000 mg | ORAL_TABLET | Freq: Two times a day (BID) | ORAL | Status: DC
  Administered 2021-08-24 – 2021-08-27 (×7): 25 mg via ORAL
  Filled 2021-08-24 (×6): qty 1

## 2021-08-24 MED ORDER — ISOSORB DINITRATE-HYDRALAZINE 20-37.5 MG PO TABS
1.0000 | ORAL_TABLET | Freq: Three times a day (TID) | ORAL | Status: DC
  Administered 2021-08-24 (×2): 1 via ORAL
  Filled 2021-08-24 (×3): qty 1

## 2021-08-24 MED ORDER — HEPARIN SODIUM (PORCINE) 1000 UNIT/ML IJ SOLN
INTRAMUSCULAR | Status: AC
  Filled 2021-08-24: qty 10

## 2021-08-24 MED ORDER — PANTOPRAZOLE SODIUM 40 MG PO TBEC
40.0000 mg | DELAYED_RELEASE_TABLET | Freq: Every day | ORAL | Status: DC
  Administered 2021-08-25 – 2021-08-26 (×2): 40 mg via ORAL
  Filled 2021-08-24 (×2): qty 1

## 2021-08-24 MED ORDER — LACOSAMIDE 50 MG PO TABS
150.0000 mg | ORAL_TABLET | Freq: Two times a day (BID) | ORAL | Status: DC
  Administered 2021-08-24 – 2021-08-27 (×6): 150 mg via ORAL
  Filled 2021-08-24 (×6): qty 3

## 2021-08-24 NOTE — Consult Note (Signed)
Rheumatology follow-up: Had dialysis x2.  No shortness of breath.  No chest pain.  Blood pressure still running up. No joint pain.  No new skin rash.  No recurrent seizures  Exam: Clear chest.  No significant edema.  No synovitis. Labs: Normal complement C3-C4 and sed rate.  Pending anti-DNA .no significant improvement in creatinine  Impressions: Chronic systemic lupus now with  -significant CKD.  Status post Cytoxan and recent Benlysta.  Normal complement.  Concerned about how reversible this will be.  Anemia, improved  Cardiomyopathy, previously a manifestation of lupus now with a poor ejection fraction   pericardial effusion.  No clear evidence of tamponade  Recommendations: Consider lowering IV steroid from 80 mg IV every 12 to 80 mg of Solu-Medrol a day.  Initial recommendation from [facility] was 1 milligram per kilogram. Per the nursing staff, she is pending [facility] transfer

## 2021-08-24 NOTE — Progress Notes (Signed)
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  When EMS arrived she was postictal.  When she became unresponsive sounds like she was gasping and choking.  Neurology was consulted on admission, she was loaded with Vimpat.  She also met criteria for sepsis and was hypoxic requiring heated high flow nasal cannula.  She was initially admitted to the ICU, improved and eventually transferred to the hospitalist service on 11/29  ED course: Per EDP patient appeared post ictal on arrival, now oriented & moving all extremities. Due to breakthrough seizure while on Vimpat, EDP discussed the case with Pharmacist and a loading dose was given IV. Patient was switched to Vimpat from [place] due to breakthrough seizures. Patient met criteria for Sepsis workup and protocol initiated with only 1 L of IVF given due to BNP > 4500. Patient was transitioned to [facility] because of hypoxia.  Subjective / 24h Interval events: She is doing well today.  She denies any shortness of breath, denies any chest pain.  No abdominal pain, no nausea or vomiting.  Assessment & Plan: Principal Problem Acute hypoxic respiratory failure due to multifocal pneumonia, sepsis -initially requiring high flow nasal cannula, currently improved on room air.  She has been placed on IV antibiotics with ceftriaxone and metronidazole, improving, sepsis physiology resolved.  Active Problems SLE-rheumatology consulted and evaluated patient 11/27.  Currently  recommending to boost with IV steroids 8 mg Solu-Medrol twice daily.  She is awaiting transfer to [facility] to be evaluated by her outpatient team.  Continue hydroxychloroquine as well  Seizure disorder-apparently patient missed a dose of Vimpat prior to the seizure.  She was on Keppra in the past but she was changed to Vimpat due to breakthrough seizures.  She is currently on 150 mg every 12, she has been seizure-free.  Neurology signed off on 11/27  Acute kidney injury on chronic kidney disease stage IIIb -nephrology consulted, has progressed to the point that she had to have a dialysis catheter placed on 11/28 and underwent HD same day.  Briefly discussed with Dr. 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  Cardiology consulted due to worsening EF 30-35% down from 55% in September 2022 and moderate to large pericardial effusion.  Appreciate input, volume management with dialysis, no evidence of cardiac tamponade and per cardiology no role for NSAIDs.  For now she is to continue carvedilol, to consider resuming spironolactone and Entresto if there is no chance of renal recovery.  Empagliflozin is contraindicated in ESRD  Hyperkalemia, hyperphosphatemia-due to renal failure, managed with HD  Non-anion gap metabolic acidosis-due to renal failure  Anemia of chronic renal disease, history of hemolytic anemia-monitor hemoglobin, currently 7.5  Thrombocytopenia-due to renal failure, monitor  Essential hypertension-continue carvedilol, clonidine, nifedipine  Hyperlipidemia-continue statin  Scheduled Meds:  carvedilol  25 mg Oral Daily   Chlorhexidine Gluconate Cloth  6 each Topical Q0600   Chlorhexidine Gluconate Cloth  6 each Topical Q0600   chlorthalidone  50 mg Oral Daily   cloNIDine  0.3 mg Transdermal Weekly   heparin  5,000 Units Subcutaneous Q8H   hydroxychloroquine   400 mg Oral Daily   methylPREDNISolone (SOLU-MEDROL) injection  80 mg Intravenous Q12H   NIFEdipine  90 mg Oral Daily   pantoprazole (PROTONIX) IV  40 mg Intravenous Q24H   patiromer  8.4 g Oral Once   pravastatin  20 mg Oral Daily   Continuous Infusions:  sodium chloride     sodium chloride     cefTRIAXone (ROCEPHIN)  IV Stopped (08/22/21 1639)   lacosamide (VIMPAT) IV Stopped (08/23/21 2254)   PRN Meds:.sodium chloride, sodium chloride, acetaminophen, alteplase, bismuth subsalicylate, docusate sodium, heparin, HYDROmorphone (DILAUDID) injection, labetalol, levalbuterol, lidocaine (PF), lidocaine-prilocaine, LORazepam, ondansetron (ZOFRAN) IV, ondansetron (ZOFRAN) IV, oxyCODONE, pentafluoroprop-tetrafluoroeth, polyethylene glycol  Diet Orders (From admission, onward)     Start     Ordered   08/23/21 1620  Diet renal with fluid restriction Fluid restriction: 1200 mL Fluid; Room service appropriate? Yes; Fluid consistency: Thin  Diet effective now       Question Answer Comment  Fluid restriction: 1200 mL Fluid   Room service appropriate? Yes   Fluid consistency: Thin      08/23/21 1619            DVT prophylaxis: heparin injection 5,000 Units Start: 08/21/21 0600 SCDs Start: 08/21/21 0159    Code Status: Full Code  Family Communication: no family at bedside  Status is: Inpatient  Remains inpatient appropriate because: Severity of illness  Level of care: Stepdown  Consultants:  Nephrology  Cardiology  PCCM Neurology  Vascular surgery Rheumatology  Procedures:  R IJ placement by vascular 11/28 08/21/2021: Chest x-ray>>IMPRESSION: 1. Extensive patchy airspace disease throughout the right upper and lower lobes, concerning for multifocal pneumonia. 2. Underlying cardiomegaly with perihilar vascular congestion without overt pulmonary edema. 3. Probable trace bilateral pleural effusions. 08/21/2021: CT head without contrast>>No acute intracranial  pathology. 08/21/2021: CT cervical spine>>No acute fracture or subluxation of the cervical spine 08/22/2021: CT abdomen and pelvis without contrast>>IMPRESSION: 1. Pulmonary findings suggestive of infection/inflammation versus edema versus alveolar hemorrhage. 2. Partially visualized at least small volume pericardial effusion. 3. Trace right pleural effusion. 4. Likely gallbladder wall thickening with markedly limited evaluation on this noncontrast study. Recommend right upper quadrant ultrasound for further evaluation.  5. Trace simple free fluid within the abdomen and pelvis 08/23/2021: Right upper quadrant abdominal ultrasound>>IMPRESSION: 1. Thin, echogenic area along the wall of the gallbladder fundus which may represent very mild gallbladder wall calcification. 2. No evidence of acute cholecystitis. Further evaluation with a nuclear medicine hepatobiliary scan is recommended if this is of clinical concern. 3. Small right pleural effusion. 4. Echogenic right kidney which may be secondary to medical renal disease related to the patient's history of chronic renal disease. 08/22/21: Echocardiogram>>  1. Moderate pericardial effusion. The pericardial effusion is posterior  and lateral to the left ventricle. There is no evidence of cardiac  tamponade.   2. Left ventricular ejection fraction, by estimation, is 30 to 35%. The  left ventricle has moderately decreased function. The left ventricle  demonstrates global hypokinesis. There is mild left ventricular  hypertrophy. Left ventricular diastolic  parameters are consistent with Grade II diastolic dysfunction  (pseudonormalization). The average left ventricular global longitudinal  strain is -5.1 %. The global longitudinal strain is abnormal.   3. Right ventricular systolic function is normal. The right ventricular  size is normal. There is normal pulmonary artery systolic pressure. The  estimated right ventricular systolic pressure is  25.2 mmHg.    4. Left atrial size was moderately dilated.   5. Right atrial size was mildly dilated.   6. The mitral valve is normal in structure. Mild mitral valve regurgitation. No evidence of mitral stenosis.   7. Tricuspid valve regurgitation is moderate.   8. The inferior vena cava is normal in size with greater than 50% respiratory variability, suggesting right atrial pressure of 3 mmHg.   Microbiology  Blood cultures 11/26-no growth to date Urine culture 11/26-multiple species, suggest recollection SARS COVID 2, flu A, flu B, RVP-negative MRSA PCR-negative  Antimicrobials: Azithromycin 11/26 >> Ceftriaxone 11/26 >>   Objective: Vitals:   08/24/21 0600 08/24/21 0700 08/24/21 0756 08/24/21 0800  BP: (!) 155/106 (!) 141/107  (!) 144/109  Pulse: (!) 105 (!) 105  (!) 105  Resp: 15 (!) 31  (!) 25  Temp:   99 F (37.2 C)   TempSrc:   Oral   SpO2: 100% 93%  97%  Weight:      Height:        Intake/Output Summary (Last 24 hours) at 08/24/2021 0900 Last data filed at 08/24/2021 0700 Gross per 24 hour  Intake 731.64 ml  Output 0 ml  Net 731.64 ml   Filed Weights   08/21/21 0058 08/22/21 0500 08/23/21 0500  Weight: 72.6 kg 70.9 kg 70.5 kg    Examination:  Constitutional: NAD Eyes: no scleral icterus ENMT: Mucous membranes are moist.  Neck: normal, supple Respiratory: clear to auscultation bilaterally, no wheezing, no crackles. Normal respiratory effort. Cardiovascular: Regular rate and rhythm, no murmurs / rubs / gallops. No LE edema.  Abdomen: non distended, no tenderness. Bowel sounds positive.  Musculoskeletal: no clubbing / cyanosis.  Skin: no rashes Neurologic: CN 2-12 grossly intact. Strength 5/5 in all 4.  Psychiatric: Normal judgment and insight. Alert and oriented x 3. Normal mood.    Data Reviewed: I have independently reviewed following labs and imaging studies  CBC: Recent Labs  Lab 08/21/21 0100 08/21/21 0531 08/21/21 0957 08/22/21 0409 08/22/21 1626  08/23/21 0548 08/24/21 0416  WBC 20.1* 13.6*  --  32.8*  --  21.0* 9.3  NEUTROABS 18.5*  --   --   --   --   --  8.6*  HGB 7.4* 6.3* 6.4* 5.9* 7.8* 8.1* 7.5*  HCT 23.4* 20.1* 19.9* 18.2* 23.2* 23.4* 21.9*  MCV 87.6 87.0  --  86.7  --  83.6 83.9  PLT 188 128*  --  120*  --  101* 017*   Basic Metabolic Panel: Recent Labs  Lab 08/21/21 0100 08/21/21 0531 08/22/21 0409 08/22/21 2008 08/23/21 0548 08/23/21 1110 08/24/21 0416  NA 137 139 137  --  135  --  134*  K 5.1 5.1 6.1* 5.8* 5.7* 5.6* 4.9  CL 110 112* 106  --  106  --  101  CO2 17* 19* 20*  --  18*  --  21*  GLUCOSE 80 157* 133*  --  142*  --  179*  BUN 86* 88* 97*  --  89*  --  89*  CREATININE 7.89* 7.90* 8.78*  --  9.11*  --  8.17*  CALCIUM 7.7* 7.3* 7.7*  --  8.2*  --  7.8*  MG  --  1.7 2.9*  --  2.7*  --  2.3  PHOS  --  4.4 5.8*  --  6.7* 7.2* 7.4*  7.4*   Liver Function Tests: Recent Labs  Lab 08/21/21 0100 08/23/21 0548 08/24/21 0416  AST 20 22  --   ALT 15 17  --   ALKPHOS 43 48  --   BILITOT 0.6 0.9  --   PROT 6.2* 6.1*  --   ALBUMIN 3.2* 2.7* 2.8*   Coagulation Profile: Recent Labs  Lab 08/21/21 0101  INR 1.0   HbA1C: No results for input(s): HGBA1C in the last 72 hours. CBG: Recent Labs  Lab 08/23/21 1549 08/23/21 2056 08/24/21 0029 08/24/21 0413 08/24/21 0754  GLUCAP 139* 154* 200* 180* 156*    Recent Results (from the past 240 hour(s))  Resp Panel by RT-PCR (Flu A&B, Covid) Nasopharyngeal Swab     Status: None   Collection Time: 08/21/21  1:00 AM   Specimen: Nasopharyngeal Swab; Nasopharyngeal(NP) swabs in vial transport medium  Result Value Ref Range Status   SARS Coronavirus 2 by RT PCR NEGATIVE NEGATIVE Final    Comment: (NOTE) SARS-CoV-2 target nucleic acids are NOT DETECTED.  The SARS-CoV-2 RNA is generally detectable in upper respiratory specimens during the acute phase of infection. The lowest concentration of SARS-CoV-2 viral copies this assay can detect is 138 copies/mL.  A negative result does not preclude SARS-Cov-2 infection and should not be used as the sole basis for treatment or other patient management decisions. A negative result may occur with  improper specimen collection/handling, submission of specimen other than nasopharyngeal swab, presence of viral mutation(s) within the areas targeted by this assay, and inadequate number of viral copies(<138 copies/mL). A negative result must be combined with clinical observations, patient history, and epidemiological information. The expected result is Negative.  Fact Sheet for Patients:  EntrepreneurPulse.com.au  Fact Sheet for Healthcare Providers:  IncredibleEmployment.be  This test is no t yet approved or cleared by the [place] FDA and  has been authorized for detection and/or diagnosis of SARS-CoV-2 by FDA under an Emergency Use Authorization (EUA). This EUA will remain  in effect (meaning this test can be used) for the duration of the COVID-19 declaration under Section 564(b)(1) of the Act, 21 U.S.C.section 360bbb-3(b)(1), unless the authorization is terminated  or revoked sooner.       Influenza A by PCR NEGATIVE NEGATIVE Final   Influenza B by PCR NEGATIVE NEGATIVE Final    Comment: (NOTE) The Xpert Xpress SARS-CoV-2/FLU/RSV plus assay is intended as an aid in the diagnosis of influenza from Nasopharyngeal swab specimens and should not be used as a sole basis for treatment. Nasal washings and aspirates are unacceptable for Xpert Xpress SARS-CoV-2/FLU/RSV testing.  Fact Sheet for Patients: EntrepreneurPulse.com.au  Fact Sheet for Healthcare Providers: IncredibleEmployment.be  This test is not yet approved or cleared by the [place] FDA and has been authorized for detection and/or diagnosis of SARS-CoV-2 by FDA under an Emergency Use Authorization (EUA). This EUA will remain in effect (meaning this test  can be used) for the duration of the COVID-19 declaration under Section 564(b)(1) of the Act, 21 U.S.C. section 360bbb-3(b)(1), unless the authorization is terminated or revoked.  Performed at [facility], [address] [place], [place] [address]   Blood Culture (routine x 2)     Status: None (Preliminary result)   Collection Time: 08/21/21  1:04 AM   Specimen: BLOOD  Result Value Ref Range Status   Specimen Description BLOOD RIGHT FOREARM  Final   Special Requests   Final    BOTTLES DRAWN AEROBIC AND ANAEROBIC Blood Culture results may not be optimal due to an inadequate volume of blood received in culture bottles   Culture  Final    NO GROWTH 2 DAYS Performed at [facility], [address] [place], [place] [address]    Report Status PENDING  Incomplete  Blood Culture (routine x 2)     Status: None (Preliminary result)   Collection Time: 08/21/21  1:05 AM   Specimen: BLOOD  Result Value Ref Range Status   Specimen Description BLOOD RIGHT HAND  Final   Special Requests   Final    BOTTLES DRAWN AEROBIC AND ANAEROBIC Blood Culture adequate volume   Culture   Final    NO GROWTH 2 DAYS Performed at [facility], [address], [place], [place] [address]    Report Status PENDING  Incomplete  Urine Culture     Status: Abnormal   Collection Time: 08/21/21  2:05 AM   Specimen: In/Out Cath Urine  Result Value Ref Range Status   Specimen Description   Final    IN/OUT CATH URINE Performed at [facility], [address], [place], [place] [address]    Special Requests   Final    NONE Performed at [facility], [address] [place], [place] [address]    Culture MULTIPLE SPECIES PRESENT, SUGGEST RECOLLECTION (A)  Final   Report Status 08/22/2021 FINAL  Final  MRSA Next Gen by PCR, Nasal     Status: None   Collection Time: 08/21/21  4:50 AM   Specimen: Nasal Mucosa; Nasal Swab  Result Value Ref Range Status   MRSA by PCR  Next Gen NOT DETECTED NOT DETECTED Final    Comment: (NOTE) The GeneXpert MRSA Assay (FDA approved for NASAL specimens only), is one component of a comprehensive MRSA colonization surveillance program. It is not intended to diagnose MRSA infection nor to guide or monitor treatment for MRSA infections. Test performance is not FDA approved in patients less than 56 years old. Performed at [facility], [address] [place], [place] [address]   Respiratory (~20 pathogens) panel by PCR     Status: None   Collection Time: 08/21/21  7:06 AM   Specimen: Nasopharyngeal Swab; Respiratory  Result Value Ref Range Status   Adenovirus NOT DETECTED NOT DETECTED Final   Coronavirus 229E NOT DETECTED NOT DETECTED Final    Comment: (NOTE) The Coronavirus on the Respiratory Panel, DOES NOT test for the novel  Coronavirus (2019 nCoV)    Coronavirus HKU1 NOT DETECTED NOT DETECTED Final   Coronavirus NL63 NOT DETECTED NOT DETECTED Final   Coronavirus OC43 NOT DETECTED NOT DETECTED Final   Metapneumovirus NOT DETECTED NOT DETECTED Final   Rhinovirus / Enterovirus NOT DETECTED NOT DETECTED Final   Influenza A NOT DETECTED NOT DETECTED Final   Influenza B NOT DETECTED NOT DETECTED Final   Parainfluenza Virus 1 NOT DETECTED NOT DETECTED Final   Parainfluenza Virus 2 NOT DETECTED NOT DETECTED Final   Parainfluenza Virus 3 NOT DETECTED NOT DETECTED Final   Parainfluenza Virus 4 NOT DETECTED NOT DETECTED Final   Respiratory Syncytial Virus NOT DETECTED NOT DETECTED Final   Bordetella pertussis NOT DETECTED NOT DETECTED Final   Bordetella Parapertussis NOT DETECTED NOT DETECTED Final   Chlamydophila pneumoniae NOT DETECTED NOT DETECTED Final   Mycoplasma pneumoniae NOT DETECTED NOT DETECTED Final    Comment: Performed at [facility], [place]. [address], [place], [place] [address]     Radiology Studies: PERIPHERAL VASCULAR CATHETERIZATION  Result Date: 08/23/2021 See surgical note for  result.   Marzetta Board, MD, PhD [facility]  Between 7 am - 7 pm I am available, please contact me via Amion (for emergencies) or Securechat (non urgent messages)  Between 7 pm - 7 am I am not available, please contact night coverage MD/APP via Amion

## 2021-08-24 NOTE — Progress Notes (Signed)
PHARMACIST - PHYSICIAN COMMUNICATION   CONCERNING: IV to Oral Route Change Policy  RECOMMENDATION: This patient is receiving pantoprazole by the intravenous route.  Based on criteria approved by the Pharmacy and Therapeutics Committee, the intravenous medication(s) is/are being converted to the equivalent oral dose form(s).   DESCRIPTION: These criteria include: The patient is eating (either orally or via tube) and/or has been taking other orally administered medications for a least 24 hours The patient has no evidence of active gastrointestinal bleeding or impaired GI absorption (gastrectomy, short bowel, patient on TNA or NPO).  If you have questions about this conversion, please contact the [place], PharmD, BCPS Clinical Pharmacist 08/24/2021 1:35 PM

## 2021-08-24 NOTE — Progress Notes (Signed)
Patient tolerates second treatment without incident.  CVC functioned well, maintaining prescribed BFR. No meds given. No fluid removed this session, vital signs at presenting baseline.

## 2021-08-24 NOTE — Plan of Care (Signed)

## 2021-08-24 NOTE — Progress Notes (Signed)
[facility]  ROUNDING NOTE   Subjective:  Patient underwent first dialysis treatment yesterday. Due for second dialysis treatment today. Appears to be in better spirits today.   Objective:  Vital signs in last 24 hours:  Temp:  [98.4 F (36.9 C)-99.2 F (37.3 C)] 99 F (37.2 C) (11/29 0756) Pulse Rate:  [100-123] 105 (11/29 0800) Resp:  [10-35] 25 (11/29 0800) BP: (139-172)/(101-135) 144/109 (11/29 0800) SpO2:  [90 %-100 %] 97 % (11/29 0800)  Weight change:  Filed Weights   08/21/21 0058 08/22/21 0500 08/23/21 0500  Weight: 72.6 kg 70.9 kg 70.5 kg    Intake/Output: I/O last 3 completed shifts: In: 1245.1 [P.O.:237; I.V.:463; IV Piggyback:545] Out: 215 [Urine:215]   Intake/Output this shift:  No intake/output data recorded.  Physical Exam: General: No acute distress  Head: Normocephalic, atraumatic. Moist oral mucosal membranes  Eyes: Anicteric  Neck: Supple  Lungs:  Clear to auscultation, normal effort  Heart: S1S2 no rubs  Abdomen:  Soft, nontender, bowel sounds present  Extremities: No peripheral edema.  Neurologic: Awake, alert, conversant.  Skin: No acute rash  Access: No hemodialysis access    Basic Metabolic Panel: Recent Labs  Lab 08/21/21 0100 08/21/21 0531 08/22/21 0409 08/22/21 2008 08/23/21 0548 08/23/21 1110 08/24/21 0416  NA 137 139 137  --  135  --  134*  K 5.1 5.1 6.1* 5.8* 5.7* 5.6* 4.9  CL 110 112* 106  --  106  --  101  CO2 17* 19* 20*  --  18*  --  21*  GLUCOSE 80 157* 133*  --  142*  --  179*  BUN 86* 88* 97*  --  89*  --  89*  CREATININE 7.89* 7.90* 8.78*  --  9.11*  --  8.17*  CALCIUM 7.7* 7.3* 7.7*  --  8.2*  --  7.8*  MG  --  1.7 2.9*  --  2.7*  --  2.3  PHOS  --  4.4 5.8*  --  6.7* 7.2* 7.4*  7.4*     Liver Function Tests: Recent Labs  Lab 08/21/21 0100 08/23/21 0548 08/24/21 0416  AST 20 22  --   ALT 15 17  --   ALKPHOS 43 48  --   BILITOT 0.6 0.9  --   PROT 6.2* 6.1*  --   ALBUMIN 3.2* 2.7*  2.8*    No results for input(s): LIPASE, AMYLASE in the last 168 hours. No results for input(s): AMMONIA in the last 168 hours.  CBC: Recent Labs  Lab 08/21/21 0100 08/21/21 0531 08/21/21 0957 08/22/21 0409 08/22/21 1626 08/23/21 0548 08/24/21 0416  WBC 20.1* 13.6*  --  32.8*  --  21.0* 9.3  NEUTROABS 18.5*  --   --   --   --   --  8.6*  HGB 7.4* 6.3* 6.4* 5.9* 7.8* 8.1* 7.5*  HCT 23.4* 20.1* 19.9* 18.2* 23.2* 23.4* 21.9*  MCV 87.6 87.0  --  86.7  --  83.6 83.9  PLT 188 128*  --  120*  --  101* 110*     Cardiac Enzymes: Recent Labs  Lab 08/21/21 0957  CKTOTAL 75     BNP: Invalid input(s): POCBNP  CBG: Recent Labs  Lab 08/23/21 1549 08/23/21 2056 08/24/21 0029 08/24/21 0413 08/24/21 0754  GLUCAP 139* 154* 200* 180* 156*     Microbiology: Results for orders placed or performed during the hospital encounter of 08/21/21  Resp Panel by RT-PCR (Flu A&B, Covid) Nasopharyngeal Swab  Status: None   Collection Time: 08/21/21  1:00 AM   Specimen: Nasopharyngeal Swab; Nasopharyngeal(NP) swabs in vial transport medium  Result Value Ref Range Status   SARS Coronavirus 2 by RT PCR NEGATIVE NEGATIVE Final    Comment: (NOTE) SARS-CoV-2 target nucleic acids are NOT DETECTED.  The SARS-CoV-2 RNA is generally detectable in upper respiratory specimens during the acute phase of infection. The lowest concentration of SARS-CoV-2 viral copies this assay can detect is 138 copies/mL. A negative result does not preclude SARS-Cov-2 infection and should not be used as the sole basis for treatment or other patient management decisions. A negative result may occur with  improper specimen collection/handling, submission of specimen other than nasopharyngeal swab, presence of viral mutation(s) within the areas targeted by this assay, and inadequate number of viral copies(<138 copies/mL). A negative result must be combined with clinical observations, patient history, and  epidemiological information. The expected result is Negative.  Fact Sheet for Patients:  EntrepreneurPulse.com.au  Fact Sheet for Healthcare Providers:  IncredibleEmployment.be  This test is no t yet approved or cleared by the [place] FDA and  has been authorized for detection and/or diagnosis of SARS-CoV-2 by FDA under an Emergency Use Authorization (EUA). This EUA will remain  in effect (meaning this test can be used) for the duration of the COVID-19 declaration under Section 564(b)(1) of the Act, 21 U.S.C.section 360bbb-3(b)(1), unless the authorization is terminated  or revoked sooner.       Influenza A by PCR NEGATIVE NEGATIVE Final   Influenza B by PCR NEGATIVE NEGATIVE Final    Comment: (NOTE) The Xpert Xpress SARS-CoV-2/FLU/RSV plus assay is intended as an aid in the diagnosis of influenza from Nasopharyngeal swab specimens and should not be used as a sole basis for treatment. Nasal washings and aspirates are unacceptable for Xpert Xpress SARS-CoV-2/FLU/RSV testing.  Fact Sheet for Patients: EntrepreneurPulse.com.au  Fact Sheet for Healthcare Providers: IncredibleEmployment.be  This test is not yet approved or cleared by the [place] FDA and has been authorized for detection and/or diagnosis of SARS-CoV-2 by FDA under an Emergency Use Authorization (EUA). This EUA will remain in effect (meaning this test can be used) for the duration of the COVID-19 declaration under Section 564(b)(1) of the Act, 21 U.S.C. section 360bbb-3(b)(1), unless the authorization is terminated or revoked.  Performed at [facility], [address] [place], [place] [address]   Blood Culture (routine x 2)     Status: None (Preliminary result)   Collection Time: 08/21/21  1:04 AM   Specimen: BLOOD  Result Value Ref Range Status   Specimen Description BLOOD RIGHT FOREARM  Final   Special Requests    Final    BOTTLES DRAWN AEROBIC AND ANAEROBIC Blood Culture results may not be optimal due to an inadequate volume of blood received in culture bottles   Culture   Final    NO GROWTH 2 DAYS Performed at [facility], [address], [place], [place] [address]    Report Status PENDING  Incomplete  Blood Culture (routine x 2)     Status: None (Preliminary result)   Collection Time: 08/21/21  1:05 AM   Specimen: BLOOD  Result Value Ref Range Status   Specimen Description BLOOD RIGHT HAND  Final   Special Requests   Final    BOTTLES DRAWN AEROBIC AND ANAEROBIC Blood Culture adequate volume   Culture   Final    NO GROWTH 2 DAYS Performed at [facility], [address] [place], [place] [address]    Report Status PENDING  Incomplete  Urine Culture     Status: Abnormal   Collection Time: 08/21/21  2:05 AM   Specimen: In/Out Cath Urine  Result Value Ref Range Status   Specimen Description   Final    IN/OUT CATH URINE Performed at [facility], [address], [place], [place] [address]    Special Requests   Final    NONE Performed at [facility], [address] [place], [place] [address]    Culture MULTIPLE SPECIES PRESENT, SUGGEST RECOLLECTION (A)  Final   Report Status 08/22/2021 FINAL  Final  MRSA Next Gen by PCR, Nasal     Status: None   Collection Time: 08/21/21  4:50 AM   Specimen: Nasal Mucosa; Nasal Swab  Result Value Ref Range Status   MRSA by PCR Next Gen NOT DETECTED NOT DETECTED Final    Comment: (NOTE) The GeneXpert MRSA Assay (FDA approved for NASAL specimens only), is one component of a comprehensive MRSA colonization surveillance program. It is not intended to diagnose MRSA infection nor to guide or monitor treatment for MRSA infections. Test performance is not FDA approved in patients less than 36 years old. Performed at [facility], [address] [place], [place] [address]   Respiratory (~20 pathogens)  panel by PCR     Status: None   Collection Time: 08/21/21  7:06 AM   Specimen: Nasopharyngeal Swab; Respiratory  Result Value Ref Range Status   Adenovirus NOT DETECTED NOT DETECTED Final   Coronavirus 229E NOT DETECTED NOT DETECTED Final    Comment: (NOTE) The Coronavirus on the Respiratory Panel, DOES NOT test for the novel  Coronavirus (2019 nCoV)    Coronavirus HKU1 NOT DETECTED NOT DETECTED Final   Coronavirus NL63 NOT DETECTED NOT DETECTED Final   Coronavirus OC43 NOT DETECTED NOT DETECTED Final   Metapneumovirus NOT DETECTED NOT DETECTED Final   Rhinovirus / Enterovirus NOT DETECTED NOT DETECTED Final   Influenza A NOT DETECTED NOT DETECTED Final   Influenza B NOT DETECTED NOT DETECTED Final   Parainfluenza Virus 1 NOT DETECTED NOT DETECTED Final   Parainfluenza Virus 2 NOT DETECTED NOT DETECTED Final   Parainfluenza Virus 3 NOT DETECTED NOT DETECTED Final   Parainfluenza Virus 4 NOT DETECTED NOT DETECTED Final   Respiratory Syncytial Virus NOT DETECTED NOT DETECTED Final   Bordetella pertussis NOT DETECTED NOT DETECTED Final   Bordetella Parapertussis NOT DETECTED NOT DETECTED Final   Chlamydophila pneumoniae NOT DETECTED NOT DETECTED Final   Mycoplasma pneumoniae NOT DETECTED NOT DETECTED Final    Comment: Performed at [facility], [place]. [address] [place],  [address]    Coagulation Studies: No results for input(s): LABPROT, INR in the last 72 hours.   Urinalysis: Recent Labs    08/23/21 0109  COLORURINE YELLOW  LABSPEC 1.020  PHURINE 7.0  GLUCOSEU NEGATIVE  HGBUR SMALL*  BILIRUBINUR NEGATIVE  KETONESUR NEGATIVE  PROTEINUR >300*  NITRITE NEGATIVE  LEUKOCYTESUR NEGATIVE       Imaging: CT ABDOMEN PELVIS WO CONTRAST  Result Date: 08/22/2021 CLINICAL DATA:  Abdominal distension Nausea/vomiting Abdominal pain, acute, nonlocalized EXAM: CT ABDOMEN AND PELVIS WITHOUT CONTRAST TECHNIQUE: Multidetector CT imaging of the abdomen and pelvis was  performed following the standard protocol without IV contrast. COMPARISON:  Chest x-ray 08/21/2021 FINDINGS: Lower chest: Marked peribronchovascular ground-glass airspace opacities of the visualized lung. At least small volume pericardial effusion partially visualized. Trace right pleural effusion.  Hepatobiliary: No focal liver abnormality. Likely question gallbladder wall thickening with markedly limited evaluation on this noncontrast study. No biliary dilatation. Query mild periportal edema. Pancreas: No focal lesion. Normal pancreatic contour. No surrounding inflammatory changes. No main pancreatic ductal dilatation. Spleen: Normal in size without focal abnormality. Adrenals/Urinary Tract: No adrenal nodule bilaterally. No nephrolithiasis and no hydronephrosis. No definite contour-deforming renal mass. No ureterolithiasis or hydroureter. The urinary bladder is unremarkable. Stomach/Bowel: Stomach is within normal limits. No evidence of bowel wall thickening or dilatation. Appendix appears normal. Vascular/Lymphatic: No abdominal aorta or iliac aneurysm. No abdominal, pelvic, or inguinal lymphadenopathy. Reproductive: Uterus and bilateral adnexa are unremarkable. Other: Trace free fluid within the abdomen and pelvis. No intraperitoneal free gas. No organized fluid collection. Musculoskeletal: No abdominal wall hernia or abnormality. No suspicious lytic or blastic osseous lesions. No acute displaced fracture. IMPRESSION: 1. Pulmonary findings suggestive of infection/inflammation versus edema versus alveolar hemorrhage. 2. Partially visualized at least small volume pericardial effusion. 3. Trace right pleural effusion. 4. Likely gallbladder wall thickening with markedly limited evaluation on this noncontrast study. Recommend right upper quadrant ultrasound for further evaluation. 5. Trace simple free fluid within the abdomen and pelvis. Electronically Signed   By: Iven Finn M.D.   On: 08/22/2021 23:59    PERIPHERAL VASCULAR CATHETERIZATION  Result Date: 08/23/2021 See surgical note for result.  ECHOCARDIOGRAM COMPLETE  Result Date: 08/22/2021    ECHOCARDIOGRAM REPORT   Patient Name:   [facility] Date of Exam: 08/22/2021 Medical Rec #:  [id number]    Height:       66.0 in Accession #:    [id number]   Weight:       156.3 lb Date of Birth:  10/26/2000    BSA:          1.801 m Patient Age:    20 years     BP:           149/87 mmHg Patient Gender: F            HR:           106 bpm. Exam Location:  [facility] Procedure: 2D Echo, Color Doppler and Cardiac Doppler Indications:     I42.0 Dilated cardiomyopathy  History:         Patient has prior history of Echocardiogram examinations.                  Lupus; Risk Factors:Hypertension.  Sonographer:     Charmayne Sheer Referring Phys:  [id number] BRITTON L [place] Diagnosing Phys: Ida Rogue MD  Sonographer Comments: Suboptimal apical window and suboptimal subcostal window. Image acquisition challenging due to uncooperative patient. Global longitudinal strain was attempted. IMPRESSIONS  1. Moderate pericardial effusion. The pericardial effusion is posterior and lateral to the left ventricle. There is no evidence of cardiac tamponade.  2. Left ventricular ejection fraction, by estimation, is 30 to 35%. The left ventricle has moderately decreased function. The left ventricle demonstrates global hypokinesis. There is mild left ventricular hypertrophy. Left ventricular diastolic parameters are consistent with Grade II diastolic dysfunction (pseudonormalization). The average left ventricular global longitudinal strain is -5.1 %. The global longitudinal strain is abnormal.  3. Right ventricular systolic function is normal. The right ventricular size is normal. There is normal pulmonary artery systolic pressure. The estimated right ventricular systolic pressure is 58.0 mmHg.  4. Left atrial size was moderately dilated.  5. Right atrial size was mildly dilated.  6. The  mitral valve is normal in structure. Mild mitral  valve regurgitation. No evidence of mitral stenosis.  7. Tricuspid valve regurgitation is moderate.  8. The inferior vena cava is normal in size with greater than 50% respiratory variability, suggesting right atrial pressure of 3 mmHg. FINDINGS  Left Ventricle: Left ventricular ejection fraction, by estimation, is 30 to 35%. The left ventricle has moderately decreased function. The left ventricle demonstrates global hypokinesis. The average left ventricular global longitudinal strain is -5.1 %.  The global longitudinal strain is abnormal. The left ventricular internal cavity size was normal in size. There is mild left ventricular hypertrophy. Left ventricular diastolic parameters are consistent with Grade II diastolic dysfunction (pseudonormalization). Right Ventricle: The right ventricular size is normal. No increase in right ventricular wall thickness. Right ventricular systolic function is normal. There is normal pulmonary artery systolic pressure. The tricuspid regurgitant velocity is 2.25 m/s, and  with an assumed right atrial pressure of 5 mmHg, the estimated right ventricular systolic pressure is 43.7 mmHg. Left Atrium: Left atrial size was moderately dilated. Right Atrium: Right atrial size was mildly dilated. Pericardium: A moderately sized pericardial effusion is present. The pericardial effusion is posterior and lateral to the left ventricle. There is no evidence of cardiac tamponade. Mitral Valve: The mitral valve is normal in structure. There is mild thickening of the mitral valve leaflet(s). There is mild calcification of the mitral valve leaflet(s). Mild mitral valve regurgitation. No evidence of mitral valve stenosis. MV peak gradient, 5.0 mmHg. The mean mitral valve gradient is 3.0 mmHg. Tricuspid Valve: The tricuspid valve is normal in structure. Tricuspid valve regurgitation is moderate . No evidence of tricuspid stenosis. Aortic Valve: The aortic  valve is normal in structure. Aortic valve regurgitation is not visualized. No aortic stenosis is present. Aortic valve mean gradient measures 2.0 mmHg. Aortic valve peak gradient measures 2.7 mmHg. Aortic valve area, by VTI measures 2.73 cm. Pulmonic Valve: The pulmonic valve was normal in structure. Pulmonic valve regurgitation is mild. No evidence of pulmonic stenosis. Aorta: The aortic root is normal in size and structure. Venous: The inferior vena cava is normal in size with greater than 50% respiratory variability, suggesting right atrial pressure of 3 mmHg. IAS/Shunts: No atrial level shunt detected by color flow Doppler.  LEFT VENTRICLE PLAX 2D LVIDd:         5.40 cm   Diastology LVIDs:         4.40 cm   LV e' medial:    7.51 cm/s LV PW:         1.30 cm   LV E/e' medial:  12.9 LV IVS:        1.10 cm   LV e' lateral:   8.81 cm/s LVOT diam:     2.10 cm   LV E/e' lateral: 11.0 LV SV:         33 LV SV Index:   18        2D Longitudinal Strain LVOT Area:     3.46 cm  2D Strain GLS Avg:     -5.1 %  RIGHT VENTRICLE RV Basal diam:  3.70 cm RV S prime:     7.83 cm/s LEFT ATRIUM             Index        RIGHT ATRIUM           Index LA diam:        5.00 cm 2.78 cm/m   RA Area:     20.60 cm LA Vol (A2C):  30.6 ml 16.99 ml/m  RA Volume:   55.70 ml  30.93 ml/m LA Vol (A4C):   52.4 ml 29.10 ml/m LA Biplane Vol: 44.1 ml 24.49 ml/m  AORTIC VALVE                    PULMONIC VALVE AV Area (Vmax):    3.01 cm     PV Vmax:       0.70 m/s AV Area (Vmean):   2.97 cm     PV Vmean:      47.100 cm/s AV Area (VTI):     2.73 cm     PV VTI:        0.134 m AV Vmax:           81.90 cm/s   PV Peak grad:  2.0 mmHg AV Vmean:          59.800 cm/s  PV Mean grad:  1.0 mmHg AV VTI:            0.120 m AV Peak Grad:      2.7 mmHg AV Mean Grad:      2.0 mmHg LVOT Vmax:         71.10 cm/s LVOT Vmean:        51.200 cm/s LVOT VTI:          0.095 m LVOT/AV VTI ratio: 0.79  AORTA Ao Root diam: 3.20 cm MITRAL VALVE               TRICUSPID  VALVE MV Area (PHT): 6.07 cm    TR Peak grad:   20.2 mmHg MV Area VTI:   2.75 cm    TR Vmax:        225.00 cm/s MV Peak grad:  5.0 mmHg MV Mean grad:  3.0 mmHg    SHUNTS MV Vmax:       1.12 m/s    Systemic VTI:  0.09 m MV Vmean:      70.6 cm/s   Systemic Diam: 2.10 cm MV Decel Time: 125 msec MV E velocity: 96.65 cm/s Ida Rogue MD Electronically signed by Ida Rogue MD Signature Date/Time: 08/22/2021/2:05:10 PM    Final    US Abdomen Limited RUQ (LIVER/GB)  Result Date: 08/23/2021 CLINICAL DATA:  History of chronic renal disease with gallbladder wall thickening seen on recent abdomen pelvis CT. EXAM: ULTRASOUND ABDOMEN LIMITED RIGHT UPPER QUADRANT COMPARISON:  Abdomen pelvis CT, dated August 22, 2021. FINDINGS: Gallbladder: A thin, curvilinear nonshadowing echogenic area is seen along the wall of the gallbladder fundus. No gallstones or wall thickening visualized (2.8 mm). No sonographic Murphy sign noted by sonographer. Common bile duct: Diameter: 4.2 mm Liver: No focal lesion identified. Within normal limits in parenchymal echogenicity. Portal vein is patent on color Doppler imaging with normal direction of blood flow towards the liver. Other: Of incidental note is the presence a right-sided pleural effusion and an echogenic right kidney. IMPRESSION: 1. Thin, echogenic area along the wall of the gallbladder fundus which may represent very mild gallbladder wall calcification. 2. No evidence of acute cholecystitis. Further evaluation with a nuclear medicine hepatobiliary scan is recommended if this is of clinical concern. 3. Small right pleural effusion. 4. Echogenic right kidney which may be secondary to medical renal disease related to the patient's history of chronic renal disease. Electronically Signed   By: Virgina Norfolk M.D.   On: 08/23/2021 02:15     Medications:    sodium chloride     sodium  chloride     cefTRIAXone (ROCEPHIN)  IV Stopped (08/22/21 1639)   lacosamide (VIMPAT) IV  Stopped (08/23/21 2254)    carvedilol  25 mg Oral Daily   Chlorhexidine Gluconate Cloth  6 each Topical Q0600   Chlorhexidine Gluconate Cloth  6 each Topical Q0600   chlorthalidone  50 mg Oral Daily   cloNIDine  0.3 mg Transdermal Weekly   heparin  5,000 Units Subcutaneous Q8H   hydroxychloroquine  400 mg Oral Daily   methylPREDNISolone (SOLU-MEDROL) injection  80 mg Intravenous Q12H   NIFEdipine  90 mg Oral Daily   pantoprazole (PROTONIX) IV  40 mg Intravenous Q24H   patiromer  8.4 g Oral Daily   patiromer  8.4 g Oral Once   pravastatin  20 mg Oral Daily   sodium chloride, sodium chloride, acetaminophen, alteplase, bismuth subsalicylate, docusate sodium, heparin, HYDROmorphone (DILAUDID) injection, labetalol, levalbuterol, lidocaine (PF), lidocaine-prilocaine, LORazepam, ondansetron (ZOFRAN) IV, ondansetron (ZOFRAN) IV, oxyCODONE, pentafluoroprop-tetrafluoroeth, polyethylene glycol  Assessment/ Plan:  20 y.o. 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     1.  Acute kidney injury/chronic kidney disease stage IV/class IV lupus nephritis/proteinuria.  Patient has been actively followed by [facility] nephrology.  In the past has been treated with Cytoxan.  Given her young age it was felt that further Cytoxan treatment should be avoided.  Acute kidney injury now could be secondary to progressive lupus nephritis. -Patient underwent first dialysis treatment yesterday.  She is due for second dialysis treatment today.  We will plan for third session tomorrow.  Hold off on renal biopsy for now until uremia further treated.  2.  Hyperkalemia.   Potassium down to 4.9.  We will discontinue Veltassa.  3.  Anemia of chronic kidney disease/hemolytic anemia in the setting of lupus nephritis.  He is status post blood transfusion.  Hemoglobin has trended down slightly to 7.5.  Continue to monitor.  4.  Secondary hyperparathyroidism.  Serum phosphorus 7.4 today.  Hopefully should come down with ongoing dialysis treatments.   LOS: 3 Landri Dorsainvil 11/29/20228:23 AM

## 2021-08-24 NOTE — Progress Notes (Signed)
Progress Note  Patient Name: Melinda Steele Date of Encounter: 08/24/2021  [facility] Cardiologist: [facility]  Subjective   Patient on HD this AM (second session).  She reports mild lower abdominal pain.  No chest pain, shortness of breath, palpitations, or edema.  Inpatient Medications    Scheduled Meds:  carvedilol  25 mg Oral Daily   Chlorhexidine Gluconate Cloth  6 each Topical Q0600   Chlorhexidine Gluconate Cloth  6 each Topical Q0600   chlorthalidone  50 mg Oral Daily   cloNIDine  0.3 mg Transdermal Weekly   heparin  5,000 Units Subcutaneous Q8H   hydroxychloroquine  400 mg Oral Daily   methylPREDNISolone (SOLU-MEDROL) injection  80 mg Intravenous Q12H   NIFEdipine  90 mg Oral Daily   pantoprazole (PROTONIX) IV  40 mg Intravenous Q24H   patiromer  8.4 g Oral Once   pravastatin  20 mg Oral Daily   Continuous Infusions:  sodium chloride     sodium chloride     cefTRIAXone (ROCEPHIN)  IV Stopped (08/22/21 1639)   lacosamide (VIMPAT) IV Stopped (08/23/21 2254)   PRN Meds: sodium chloride, sodium chloride, acetaminophen, alteplase, bismuth subsalicylate, docusate sodium, heparin, HYDROmorphone (DILAUDID) injection, labetalol, levalbuterol, lidocaine (PF), lidocaine-prilocaine, LORazepam, ondansetron (ZOFRAN) IV, ondansetron (ZOFRAN) IV, oxyCODONE, pentafluoroprop-tetrafluoroeth, polyethylene glycol   Vital Signs    Vitals:   08/24/21 0932 08/24/21 0945 08/24/21 1000 08/24/21 1015  BP: (!) 151/120 (!) 154/126 (!) 161/123 (!) 159/125  Pulse: (!) 104 (!) 115 (!) 106 (!) 106  Resp: (!) 25 17 17  (!) 21  Temp:      TempSrc:      SpO2: 99% 98% 100% 98%  Weight:      Height:        Intake/Output Summary (Last 24 hours) at 08/24/2021 1031 Last data filed at 08/24/2021 0700 Gross per 24 hour  Intake 677.94 ml  Output 0 ml  Net 677.94 ml   Last 3 Weights 08/24/2021 08/23/2021 08/22/2021  Weight (lbs) 146 lb 6.2 oz 155 lb 6.8 oz 156 lb 4.9 oz  Weight (kg) 66.4 kg 70.5  kg 70.9 kg      Telemetry    Sinus tachycardia - Personally Reviewed  ECG    No new tracing  Physical Exam   GEN: No acute distress.   Neck: No JVD Cardiac: RRR, no murmurs, rubs, or gallops.  Respiratory: Clear to auscultation bilaterally. GI: Soft, nontender, non-distended  MS: No edema; No deformity. Neuro:  Nonfocal  Psych: Normal affect   Labs    High Sensitivity Troponin:   Recent Labs  Lab 08/21/21 0101 08/21/21 0531 08/21/21 0957  TROPONINIHS 538* 619* 645*     Chemistry Recent Labs  Lab 08/21/21 0100 08/21/21 0531 08/22/21 0409 08/22/21 2008 08/23/21 0548 08/23/21 1110 08/24/21 0416  NA 137   < > 137  --  135  --  134*  K 5.1   < > 6.1*   < > 5.7* 5.6* 4.9  CL 110   < > 106  --  106  --  101  CO2 17*   < > 20*  --  18*  --  21*  GLUCOSE 80   < > 133*  --  142*  --  179*  BUN 86*   < > 97*  --  89*  --  89*  CREATININE 7.89*   < > 8.78*  --  9.11*  --  8.17*  CALCIUM 7.7*   < > 7.7*  --  8.2*  --  7.8*  MG  --    < > 2.9*  --  2.7*  --  2.3  PROT 6.2*  --   --   --  6.1*  --   --   ALBUMIN 3.2*  --   --   --  2.7*  --  2.8*  AST 20  --   --   --  22  --   --   ALT 15  --   --   --  17  --   --   ALKPHOS 43  --   --   --  48  --   --   BILITOT 0.6  --   --   --  0.9  --   --   GFRNONAA 7*   < > 6*  --  6*  --  7*  ANIONGAP 10   < > 11  --  11  --  12   < > = values in this interval not displayed.    Lipids  Recent Labs  Lab 08/21/21 0531  CHOL 129  TRIG 102  HDL 42  LDLCALC 67  CHOLHDL 3.1    Hematology Recent Labs  Lab 08/22/21 0409 08/22/21 1626 08/23/21 0548 08/24/21 0416  WBC 32.8*  --  21.0* 9.3  RBC 2.10*  --  2.80* 2.61*  HGB 5.9* 7.8* 8.1* 7.5*  HCT 18.2* 23.2* 23.4* 21.9*  MCV 86.7  --  83.6 83.9  MCH 28.1  --  28.9 28.7  MCHC 32.4  --  34.6 34.2  RDW 13.2  --  14.0 13.9  PLT 120*  --  101* 110*   Thyroid No results for input(s): TSH, FREET4 in the last 168 hours.  BNP Recent Labs  Lab 08/21/21 0101  08/21/21 0531  BNP >4,500.0* >4,500.0*    DDimer No results for input(s): DDIMER in the last 168 hours.   Radiology    CT ABDOMEN PELVIS WO CONTRAST  Result Date: 08/22/2021 CLINICAL DATA:  Abdominal distension Nausea/vomiting Abdominal pain, acute, nonlocalized EXAM: CT ABDOMEN AND PELVIS WITHOUT CONTRAST TECHNIQUE: Multidetector CT imaging of the abdomen and pelvis was performed following the standard protocol without IV contrast. COMPARISON:  Chest x-ray 08/21/2021 FINDINGS: Lower chest: Marked peribronchovascular ground-glass airspace opacities of the visualized lung. At least small volume pericardial effusion partially visualized. Trace right pleural effusion. Hepatobiliary: No focal liver abnormality. Likely question gallbladder wall thickening with markedly limited evaluation on this noncontrast study. No biliary dilatation. Query mild periportal edema. Pancreas: No focal lesion. Normal pancreatic contour. No surrounding inflammatory changes. No main pancreatic ductal dilatation. Spleen: Normal in size without focal abnormality. Adrenals/Urinary Tract: No adrenal nodule bilaterally. No nephrolithiasis and no hydronephrosis. No definite contour-deforming renal mass. No ureterolithiasis or hydroureter. The urinary bladder is unremarkable. Stomach/Bowel: Stomach is within normal limits. No evidence of bowel wall thickening or dilatation. Appendix appears normal. Vascular/Lymphatic: No abdominal aorta or iliac aneurysm. No abdominal, pelvic, or inguinal lymphadenopathy. Reproductive: Uterus and bilateral adnexa are unremarkable. Other: Trace free fluid within the abdomen and pelvis. No intraperitoneal free gas. No organized fluid collection. Musculoskeletal: No abdominal wall hernia or abnormality. No suspicious lytic or blastic osseous lesions. No acute displaced fracture. IMPRESSION: 1. Pulmonary findings suggestive of infection/inflammation versus edema versus alveolar hemorrhage. 2. Partially  visualized at least small volume pericardial effusion. 3. Trace right pleural effusion. 4. Likely gallbladder wall thickening with markedly limited evaluation on this noncontrast study. Recommend right upper quadrant ultrasound  for further evaluation. 5. Trace simple free fluid within the abdomen and pelvis. Electronically Signed   By: Iven Finn M.D.   On: 08/22/2021 23:59   PERIPHERAL VASCULAR CATHETERIZATION  Result Date: 08/23/2021 See surgical note for result.  ECHOCARDIOGRAM COMPLETE  Result Date: 08/22/2021    ECHOCARDIOGRAM REPORT   Patient Name:   [facility] Date of Exam: 08/22/2021 Medical Rec #:  [id number]    Height:       66.0 in Accession #:    [id number]   Weight:       156.3 lb Date of Birth:  Sep 24, 2001    BSA:          1.801 m Patient Age:    20 years     BP:           149/87 mmHg Patient Gender: F            HR:           106 bpm. Exam Location:  [facility] Procedure: 2D Echo, Color Doppler and Cardiac Doppler Indications:     I42.0 Dilated cardiomyopathy  History:         Patient has prior history of Echocardiogram examinations.                  Lupus; Risk Factors:Hypertension.  Sonographer:     Charmayne Sheer Referring Phys:  [id number] BRITTON L R[place]CHE[place] Diagnosing Phys: Ida Rogue MD  Sonographer Comments: Suboptimal apical window and suboptimal subcostal window. Image acquisition challenging due to uncooperative patient. Global longitudinal strain was attempted. IMPRESSIONS  1. Moderate pericardial effusion. The pericardial effusion is posterior and lateral to the left ventricle. There is no evidence of cardiac tamponade.  2. Left ventricular ejection fraction, by estimation, is 30 to 35%. The left ventricle has moderately decreased function. The left ventricle demonstrates global hypokinesis. There is mild left ventricular hypertrophy. Left ventricular diastolic parameters are consistent with Grade II diastolic dysfunction (pseudonormalization). The average left ventricular  global longitudinal strain is -5.1 %. The global longitudinal strain is abnormal.  3. Right ventricular systolic function is normal. The right ventricular size is normal. There is normal pulmonary artery systolic pressure. The estimated right ventricular systolic pressure is 40.1 mmHg.  4. Left atrial size was moderately dilated.  5. Right atrial size was mildly dilated.  6. The mitral valve is normal in structure. Mild mitral valve regurgitation. No evidence of mitral stenosis.  7. Tricuspid valve regurgitation is moderate.  8. The inferior vena cava is normal in size with greater than 50% respiratory variability, suggesting right atrial pressure of 3 mmHg. FINDINGS  Left Ventricle: Left ventricular ejection fraction, by estimation, is 30 to 35%. The left ventricle has moderately decreased function. The left ventricle demonstrates global hypokinesis. The average left ventricular global longitudinal strain is -5.1 %.  The global longitudinal strain is abnormal. The left ventricular internal cavity size was normal in size. There is mild left ventricular hypertrophy. Left ventricular diastolic parameters are consistent with Grade II diastolic dysfunction (pseudonormalization). Right Ventricle: The right ventricular size is normal. No increase in right ventricular wall thickness. Right ventricular systolic function is normal. There is normal pulmonary artery systolic pressure. The tricuspid regurgitant velocity is 2.25 m/s, and  with an assumed right atrial pressure of 5 mmHg, the estimated right ventricular systolic pressure is 02.7 mmHg. Left Atrium: Left atrial size was moderately dilated. Right Atrium: Right atrial size was mildly dilated. Pericardium: A moderately sized pericardial effusion is present. The pericardial  effusion is posterior and lateral to the left ventricle. There is no evidence of cardiac tamponade. Mitral Valve: The mitral valve is normal in structure. There is mild thickening of the mitral valve  leaflet(s). There is mild calcification of the mitral valve leaflet(s). Mild mitral valve regurgitation. No evidence of mitral valve stenosis. MV peak gradient, 5.0 mmHg. The mean mitral valve gradient is 3.0 mmHg. Tricuspid Valve: The tricuspid valve is normal in structure. Tricuspid valve regurgitation is moderate . No evidence of tricuspid stenosis. Aortic Valve: The aortic valve is normal in structure. Aortic valve regurgitation is not visualized. No aortic stenosis is present. Aortic valve mean gradient measures 2.0 mmHg. Aortic valve peak gradient measures 2.7 mmHg. Aortic valve area, by VTI measures 2.73 cm. Pulmonic Valve: The pulmonic valve was normal in structure. Pulmonic valve regurgitation is mild. No evidence of pulmonic stenosis. Aorta: The aortic root is normal in size and structure. Venous: The inferior vena cava is normal in size with greater than 50% respiratory variability, suggesting right atrial pressure of 3 mmHg. IAS/Shunts: No atrial level shunt detected by color flow Doppler.  LEFT VENTRICLE PLAX 2D LVIDd:         5.40 cm   Diastology LVIDs:         4.40 cm   LV e' medial:    7.51 cm/s LV PW:         1.30 cm   LV E/e' medial:  12.9 LV IVS:        1.10 cm   LV e' lateral:   8.81 cm/s LVOT diam:     2.10 cm   LV E/e' lateral: 11.0 LV SV:         33 LV SV Index:   18        2D Longitudinal Strain LVOT Area:     3.46 cm  2D Strain GLS Avg:     -5.1 %  RIGHT VENTRICLE RV Basal diam:  3.70 cm RV S prime:     7.83 cm/s LEFT ATRIUM             Index        RIGHT ATRIUM           Index LA diam:        5.00 cm 2.78 cm/m   RA Area:     20.60 cm LA Vol (A2C):   30.6 ml 16.99 ml/m  RA Volume:   55.70 ml  30.93 ml/m LA Vol (A4C):   52.4 ml 29.10 ml/m LA Biplane Vol: 44.1 ml 24.49 ml/m  AORTIC VALVE                    PULMONIC VALVE AV Area (Vmax):    3.01 cm     PV Vmax:       0.70 m/s AV Area (Vmean):   2.97 cm     PV Vmean:      47.100 cm/s AV Area (VTI):     2.73 cm     PV VTI:        0.134  m AV Vmax:           81.90 cm/s   PV Peak grad:  2.0 mmHg AV Vmean:          59.800 cm/s  PV Mean grad:  1.0 mmHg AV VTI:            0.120 m AV Peak Grad:      2.7 mmHg AV Mean  Grad:      2.0 mmHg LVOT Vmax:         71.10 cm/s LVOT Vmean:        51.200 cm/s LVOT VTI:          0.095 m LVOT/AV VTI ratio: 0.79  AORTA Ao Root diam: 3.20 cm MITRAL VALVE               TRICUSPID VALVE MV Area (PHT): 6.07 cm    TR Peak grad:   20.2 mmHg MV Area VTI:   2.75 cm    TR Vmax:        225.00 cm/s MV Peak grad:  5.0 mmHg MV Mean grad:  3.0 mmHg    SHUNTS MV Vmax:       1.12 m/s    Systemic VTI:  0.09 m MV Vmean:      70.6 cm/s   Systemic Diam: 2.10 cm MV Decel Time: 125 msec MV E velocity: 96.65 cm/s Ida Rogue MD Electronically signed by Ida Rogue MD Signature Date/Time: 08/22/2021/2:05:10 PM    Final    US Abdomen Limited RUQ (LIVER/GB)  Result Date: 08/23/2021 CLINICAL DATA:  History of chronic renal disease with gallbladder wall thickening seen on recent abdomen pelvis CT. EXAM: ULTRASOUND ABDOMEN LIMITED RIGHT UPPER QUADRANT COMPARISON:  Abdomen pelvis CT, dated August 22, 2021. FINDINGS: Gallbladder: A thin, curvilinear nonshadowing echogenic area is seen along the wall of the gallbladder fundus. No gallstones or wall thickening visualized (2.8 mm). No sonographic Murphy sign noted by sonographer. Common bile duct: Diameter: 4.2 mm Liver: No focal lesion identified. Within normal limits in parenchymal echogenicity. Portal vein is patent on color Doppler imaging with normal direction of blood flow towards the liver. Other: Of incidental note is the presence a right-sided pleural effusion and an echogenic right kidney. IMPRESSION: 1. Thin, echogenic area along the wall of the gallbladder fundus which may represent very mild gallbladder wall calcification. 2. No evidence of acute cholecystitis. Further evaluation with a nuclear medicine hepatobiliary scan is recommended if this is of clinical concern. 3. Small  right pleural effusion. 4. Echogenic right kidney which may be secondary to medical renal disease related to the patient's history of chronic renal disease. Electronically Signed   By: Virgina Norfolk M.D.   On: 08/23/2021 02:15    Cardiac Studies   TTE (08/22/2021):  1. Moderate pericardial effusion. The pericardial effusion is posterior  and lateral to the left ventricle. There is no evidence of cardiac  tamponade.   2. Left ventricular ejection fraction, by estimation, is 30 to 35%. The  left ventricle has moderately decreased function. The left ventricle  demonstrates global hypokinesis. There is mild left ventricular  hypertrophy. Left ventricular diastolic  parameters are consistent with Grade II diastolic dysfunction  (pseudonormalization). The average left ventricular global longitudinal  strain is -5.1 %. The global longitudinal strain is abnormal.   3. Right ventricular systolic function is normal. The right ventricular  size is normal. There is normal pulmonary artery systolic pressure. The  estimated right ventricular systolic pressure is 27.7 mmHg.   4. Left atrial size was moderately dilated.   5. Right atrial size was mildly dilated.   6. The mitral valve is normal in structure. Mild mitral valve  regurgitation. No evidence of mitral stenosis.   7. Tricuspid valve regurgitation is moderate.   8. The inferior vena cava is normal in size with greater than 50%  respiratory variability, suggesting right atrial pressure of 3 mmHg.  Patient Profile     20 y.o. female with a hx of seizures, lupus, chronic systolic heart failure, glomerulonephritis, CKD 3B, hemolytic anemia and chronic pericardial effusions who is being seen 08/22/2021 for the evaluation of pericardial effusion and chronic HFrEF.  Assessment & Plan    Pericardial effusion: Fluid collection is primarily posterior and moderate in size.  No evidence of tamponade on echo.  Effusion also not easily approachable  from percutaneous approach.  Query chronic inflammation and possibly a component of uremia. - Continue HD. - No role for chronic antiinflammatory medication (i.e. colchicine or NSAIDs) at this time.  Chronic HFrEF: - Volume management per HD. - Increase carvedilol to 25 mg BID. - Start BiDiL.  If HD becomes permanent, would favor challenging with ARB/Entresto.  Hypertension: BP suboptimally controlled. - Increase carvedilol to 25 mg BID. - Start BiDil 1 tab TID.  Lupus: - Per IM.  AKI superimposed on CKD: Thought to be due to SLE. - Per nephrology.  Anemia: Slight decline in hemoglobin.  No evidence of active bleeding. - Per IM/nephrology.  For questions or updates, please contact [place] Please consult [email or web address] for contact info under [facility].    Signed, Nelva Bush, MD  08/24/2021, 10:31 AM

## 2021-08-25 ENCOUNTER — Encounter: Payer: Self-pay | Admitting: Vascular Surgery

## 2021-08-25 DIAGNOSIS — I3139 Other pericardial effusion (noninflammatory): Secondary | ICD-10-CM | POA: Diagnosis not present

## 2021-08-25 DIAGNOSIS — I5022 Chronic systolic (congestive) heart failure: Secondary | ICD-10-CM | POA: Diagnosis not present

## 2021-08-25 LAB — CBC WITH DIFFERENTIAL/PLATELET
Abs Immature Granulocytes: 0.15 10*3/uL — ABNORMAL HIGH (ref 0.00–0.07)
Basophils Absolute: 0 10*3/uL (ref 0.0–0.1)
Basophils Relative: 0 %
Eosinophils Absolute: 0 10*3/uL (ref 0.0–0.5)
Eosinophils Relative: 0 %
HCT: 23.5 % — ABNORMAL LOW (ref 36.0–46.0)
Hemoglobin: 7.8 g/dL — ABNORMAL LOW (ref 12.0–15.0)
Immature Granulocytes: 1 %
Lymphocytes Relative: 2 %
Lymphs Abs: 0.2 10*3/uL — ABNORMAL LOW (ref 0.7–4.0)
MCH: 28.6 pg (ref 26.0–34.0)
MCHC: 33.2 g/dL (ref 30.0–36.0)
MCV: 86.1 fL (ref 80.0–100.0)
Monocytes Absolute: 0.5 10*3/uL (ref 0.1–1.0)
Monocytes Relative: 5 %
Neutro Abs: 9.8 10*3/uL — ABNORMAL HIGH (ref 1.7–7.7)
Neutrophils Relative %: 92 %
Platelets: 100 10*3/uL — ABNORMAL LOW (ref 150–400)
RBC: 2.73 MIL/uL — ABNORMAL LOW (ref 3.87–5.11)
RDW: 13.2 % (ref 11.5–15.5)
WBC: 10.7 10*3/uL — ABNORMAL HIGH (ref 4.0–10.5)
nRBC: 0.3 % — ABNORMAL HIGH (ref 0.0–0.2)

## 2021-08-25 LAB — GLUCOSE, CAPILLARY
Glucose-Capillary: 136 mg/dL — ABNORMAL HIGH (ref 70–99)
Glucose-Capillary: 137 mg/dL — ABNORMAL HIGH (ref 70–99)
Glucose-Capillary: 140 mg/dL — ABNORMAL HIGH (ref 70–99)
Glucose-Capillary: 147 mg/dL — ABNORMAL HIGH (ref 70–99)

## 2021-08-25 LAB — COMPREHENSIVE METABOLIC PANEL
ALT: 24 U/L (ref 0–44)
AST: 21 U/L (ref 15–41)
Albumin: 2.8 g/dL — ABNORMAL LOW (ref 3.5–5.0)
Alkaline Phosphatase: 69 U/L (ref 38–126)
Anion gap: 8 (ref 5–15)
BUN: 76 mg/dL — ABNORMAL HIGH (ref 6–20)
CO2: 26 mmol/L (ref 22–32)
Calcium: 7.6 mg/dL — ABNORMAL LOW (ref 8.9–10.3)
Chloride: 100 mmol/L (ref 98–111)
Creatinine, Ser: 6.18 mg/dL — ABNORMAL HIGH (ref 0.44–1.00)
GFR, Estimated: 9 mL/min — ABNORMAL LOW (ref >60)
Glucose, Bld: 163 mg/dL — ABNORMAL HIGH (ref 70–99)
Potassium: 4.5 mmol/L (ref 3.5–5.1)
Sodium: 134 mmol/L — ABNORMAL LOW (ref 135–145)
Total Bilirubin: 1 mg/dL (ref 0.3–1.2)
Total Protein: 5.5 g/dL — ABNORMAL LOW (ref 6.5–8.1)

## 2021-08-25 LAB — RENAL FUNCTION PANEL
Albumin: 2.7 g/dL — ABNORMAL LOW (ref 3.5–5.0)
Anion gap: 9 (ref 5–15)
BUN: 76 mg/dL — ABNORMAL HIGH (ref 6–20)
CO2: 25 mmol/L (ref 22–32)
Calcium: 7.5 mg/dL — ABNORMAL LOW (ref 8.9–10.3)
Chloride: 99 mmol/L (ref 98–111)
Creatinine, Ser: 6.09 mg/dL — ABNORMAL HIGH (ref 0.44–1.00)
GFR, Estimated: 9 mL/min — ABNORMAL LOW (ref >60)
Glucose, Bld: 166 mg/dL — ABNORMAL HIGH (ref 70–99)
Phosphorus: 5.1 mg/dL — ABNORMAL HIGH (ref 2.5–4.6)
Potassium: 4.5 mmol/L (ref 3.5–5.1)
Sodium: 133 mmol/L — ABNORMAL LOW (ref 135–145)

## 2021-08-25 LAB — ANTI-DNA ANTIBODY, DOUBLE-STRANDED: ds DNA Ab: 11 IU/mL — ABNORMAL HIGH (ref 0–9)

## 2021-08-25 MED ORDER — HEPARIN SODIUM (PORCINE) 1000 UNIT/ML IJ SOLN
INTRAMUSCULAR | Status: AC
  Filled 2021-08-25: qty 10

## 2021-08-25 MED ORDER — NEPRO/CARBSTEADY PO LIQD
237.0000 mL | Freq: Three times a day (TID) | ORAL | Status: DC
  Administered 2021-08-25 – 2021-08-26 (×4): 237 mL via ORAL

## 2021-08-25 MED ORDER — RENA-VITE PO TABS
1.0000 | ORAL_TABLET | Freq: Every day | ORAL | Status: DC
  Administered 2021-08-25 – 2021-08-28 (×3): 1 via ORAL
  Filled 2021-08-25 (×5): qty 1

## 2021-08-25 MED ORDER — ISOSORBIDE DINITRATE 20 MG PO TABS
20.0000 mg | ORAL_TABLET | Freq: Three times a day (TID) | ORAL | Status: DC
Start: 2021-08-25 — End: 2021-08-25
  Administered 2021-08-25: 20 mg via ORAL
  Filled 2021-08-25 (×3): qty 1

## 2021-08-25 MED ORDER — SPIRONOLACTONE 25 MG PO TABS
12.5000 mg | ORAL_TABLET | Freq: Every day | ORAL | Status: DC
  Administered 2021-08-26: 11:00:00 12.5 mg via ORAL
  Filled 2021-08-25 (×3): qty 0.5
  Filled 2021-08-25: qty 1

## 2021-08-25 NOTE — Progress Notes (Signed)
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  GOAL:   Patient will meet greater than or equal to 90% of their needs  MONITOR:   PO intake, Supplement acceptance, Labs, Weight trends, Skin, I & O's  REASON FOR ASSESSMENT:   Consult Assessment of nutrition requirement/status, Diet education  ASSESSMENT:   20 y.o. 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  Met with pt in room today. Pt reports good appetite and oral intake at baseline but reports that her oral intake is decreased in hospital because she does not like the hospital food. Pt documented to have eaten 100% of her breakfast this morning. Per chart, pt appears weight stable at baseline. Pt initiated on dialysis 11/28 and is tolerating well. RD discussed with pt the importance of adequate nutrition needed to preserve lean muscle in the setting of new HD. Pt is willing to try Nepro supplements in hospital. RD will add supplements and vitamins to help pt meet her estimated needs and replaced loses from HD. RD also provided pt with renal diet education today.   Provided "Low sodium Nutrition Therapy" handout from the Academy of [employer]. Reviewed food groups and provided written recommended serving sizes specifically determined for patient's current nutritional status.    Explained why diet restrictions are needed and provided lists of foods to limit/avoid that are high potassium, sodium, and phosphorus. Provided specific recommendations on safer alternatives of these foods. Strongly encouraged compliance of this diet.   Discussed importance of protein intake at each meal and snack. Provided examples of how to maximize protein intake throughout the day. Discussed the possible need for fluid restriction with dialysis, importance of minimizing weight gain between HD treatments and renal-friendly beverage options.  Encouraged pt to take a daily renal multivitamin   Teach back method used.  Medications reviewed and include: heparin, solu-medrol, protonix, aldactone, ceftriaxone  Labs reviewed: Na 134(L), K 4.5 wnl, BUN 76(H), creat 6.18(H), P 5.1(H) Wbc- 10.7(H), Hgb 7.8(L), Hct 23.5(L) Cbgs- 136, 140, 137, 147 x 24 hrs  NUTRITION - FOCUSED PHYSICAL EXAM:  Flowsheet Row Most Recent Value  Orbital Region No depletion  Upper Arm Region No depletion  Thoracic and Lumbar Region No depletion  Buccal Region No depletion  Temple Region No depletion  Clavicle Bone Region No depletion  Clavicle and Acromion Bone Region No depletion  Scapular Bone Region No depletion  Dorsal Hand No depletion  Patellar Region No depletion  Anterior Thigh Region No depletion  Posterior Calf Region No depletion  Edema (RD Assessment) None  Hair Reviewed  Eyes Reviewed  Mouth Reviewed  Skin Reviewed  Nails Reviewed   Diet Order:    Diet Order             Diet renal with fluid restriction Fluid restriction: 1200 mL Fluid; Room service appropriate? Yes; Fluid consistency: Thin  Diet effective now  EDUCATION NEEDS:   Education needs have been addressed  Skin:  Skin Assessment: Reviewed RN Assessment  Last BM:  11/29  Height:   Ht Readings from Last 1 Encounters:  08/21/21 _0  (1.676 m)    Weight:   Wt Readings from Last 1 Encounters:   08/24/21 65.9 kg    Ideal Body Weight:  59 kg  BMI:  Body mass index is 23.45 kg/m.  Estimated Nutritional Needs:   Kcal:  1900-2200kcal/day  Protein:  95-110g/day  Fluid:  UOP +1L  Koleen Distance MS, RD, LDN Please refer to [facility] for RD and/or RD on-call/weekend/after hours pager

## 2021-08-25 NOTE — Progress Notes (Signed)
Patient tolerated treatment without incident, CVC functioned adequately maintaining prescribed BFR. UF target met, 0.5 liter removed. Patient returned to ICU.

## 2021-08-25 NOTE — Progress Notes (Signed)
1350 patient transported to Dialysis on bed with transport gave report to dialysis RN via phone family at bedside

## 2021-08-25 NOTE — Progress Notes (Signed)
[facility]  ROUNDING NOTE   Subjective:  Patient much more awake and alert today. She had several salient questions regarding her renal failure at the moment. Patient's mother also at bedside this AM. She is due for her third dialysis session today. She has tolerated initiation of dialysis well so far.   Objective:  Vital signs in last 24 hours:  Temp:  [98.3 F (36.8 C)-99.1 F (37.3 C)] 98.3 F (36.8 C) (11/30 0800) Pulse Rate:  [95-111] 95 (11/30 0900) Resp:  [14-38] 16 (11/30 0900) BP: (115-185)/(79-127) 115/79 (11/30 0900) SpO2:  [88 %-100 %] 98 % (11/30 0900) Weight:  [65.9 kg] 65.9 kg (11/29 1230)  Weight change:  Filed Weights   08/23/21 0500 08/24/21 0930 08/24/21 1230  Weight: 70.5 kg 66.4 kg 65.9 kg    Intake/Output: I/O last 3 completed shifts: In: 1634 [P.O.:1454; IV Piggyback:180] Out: 0    Intake/Output this shift:  No intake/output data recorded.  Physical Exam: General: No acute distress  Head: Normocephalic, atraumatic. Moist oral mucosal membranes  Eyes: Anicteric  Neck: Supple  Lungs:  Clear to auscultation, normal effort  Heart: S1S2 no obvious rubs  Abdomen:  Soft, nontender, bowel sounds present  Extremities: No peripheral edema.  Neurologic: Awake, alert, conversant.  Skin: No acute rash  Access: Right IJ PermCath    Basic Metabolic Panel: Recent Labs  Lab 08/21/21 0531 08/22/21 0409 08/22/21 2008 08/23/21 0548 08/23/21 1110 08/24/21 0416 08/25/21 0414  NA 139 137  --  135  --  134* 134*  133*  K 5.1 6.1* 5.8* 5.7* 5.6* 4.9 4.5  4.5  CL 112* 106  --  106  --  101 100  99  CO2 19* 20*  --  18*  --  21* 26  25  GLUCOSE 157* 133*  --  142*  --  179* 163*  166*  BUN 88* 97*  --  89*  --  89* 76*  76*  CREATININE 7.90* 8.78*  --  9.11*  --  8.17* 6.18*  6.09*  CALCIUM 7.3* 7.7*  --  8.2*  --  7.8* 7.6*  7.5*  MG 1.7 2.9*  --  2.7*  --  2.3  --   PHOS 4.4 5.8*  --  6.7* 7.2* 7.4*  7.4* 5.1*     Liver  Function Tests: Recent Labs  Lab 08/21/21 0100 08/23/21 0548 08/24/21 0416 08/25/21 0414  AST 20 22  --  21  ALT 15 17  --  24  ALKPHOS 43 48  --  69  BILITOT 0.6 0.9  --  1.0  PROT 6.2* 6.1*  --  5.5*  ALBUMIN 3.2* 2.7* 2.8* 2.8*  2.7*    No results for input(s): LIPASE, AMYLASE in the last 168 hours. No results for input(s): AMMONIA in the last 168 hours.  CBC: Recent Labs  Lab 08/21/21 0100 08/21/21 0531 08/21/21 0957 08/22/21 0409 08/22/21 1626 08/23/21 0548 08/24/21 0416 08/25/21 0414  WBC 20.1* 13.6*  --  32.8*  --  21.0* 9.3 10.7*  NEUTROABS 18.5*  --   --   --   --   --  8.6* 9.8*  HGB 7.4* 6.3*   < > 5.9* 7.8* 8.1* 7.5* 7.8*  HCT 23.4* 20.1*   < > 18.2* 23.2* 23.4* 21.9* 23.5*  MCV 87.6 87.0  --  86.7  --  83.6 83.9 86.1  PLT 188 128*  --  120*  --  101* 110* 100*   < > =  values in this interval not displayed.     Cardiac Enzymes: Recent Labs  Lab 08/21/21 0957  CKTOTAL 75     BNP: Invalid input(s): POCBNP  CBG: Recent Labs  Lab 08/24/21 1651 08/24/21 1938 08/25/21 0025 08/25/21 0322 08/25/21 0803  GLUCAP 189* 134* 147* 137* 140*     Microbiology: Results for orders placed or performed during the hospital encounter of 08/21/21  Resp Panel by RT-PCR (Flu A&B, Covid) Nasopharyngeal Swab     Status: None   Collection Time: 08/21/21  1:00 AM   Specimen: Nasopharyngeal Swab; Nasopharyngeal(NP) swabs in vial transport medium  Result Value Ref Range Status   SARS Coronavirus 2 by RT PCR NEGATIVE NEGATIVE Final    Comment: (NOTE) SARS-CoV-2 target nucleic acids are NOT DETECTED.  The SARS-CoV-2 RNA is generally detectable in upper respiratory specimens during the acute phase of infection. The lowest concentration of SARS-CoV-2 viral copies this assay can detect is 138 copies/mL. A negative result does not preclude SARS-Cov-2 infection and should not be used as the sole basis for treatment or other patient management decisions. A negative  result may occur with  improper specimen collection/handling, submission of specimen other than nasopharyngeal swab, presence of viral mutation(s) within the areas targeted by this assay, and inadequate number of viral copies(<138 copies/mL). A negative result must be combined with clinical observations, patient history, and epidemiological information. The expected result is Negative.  Fact Sheet for Patients:  EntrepreneurPulse.com.au  Fact Sheet for Healthcare Providers:  IncredibleEmployment.be  This test is no t yet approved or cleared by the [place] FDA and  has been authorized for detection and/or diagnosis of SARS-CoV-2 by FDA under an Emergency Use Authorization (EUA). This EUA will remain  in effect (meaning this test can be used) for the duration of the COVID-19 declaration under Section 564(b)(1) of the Act, 21 U.S.C.section 360bbb-3(b)(1), unless the authorization is terminated  or revoked sooner.       Influenza A by PCR NEGATIVE NEGATIVE Final   Influenza B by PCR NEGATIVE NEGATIVE Final    Comment: (NOTE) The Xpert Xpress SARS-CoV-2/FLU/RSV plus assay is intended as an aid in the diagnosis of influenza from Nasopharyngeal swab specimens and should not be used as a sole basis for treatment. Nasal washings and aspirates are unacceptable for Xpert Xpress SARS-CoV-2/FLU/RSV testing.  Fact Sheet for Patients: EntrepreneurPulse.com.au  Fact Sheet for Healthcare Providers: IncredibleEmployment.be  This test is not yet approved or cleared by the [place] FDA and has been authorized for detection and/or diagnosis of SARS-CoV-2 by FDA under an Emergency Use Authorization (EUA). This EUA will remain in effect (meaning this test can be used) for the duration of the COVID-19 declaration under Section 564(b)(1) of the Act, 21 U.S.C. section 360bbb-3(b)(1), unless the authorization is  terminated or revoked.  Performed at [facility], [address] [place], [place] [address]   Blood Culture (routine x 2)     Status: None (Preliminary result)   Collection Time: 08/21/21  1:04 AM   Specimen: BLOOD  Result Value Ref Range Status   Specimen Description BLOOD RIGHT FOREARM  Final   Special Requests   Final    BOTTLES DRAWN AEROBIC AND ANAEROBIC Blood Culture results may not be optimal due to an inadequate volume of blood received in culture bottles   Culture   Final    NO GROWTH 4 DAYS Performed at [facility], [address] [place], [place] [address]    Report Status PENDING  Incomplete  Blood Culture (routine x 2)     Status: None (Preliminary result)   Collection Time: 08/21/21  1:05 AM   Specimen: BLOOD  Result Value Ref Range Status   Specimen Description BLOOD RIGHT HAND  Final   Special Requests   Final    BOTTLES DRAWN AEROBIC AND ANAEROBIC Blood Culture adequate volume   Culture   Final    NO GROWTH 4 DAYS Performed at [facility], [address] [place], [place] [address]    Report Status PENDING  Incomplete  Urine Culture     Status: Abnormal   Collection Time: 08/21/21  2:05 AM   Specimen: In/Out Cath Urine  Result Value Ref Range Status   Specimen Description   Final    IN/OUT CATH URINE Performed at [facility], [address], [place], [place] [address]    Special Requests   Final    NONE Performed at [facility], [address] [place], [place] [address]    Culture MULTIPLE SPECIES PRESENT, SUGGEST RECOLLECTION (A)  Final   Report Status 08/22/2021 FINAL  Final  MRSA Next Gen by PCR, Nasal     Status: None   Collection Time: 08/21/21  4:50 AM   Specimen: Nasal Mucosa; Nasal Swab  Result Value Ref Range Status   MRSA by PCR Next Gen NOT DETECTED NOT DETECTED Final    Comment: (NOTE) The GeneXpert MRSA Assay (FDA approved for NASAL specimens only), is one component of a  comprehensive MRSA colonization surveillance program. It is not intended to diagnose MRSA infection nor to guide or monitor treatment for MRSA infections. Test performance is not FDA approved in patients less than 22 years old. Performed at [facility], [address] [place], [place] [address]   Respiratory (~20 pathogens) panel by PCR     Status: None   Collection Time: 08/21/21  7:06 AM   Specimen: Nasopharyngeal Swab; Respiratory  Result Value Ref Range Status   Adenovirus NOT DETECTED NOT DETECTED Final   Coronavirus 229E NOT DETECTED NOT DETECTED Final    Comment: (NOTE) The Coronavirus on the Respiratory Panel, DOES NOT test for the novel  Coronavirus (2019 nCoV)    Coronavirus HKU1 NOT DETECTED NOT DETECTED Final   Coronavirus NL63 NOT DETECTED NOT DETECTED Final   Coronavirus OC43 NOT DETECTED NOT DETECTED Final   Metapneumovirus NOT DETECTED NOT DETECTED Final   Rhinovirus / Enterovirus NOT DETECTED NOT DETECTED Final   Influenza A NOT DETECTED NOT DETECTED Final   Influenza B NOT DETECTED NOT DETECTED Final   Parainfluenza Virus 1 NOT DETECTED NOT DETECTED Final   Parainfluenza Virus 2 NOT DETECTED NOT DETECTED Final   Parainfluenza Virus 3 NOT DETECTED NOT DETECTED Final   Parainfluenza Virus 4 NOT DETECTED NOT DETECTED Final   Respiratory Syncytial Virus NOT DETECTED NOT DETECTED Final   Bordetella pertussis NOT DETECTED NOT DETECTED Final   Bordetella Parapertussis NOT DETECTED NOT DETECTED Final   Chlamydophila pneumoniae NOT DETECTED NOT DETECTED Final   Mycoplasma pneumoniae NOT DETECTED NOT DETECTED Final    Comment: Performed at [facility], [place]. [address] [place], [place] [address]    Coagulation Studies: No results for input(s): LABPROT, INR in the last 72 hours.   Urinalysis: Recent Labs    08/23/21 0109  COLORURINE YELLOW  LABSPEC 1.020  PHURINE 7.0  GLUCOSEU NEGATIVE  HGBUR SMALL*  BILIRUBINUR NEGATIVE  KETONESUR NEGATIVE   PROTEINUR >300*  NITRITE NEGATIVE  LEUKOCYTESUR NEGATIVE       Imaging: PERIPHERAL VASCULAR CATHETERIZATION  Result Date: 08/23/2021 See surgical note for result.    Medications:    sodium chloride     sodium chloride     cefTRIAXone (ROCEPHIN)  IV Stopped (08/24/21 1406)    carvedilol  25 mg Oral BID WC   Chlorhexidine Gluconate Cloth  6 each Topical Q0600   Chlorhexidine Gluconate Cloth  6 each Topical Q0600   chlorthalidone  50 mg Oral Daily   cloNIDine  0.3 mg Transdermal Weekly   heparin  5,000 Units Subcutaneous Q8H   hydroxychloroquine  400 mg Oral Daily   isosorbide dinitrate  20 mg Oral TID   lacosamide  150 mg Oral BID   methylPREDNISolone (SOLU-MEDROL) injection  80 mg Intravenous Q12H   NIFEdipine  90 mg Oral Daily   pantoprazole  40 mg Oral Daily   pravastatin  20 mg Oral Daily   sodium chloride, sodium chloride, acetaminophen, alteplase, bismuth subsalicylate, docusate sodium, heparin, HYDROmorphone (DILAUDID) injection, labetalol, levalbuterol, lidocaine (PF), lidocaine-prilocaine, LORazepam, ondansetron (ZOFRAN) IV, ondansetron (ZOFRAN) IV, oxyCODONE, pentafluoroprop-tetrafluoroeth, polyethylene glycol  Assessment/ Plan:  20 y.o. 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     1.  Acute kidney injury/chronic kidney disease stage IV/class IV lupus nephritis/proteinuria.  Patient has been actively followed by [facility] nephrology.  In the past has been treated with Cytoxan.  Given her young age it was felt that further Cytoxan treatment should be avoided.  Acute kidney injury now could be secondary to  progressive lupus nephritis. -Patient has tolerated first 2 dialysis sessions quite well.  We are planning for third dialysis treatment today.  We agree with rheumatology in regards to her underlying renal failure.  Question as to element of reversibility at this point in time.  We will reconsider renal biopsy as yield may be low given severity of renal failure presentation.  2.  Hyperkalemia.  Potassium now normalized to 4.5.  Continue periodically monitor.  3.  Anemia of chronic kidney disease/hemolytic anemia in the setting of lupus nephritis.  Hemoglobin 7.8 at last check.  Continue to monitor periodically.  4.  Secondary hyperparathyroidism.  Phosphorus down to 5.1 with ongoing dialysis treatments.   LOS: 4 Lanayah Gartley 11/30/202211:00 AM

## 2021-08-25 NOTE — Progress Notes (Signed)
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  When EMS arrived she was postictal.  When she became unresponsive sounds like she was gasping and choking.  Neurology was consulted on admission, she was loaded with Vimpat.  She also met criteria for sepsis and was hypoxic requiring heated high flow nasal cannula.  She was initially admitted to the ICU, improved and eventually transferred to the hospitalist service on 11/29  ED course: Per EDP patient appeared post ictal on arrival, now oriented & moving all extremities. Due to breakthrough seizure while on Vimpat, EDP discussed the case with Pharmacist and a loading dose was given IV. Patient was switched to Vimpat from [place] due to breakthrough seizures. Patient met criteria for Sepsis workup and protocol initiated with only 1 L of IVF given due to BNP > 4500. Patient was transitioned to [facility] because of hypoxia.  Subjective / 24h Interval events: Patient denies any complaint.  Seen along with nephrology.  Had a lot of questions regarding dialysis and a possible renal transplant.  Patient might consider peritoneal dialysis. Awaiting [facility] transfer-nephrology will talk with [facility] again today.  Assessment & Plan: Principal Problem Acute hypoxic respiratory failure due to multifocal pneumonia, sepsis -initially requiring high flow nasal cannula, currently improved on room air.  She has been placed on IV antibiotics with ceftriaxone and metronidazole, improving,  sepsis physiology resolved.  Active Problems SLE-rheumatology consulted and evaluated patient 11/27.  Currently recommending to boost with IV steroids 8 mg Solu-Medrol twice daily.  She is awaiting transfer to [facility] to be evaluated by her outpatient team.  Continue hydroxychloroquine as well  Seizure disorder-apparently patient missed a dose of Vimpat prior to the seizure.  She was on Keppra in the past but she was changed to Vimpat due to breakthrough seizures.  She is currently on 150 mg every 12, she has been seizure-free.  Neurology signed off on 11/27  Acute kidney injury on chronic kidney disease stage IIIb -nephrology consulted, has progressed to the point that she had to have a dialysis catheter placed on 11/28 and underwent HD same day.   Patient was seen with Dr. Zollie Scale today.  Had a long discussion regarding renal transplant and hemodialysis versus peritoneal dialysis.  Patient is leaning more towards peritoneal dialysis.  Also awaiting transfer to [facility]. 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  Cardiology consulted due to worsening EF 30-35% down from 55% in September 2022 and moderate to large pericardial effusion.  Appreciate input, volume management with dialysis, no evidence of cardiac tamponade and per cardiology no role for NSAIDs.  For now she is to continue carvedilol, to consider resuming spironolactone and Entresto if there is no chance of renal recovery.  Empagliflozin is contraindicated in ESRD  Hyperkalemia, hyperphosphatemia-due to renal failure, managed with HD  Non-anion gap metabolic acidosis-due to renal failure  Anemia of chronic renal disease, history of hemolytic anemia-monitor hemoglobin, currently 7.8  Thrombocytopenia-due to renal failure, monitor  Essential hypertension-continue carvedilol, clonidine,  nifedipine  Hyperlipidemia-continue statin  Scheduled Meds:  carvedilol  25 mg Oral BID WC   Chlorhexidine Gluconate Cloth  6 each Topical Q0600   Chlorhexidine Gluconate Cloth  6 each Topical Q0600   chlorthalidone  50 mg Oral Daily   cloNIDine  0.3 mg Transdermal Weekly   feeding supplement (NEPRO CARB STEADY)  237 mL Oral TID BM   heparin  5,000 Units Subcutaneous Q8H   hydroxychloroquine  400 mg Oral Daily   lacosamide  150 mg Oral BID   methylPREDNISolone (SOLU-MEDROL) injection  80 mg Intravenous Q12H   multivitamin  1 tablet Oral QHS   NIFEdipine  90 mg Oral Daily   pantoprazole  40 mg Oral Daily   pravastatin  20 mg Oral Daily   [START ON 08/26/2021] spironolactone  12.5 mg Oral Daily   Continuous Infusions:  sodium chloride     sodium chloride     cefTRIAXone (ROCEPHIN)  IV Stopped (08/24/21 1406)   PRN Meds:.sodium chloride, sodium chloride, acetaminophen, alteplase, bismuth subsalicylate, docusate sodium, heparin, HYDROmorphone (DILAUDID) injection, labetalol, levalbuterol, lidocaine (PF), lidocaine-prilocaine, LORazepam, ondansetron (ZOFRAN) IV, ondansetron (ZOFRAN) IV, oxyCODONE, pentafluoroprop-tetrafluoroeth, polyethylene glycol  Diet Orders (From admission, onward)     Start     Ordered   08/23/21 1620  Diet renal with fluid restriction Fluid restriction: 1200 mL Fluid; Room service appropriate? Yes; Fluid consistency: Thin  Diet effective now       Question Answer Comment  Fluid restriction: 1200 mL Fluid   Room service appropriate? Yes   Fluid consistency: Thin      08/23/21 1619            DVT prophylaxis: heparin injection 5,000 Units Start: 08/21/21 0600 SCDs Start: 08/21/21 0159    Code Status: Full Code  Family Communication: Discussed with mother at bedside.  Status is: Inpatient  Remains inpatient appropriate because: Severity of illness  Level of care: Med-Surg  Consultants:  Nephrology  Cardiology  PCCM Neurology  Vascular  surgery Rheumatology  Procedures:  R IJ placement by vascular 11/28 08/21/2021: Chest x-ray>>IMPRESSION: 1. Extensive patchy airspace disease throughout the right upper and lower lobes, concerning for multifocal pneumonia. 2. Underlying cardiomegaly with perihilar vascular congestion without overt pulmonary edema. 3. Probable trace bilateral pleural effusions. 08/21/2021: CT head without contrast>>No acute intracranial pathology. 08/21/2021: CT cervical spine>>No acute fracture or subluxation of the cervical spine 08/22/2021: CT abdomen and pelvis without contrast>>IMPRESSION: 1. Pulmonary findings suggestive of infection/inflammation versus edema versus alveolar hemorrhage. 2. Partially visualized at least small volume pericardial effusion. 3. Trace right pleural effusion. 4. Likely gallbladder wall thickening with markedly limited evaluation on this noncontrast study. Recommend right upper quadrant ultrasound for further evaluation.  5. Trace simple free fluid within the abdomen and pelvis 08/23/2021: Right upper quadrant abdominal ultrasound>>IMPRESSION: 1. Thin, echogenic area along the wall of the gallbladder fundus which may represent very mild gallbladder wall calcification. 2. No evidence of acute cholecystitis. Further evaluation with a nuclear medicine hepatobiliary scan is recommended if this is of clinical concern. 3. Small right pleural effusion. 4. Echogenic right kidney which may be secondary to medical renal disease related to the patient's history of chronic renal disease. 08/22/21: Echocardiogram>>  1. Moderate pericardial effusion. The pericardial effusion is posterior  and lateral to the left ventricle. There is no evidence of cardiac  tamponade.   2. Left ventricular ejection fraction, by estimation, is 30 to 35%. The  left ventricle has moderately decreased function. The left ventricle  demonstrates global hypokinesis.  There is mild left ventricular  hypertrophy. Left  ventricular diastolic  parameters are consistent with Grade II diastolic dysfunction  (pseudonormalization). The average left ventricular global longitudinal  strain is -5.1 %. The global longitudinal strain is abnormal.   3. Right ventricular systolic function is normal. The right ventricular  size is normal. There is normal pulmonary artery systolic pressure. The  estimated right ventricular systolic pressure is 00.9 mmHg.   4. Left atrial size was moderately dilated.   5. Right atrial size was mildly dilated.   6. The mitral valve is normal in structure. Mild mitral valve regurgitation. No evidence of mitral stenosis.   7. Tricuspid valve regurgitation is moderate.   8. The inferior vena cava is normal in size with greater than 50% respiratory variability, suggesting right atrial pressure of 3 mmHg.   Microbiology  Blood cultures 11/26-no growth to date Urine culture 11/26-multiple species, suggest recollection SARS COVID 2, flu A, flu B, RVP-negative MRSA PCR-negative  Antimicrobials: Azithromycin 11/26 >> Ceftriaxone 11/26 >>   Objective: Vitals:   08/25/21 1500 08/25/21 1515 08/25/21 1530 08/25/21 1545  BP: 119/85 122/89 124/88 (!) 125/94  Pulse: 94 92 91 89  Resp: (!) 23 (!) 29 (!) 23 (!) 23  Temp:      TempSrc:      SpO2:      Weight:      Height:        Intake/Output Summary (Last 24 hours) at 08/25/2021 1714 Last data filed at 08/25/2021 1000 Gross per 24 hour  Intake 914 ml  Output --  Net 914 ml    Filed Weights   08/24/21 0930 08/24/21 1230 08/25/21 1426  Weight: 66.4 kg 65.9 kg 68.9 kg    Examination:  [place] lady, in no acute distress. Pulmonary.  Few basal crackles, normal respiratory effort. CV.  Regular rate and rhythm, no JVD, rub or murmur. Abdomen.  Soft, nontender, nondistended, BS positive. CNS.  Alert and oriented x3.  No focal neurologic deficit. Extremities.  No edema, no cyanosis, pulses intact and symmetrical. Psychiatry.   Judgment and insight appears normal.   Data Reviewed: I have independently reviewed following labs and imaging studies  CBC: Recent Labs  Lab 08/21/21 0100 08/21/21 0531 08/21/21 0957 08/22/21 0409 08/22/21 1626 08/23/21 0548 08/24/21 0416 08/25/21 0414  WBC 20.1* 13.6*  --  32.8*  --  21.0* 9.3 10.7*  NEUTROABS 18.5*  --   --   --   --   --  8.6* 9.8*  HGB 7.4* 6.3*   < > 5.9* 7.8* 8.1* 7.5* 7.8*  HCT 23.4* 20.1*   < > 18.2* 23.2* 23.4* 21.9* 23.5*  MCV 87.6 87.0  --  86.7  --  83.6 83.9 86.1  PLT 188 128*  --  120*  --  101* 110* 100*   < > = values in this interval not displayed.    Basic Metabolic Panel: Recent Labs  Lab 08/21/21 0531 08/22/21 0409 08/22/21 2008 08/23/21 0548 08/23/21 1110 08/24/21 0416 08/25/21 0414  NA 139 137  --  135  --  134* 134*  133*  K 5.1 6.1* 5.8* 5.7* 5.6* 4.9 4.5  4.5  CL 112* 106  --  106  --  101 100  99  CO2 19* 20*  --  18*  --  21* 26  25  GLUCOSE 157* 133*  --  142*  --  179* 163*  166*  BUN 88* 97*  --  89*  --  89* 76*  76*  CREATININE 7.90* 8.78*  --  9.11*  --  8.17* 6.18*  6.09*  CALCIUM 7.3* 7.7*  --  8.2*  --  7.8* 7.6*  7.5*  MG 1.7 2.9*  --  2.7*  --  2.3  --   PHOS 4.4 5.8*  --  6.7* 7.2* 7.4*  7.4* 5.1*    Liver Function Tests: Recent Labs  Lab 08/21/21 0100 08/23/21 0548 08/24/21 0416 08/25/21 0414  AST 20 22  --  21  ALT 15 17  --  24  ALKPHOS 43 48  --  69  BILITOT 0.6 0.9  --  1.0  PROT 6.2* 6.1*  --  5.5*  ALBUMIN 3.2* 2.7* 2.8* 2.8*  2.7*    Coagulation Profile: Recent Labs  Lab 08/21/21 0101  INR 1.0    HbA1C: No results for input(s): HGBA1C in the last 72 hours. CBG: Recent Labs  Lab 08/24/21 1938 08/25/21 0025 08/25/21 0322 08/25/21 0803 08/25/21 1118  GLUCAP 134* 147* 137* 140* 136*     Recent Results (from the past 240 hour(s))  Resp Panel by RT-PCR (Flu A&B, Covid) Nasopharyngeal Swab     Status: None   Collection Time: 08/21/21  1:00 AM   Specimen:  Nasopharyngeal Swab; Nasopharyngeal(NP) swabs in vial transport medium  Result Value Ref Range Status   SARS Coronavirus 2 by RT PCR NEGATIVE NEGATIVE Final    Comment: (NOTE) SARS-CoV-2 target nucleic acids are NOT DETECTED.  The SARS-CoV-2 RNA is generally detectable in upper respiratory specimens during the acute phase of infection. The lowest concentration of SARS-CoV-2 viral copies this assay can detect is 138 copies/mL. A negative result does not preclude SARS-Cov-2 infection and should not be used as the sole basis for treatment or other patient management decisions. A negative result may occur with  improper specimen collection/handling, submission of specimen other than nasopharyngeal swab, presence of viral mutation(s) within the areas targeted by this assay, and inadequate number of viral copies(<138 copies/mL). A negative result must be combined with clinical observations, patient history, and epidemiological information. The expected result is Negative.  Fact Sheet for Patients:  EntrepreneurPulse.com.au  Fact Sheet for Healthcare Providers:  IncredibleEmployment.be  This test is no t yet approved or cleared by the [place] FDA and  has been authorized for detection and/or diagnosis of SARS-CoV-2 by FDA under an Emergency Use Authorization (EUA). This EUA will remain  in effect (meaning this test can be used) for the duration of the COVID-19 declaration under Section 564(b)(1) of the Act, 21 U.S.C.section 360bbb-3(b)(1), unless the authorization is terminated  or revoked sooner.       Influenza A by PCR NEGATIVE NEGATIVE Final   Influenza B by PCR NEGATIVE NEGATIVE Final    Comment: (NOTE) The Xpert Xpress SARS-CoV-2/FLU/RSV plus assay is intended as an aid in the diagnosis of influenza from Nasopharyngeal swab specimens and should not be used as a sole basis for treatment. Nasal washings and aspirates are unacceptable for  Xpert Xpress SARS-CoV-2/FLU/RSV testing.  Fact Sheet for Patients: EntrepreneurPulse.com.au  Fact Sheet for Healthcare Providers: IncredibleEmployment.be  This test is not yet approved or cleared by the [place] FDA and has been authorized for detection and/or diagnosis of SARS-CoV-2 by FDA under an Emergency Use Authorization (EUA). This EUA will remain in effect (meaning this test can be used) for the duration of the COVID-19 declaration under Section 564(b)(1) of the Act, 21 U.S.C. section 360bbb-3(b)(1), unless the authorization is terminated or  revoked.  Performed at [facility], [address] [place], [place] [address]   Blood Culture (routine x 2)     Status: None (Preliminary result)   Collection Time: 08/21/21  1:04 AM   Specimen: BLOOD  Result Value Ref Range Status   Specimen Description BLOOD RIGHT FOREARM  Final   Special Requests   Final    BOTTLES DRAWN AEROBIC AND ANAEROBIC Blood Culture results may not be optimal due to an inadequate volume of blood received in culture bottles   Culture   Final    NO GROWTH 4 DAYS Performed at [facility], [address] [place], [place] [address]    Report Status PENDING  Incomplete  Blood Culture (routine x 2)     Status: None (Preliminary result)   Collection Time: 08/21/21  1:05 AM   Specimen: BLOOD  Result Value Ref Range Status   Specimen Description BLOOD RIGHT HAND  Final   Special Requests   Final    BOTTLES DRAWN AEROBIC AND ANAEROBIC Blood Culture adequate volume   Culture   Final    NO GROWTH 4 DAYS Performed at [facility], [address], [place], [place] [address]    Report Status PENDING  Incomplete  Urine Culture     Status: Abnormal   Collection Time: 08/21/21  2:05 AM   Specimen: In/Out Cath Urine  Result Value Ref Range Status   Specimen Description   Final    IN/OUT CATH URINE Performed at [facility], [address] [place], [place] [address]    Special Requests   Final    NONE Performed at [facility], [address], [place], [place] [address]    Culture MULTIPLE SPECIES PRESENT, SUGGEST RECOLLECTION (A)  Final   Report Status 08/22/2021 FINAL  Final  MRSA Next Gen by PCR, Nasal     Status: None   Collection Time: 08/21/21  4:50 AM   Specimen: Nasal Mucosa; Nasal Swab  Result Value Ref Range Status   MRSA by PCR Next Gen NOT DETECTED NOT DETECTED Final    Comment: (NOTE) The GeneXpert MRSA Assay (FDA approved for NASAL specimens only), is one component of a comprehensive MRSA colonization surveillance program. It is not intended to diagnose MRSA infection nor to guide or monitor treatment for MRSA infections. Test performance is not FDA approved in patients less than 67 years old. Performed at [facility], [place], [place] [address]   Respiratory (~20 pathogens) panel by PCR     Status: None   Collection Time: 08/21/21  7:06 AM   Specimen: Nasopharyngeal Swab; Respiratory  Result Value Ref Range Status   Adenovirus NOT DETECTED NOT DETECTED Final   Coronavirus 229E NOT DETECTED NOT DETECTED Final    Comment: (NOTE) The Coronavirus on the Respiratory Panel, DOES NOT test for the novel  Coronavirus (2019 nCoV)    Coronavirus HKU1 NOT DETECTED NOT DETECTED Final   Coronavirus NL63 NOT DETECTED NOT DETECTED Final   Coronavirus OC43 NOT DETECTED NOT DETECTED Final   Metapneumovirus NOT DETECTED NOT DETECTED Final   Rhinovirus / Enterovirus NOT DETECTED NOT DETECTED Final   Influenza A NOT DETECTED NOT DETECTED Final   Influenza B NOT DETECTED NOT DETECTED Final   Parainfluenza Virus 1 NOT DETECTED NOT DETECTED Final   Parainfluenza Virus 2 NOT DETECTED NOT DETECTED Final   Parainfluenza Virus 3 NOT DETECTED NOT DETECTED Final   Parainfluenza Virus 4 NOT DETECTED  NOT DETECTED Final   Respiratory Syncytial Virus NOT DETECTED NOT DETECTED  Final   Bordetella pertussis NOT DETECTED NOT DETECTED Final   Bordetella Parapertussis NOT DETECTED NOT DETECTED Final   Chlamydophila pneumoniae NOT DETECTED NOT DETECTED Final   Mycoplasma pneumoniae NOT DETECTED NOT DETECTED Final    Comment: Performed at [facility], [place] [address], [place], [place] [address]      Radiology Studies: No results found.  Lorella Nimrod MD [facility]  Between 7 am - 7 pm I am available, please contact me via Amion (for emergencies) or Securechat (non urgent messages)  Between 7 pm - 7 am I am not available, please contact night coverage MD/APP via Amion

## 2021-08-25 NOTE — Progress Notes (Addendum)
Progress Note  Patient Name: Melinda Steele Date of Encounter: 08/25/2021  [facility] Cardiologist: None   Subjective   Patient reports she is feeling better. BP better with med changes, heart rate up around 100bpm. No chest pain or SOB. Question of whether she will be transferred to [facility] [place].   Inpatient Medications    Scheduled Meds:  carvedilol  25 mg Oral BID WC   Chlorhexidine Gluconate Cloth  6 each Topical Q0600   Chlorhexidine Gluconate Cloth  6 each Topical Q0600   chlorthalidone  50 mg Oral Daily   cloNIDine  0.3 mg Transdermal Weekly   heparin  5,000 Units Subcutaneous Q8H   hydroxychloroquine  400 mg Oral Daily   isosorbide-hydrALAZINE  1 tablet Oral TID   lacosamide  150 mg Oral BID   methylPREDNISolone (SOLU-MEDROL) injection  80 mg Intravenous Q12H   NIFEdipine  90 mg Oral Daily   pantoprazole  40 mg Oral Daily   pravastatin  20 mg Oral Daily   Continuous Infusions:  sodium chloride     sodium chloride     cefTRIAXone (ROCEPHIN)  IV Stopped (08/24/21 1406)   PRN Meds: sodium chloride, sodium chloride, acetaminophen, alteplase, bismuth subsalicylate, docusate sodium, heparin, HYDROmorphone (DILAUDID) injection, labetalol, levalbuterol, lidocaine (PF), lidocaine-prilocaine, LORazepam, ondansetron (ZOFRAN) IV, ondansetron (ZOFRAN) IV, oxyCODONE, pentafluoroprop-tetrafluoroeth, polyethylene glycol   Vital Signs    Vitals:   08/25/21 0700 08/25/21 0716 08/25/21 0730 08/25/21 0745  BP: 119/82 120/87 120/88 120/85  Pulse: 100 (!) 101 99 99  Resp: (!) 24 14 (!) 24 20  Temp:      TempSrc:      SpO2: 91% 95% (!) 89% 94%  Weight:      Height:        Intake/Output Summary (Last 24 hours) at 08/25/2021 0803 Last data filed at 08/24/2021 2157 Gross per 24 hour  Intake 1594 ml  Output 0 ml  Net 1594 ml   Last 3 Weights 08/24/2021 08/24/2021 08/23/2021  Weight (lbs) 145 lb 4.5 oz 146 lb 6.2 oz 155 lb 6.8 oz  Weight (kg) 65.9 kg 66.4 kg 70.5 kg       Telemetry    ST HR 100-110 - Personally Reviewed  ECG    No new - Personally Reviewed  Physical Exam   GEN: No acute distress.   Neck: No JVD Cardiac: tachycardic, RR, no murmurs, rubs, or gallops.  Respiratory: Clear to auscultation bilaterally. GI: Soft, nontender, non-distended  MS: No edema; No deformity. Neuro:  Nonfocal  Psych: Normal affect   Labs    High Sensitivity Troponin:   Recent Labs  Lab 08/21/21 0101 08/21/21 0531 08/21/21 0957  TROPONINIHS 538* 619* 645*     Chemistry Recent Labs  Lab 08/21/21 0100 08/21/21 0531 08/22/21 0409 08/22/21 2008 08/23/21 0548 08/23/21 1110 08/24/21 0416 08/25/21 0414  NA 137   < > 137  --  135  --  134* 134*  133*  K 5.1   < > 6.1*   < > 5.7* 5.6* 4.9 4.5  4.5  CL 110   < > 106  --  106  --  101 100  99  CO2 17*   < > 20*  --  18*  --  21* 26  25  GLUCOSE 80   < > 133*  --  142*  --  179* 163*  166*  BUN 86*   < > 97*  --  89*  --  89* 76*  76*  CREATININE 7.89*   < > 8.78*  --  9.11*  --  8.17* 6.18*  6.09*  CALCIUM 7.7*   < > 7.7*  --  8.2*  --  7.8* 7.6*  7.5*  MG  --    < > 2.9*  --  2.7*  --  2.3  --   PROT 6.2*  --   --   --  6.1*  --   --  5.5*  ALBUMIN 3.2*  --   --   --  2.7*  --  2.8* 2.8*  2.7*  AST 20  --   --   --  22  --   --  21  ALT 15  --   --   --  17  --   --  24  ALKPHOS 43  --   --   --  48  --   --  69  BILITOT 0.6  --   --   --  0.9  --   --  1.0  GFRNONAA 7*   < > 6*  --  6*  --  7* 9*  9*  ANIONGAP 10   < > 11  --  11  --  12 8  9    < > = values in this interval not displayed.    Lipids  Recent Labs  Lab 08/21/21 0531  CHOL 129  TRIG 102  HDL 42  LDLCALC 67  CHOLHDL 3.1    Hematology Recent Labs  Lab 08/23/21 0548 08/24/21 0416 08/25/21 0414  WBC 21.0* 9.3 10.7*  RBC 2.80* 2.61* 2.73*  HGB 8.1* 7.5* 7.8*  HCT 23.4* 21.9* 23.5*  MCV 83.6 83.9 86.1  MCH 28.9 28.7 28.6  MCHC 34.6 34.2 33.2  RDW 14.0 13.9 13.2  PLT 101* 110* 100*   Thyroid No results  for input(s): TSH, FREET4 in the last 168 hours.  BNP Recent Labs  Lab 08/21/21 0101 08/21/21 0531  BNP >4,500.0* >4,500.0*    DDimer No results for input(s): DDIMER in the last 168 hours.   Radiology    PERIPHERAL VASCULAR CATHETERIZATION  Result Date: 08/23/2021 See surgical note for result.   Cardiac Studies   TTE (08/22/2021):  1. Moderate pericardial effusion. The pericardial effusion is posterior  and lateral to the left ventricle. There is no evidence of cardiac  tamponade.   2. Left ventricular ejection fraction, by estimation, is 30 to 35%. The  left ventricle has moderately decreased function. The left ventricle  demonstrates global hypokinesis. There is mild left ventricular  hypertrophy. Left ventricular diastolic  parameters are consistent with Grade II diastolic dysfunction  (pseudonormalization). The average left ventricular global longitudinal  strain is -5.1 %. The global longitudinal strain is abnormal.   3. Right ventricular systolic function is normal. The right ventricular  size is normal. There is normal pulmonary artery systolic pressure. The  estimated right ventricular systolic pressure is 09.3 mmHg.   4. Left atrial size was moderately dilated.   5. Right atrial size was mildly dilated.   6. The mitral valve is normal in structure. Mild mitral valve  regurgitation. No evidence of mitral stenosis.   7. Tricuspid valve regurgitation is moderate.   8. The inferior vena cava is normal in size with greater than 50%  respiratory variability, suggesting right atrial pressure of 3 mmHg.   Patient Profile     20 y.o. female  with a hx of seizures, lupus, chronic systolic heart failure, glomerulonephritis, CKD  3B, hemolytic anemia and chronic pericardial effusions who is being seen 08/22/2021 for the evaluation of pericardial effusion and chronic HFrEF.  Assessment & Plan    Pericardial effusion - pt with h/p pericardiocentesis - effusion is moderate  in size with no evidence of tamponade on echo 11/27, no plan for percutaneous approach - possibly component of SLE - she is on HD for volume management  Chronic HFrEF EF 30-35% - Echo this admission showed EF 30-35%, which is reduced from prior echo at [facility] showing EF 55% 05/2021 and moderate pericardial effusion.  - volume management per HD - Coreg increased 25mg  BID - started on Bidil 1 tab TID>>changed to just Imdur  HTN - coreg increased to 25mg  BID - Imdur added as above - Bps better  Lupus - per IM - rheumotology following - she is on IV steroids  AKI on CKD - suspected 2/2 SLE - she had dialysis catheter place 11/28 and is undergoing dialysis intermittently - nephrology following  Anemia - s/p 2 units PRBCs - Hgb 7.8, seems to be stable - no signs of active bleeding  For questions or updates, please contact [facility] Please consult [email or web address] for contact info under        Signed, Betzabeth Derringer Ninfa Meeker, PA-C  08/25/2021, 8:03 AM

## 2021-08-26 ENCOUNTER — Inpatient Hospital Stay: Admit: 2021-08-26 | Payer: Medicaid Other

## 2021-08-26 DIAGNOSIS — I10 Essential (primary) hypertension: Secondary | ICD-10-CM

## 2021-08-26 DIAGNOSIS — N179 Acute kidney failure, unspecified: Secondary | ICD-10-CM | POA: Diagnosis not present

## 2021-08-26 DIAGNOSIS — J9601 Acute respiratory failure with hypoxia: Secondary | ICD-10-CM | POA: Diagnosis not present

## 2021-08-26 DIAGNOSIS — D589 Hereditary hemolytic anemia, unspecified: Secondary | ICD-10-CM

## 2021-08-26 DIAGNOSIS — I420 Dilated cardiomyopathy: Secondary | ICD-10-CM

## 2021-08-26 DIAGNOSIS — R569 Unspecified convulsions: Secondary | ICD-10-CM

## 2021-08-26 DIAGNOSIS — M329 Systemic lupus erythematosus, unspecified: Secondary | ICD-10-CM

## 2021-08-26 DIAGNOSIS — E872 Acidosis, unspecified: Secondary | ICD-10-CM

## 2021-08-26 DIAGNOSIS — N1832 Chronic kidney disease, stage 3b: Secondary | ICD-10-CM

## 2021-08-26 DIAGNOSIS — I5022 Chronic systolic (congestive) heart failure: Secondary | ICD-10-CM | POA: Diagnosis not present

## 2021-08-26 LAB — CULTURE, BLOOD (ROUTINE X 2)
Culture: NO GROWTH
Culture: NO GROWTH
Special Requests: ADEQUATE

## 2021-08-26 LAB — RENAL FUNCTION PANEL
Albumin: 2.8 g/dL — ABNORMAL LOW (ref 3.5–5.0)
Anion gap: 10 (ref 5–15)
BUN: 43 mg/dL — ABNORMAL HIGH (ref 6–20)
CO2: 25 mmol/L (ref 22–32)
Calcium: 7.8 mg/dL — ABNORMAL LOW (ref 8.9–10.3)
Chloride: 100 mmol/L (ref 98–111)
Creatinine, Ser: 3.71 mg/dL — ABNORMAL HIGH (ref 0.44–1.00)
GFR, Estimated: 17 mL/min — ABNORMAL LOW (ref >60)
Glucose, Bld: 144 mg/dL — ABNORMAL HIGH (ref 70–99)
Phosphorus: 4.7 mg/dL — ABNORMAL HIGH (ref 2.5–4.6)
Potassium: 4.3 mmol/L (ref 3.5–5.1)
Sodium: 135 mmol/L (ref 135–145)

## 2021-08-26 LAB — CBC WITH DIFFERENTIAL/PLATELET
Abs Immature Granulocytes: 0.16 10*3/uL — ABNORMAL HIGH (ref 0.00–0.07)
Basophils Absolute: 0 10*3/uL (ref 0.0–0.1)
Basophils Relative: 0 %
Eosinophils Absolute: 0 10*3/uL (ref 0.0–0.5)
Eosinophils Relative: 0 %
HCT: 24.4 % — ABNORMAL LOW (ref 36.0–46.0)
Hemoglobin: 7.8 g/dL — ABNORMAL LOW (ref 12.0–15.0)
Immature Granulocytes: 2 %
Lymphocytes Relative: 3 %
Lymphs Abs: 0.2 10*3/uL — ABNORMAL LOW (ref 0.7–4.0)
MCH: 27.8 pg (ref 26.0–34.0)
MCHC: 32 g/dL (ref 30.0–36.0)
MCV: 86.8 fL (ref 80.0–100.0)
Monocytes Absolute: 0.5 10*3/uL (ref 0.1–1.0)
Monocytes Relative: 6 %
Neutro Abs: 6.8 10*3/uL (ref 1.7–7.7)
Neutrophils Relative %: 89 %
Platelets: 82 10*3/uL — ABNORMAL LOW (ref 150–400)
RBC: 2.81 MIL/uL — ABNORMAL LOW (ref 3.87–5.11)
RDW: 12.8 % (ref 11.5–15.5)
WBC: 7.6 10*3/uL (ref 4.0–10.5)
nRBC: 0.4 % — ABNORMAL HIGH (ref 0.0–0.2)

## 2021-08-26 LAB — PROTIME-INR
INR: 1 (ref 0.8–1.2)
Prothrombin Time: 13.2 seconds (ref 11.4–15.2)

## 2021-08-26 MED ORDER — METHYLPREDNISOLONE SODIUM SUCC 125 MG IJ SOLR
80.0000 mg | INTRAMUSCULAR | Status: DC
  Administered 2021-08-27: 80 mg via INTRAVENOUS
  Filled 2021-08-26: qty 2

## 2021-08-26 NOTE — Progress Notes (Signed)
[facility]  ROUNDING NOTE   Subjective:  Patient seen and evaluated at bedside.  Her grandparents are currently at the bedside. Underwent third dialysis treatment yesterday. Patient would like to proceed with peritoneal dialysis catheter placement.   Objective:  Vital signs in last 24 hours:  Temp:  [97.7 F (36.5 C)-98.7 F (37.1 C)] 98.7 F (37.1 C) (12/01 0800) Pulse Rate:  [84-105] 95 (12/01 0800) Resp:  [0-29] 11 (12/01 0800) BP: (114-154)/(81-111) 144/108 (12/01 0800) SpO2:  [91 %-100 %] 94 % (12/01 0800) Weight:  [67.8 kg-68.9 kg] 67.8 kg (11/30 1831)  Weight change: 2.5 kg Filed Weights   08/24/21 1230 08/25/21 1426 08/25/21 1831  Weight: 65.9 kg 68.9 kg 67.8 kg    Intake/Output: I/O last 3 completed shifts: In: 674 [P.O.:674] Out: 511 [Other:511]   Intake/Output this shift:  No intake/output data recorded.  Physical Exam: General: No acute distress  Head: Normocephalic, atraumatic. Moist oral mucosal membranes  Eyes: Anicteric  Neck: Supple  Lungs:  Clear to auscultation, normal effort  Heart: S1S2 no obvious rubs  Abdomen:  Soft, nontender, bowel sounds present  Extremities: No peripheral edema.  Neurologic: Awake, alert, conversant.  Skin: No acute rash  Access: Right IJ PermCath    Basic Metabolic Panel: Recent Labs  Lab 08/21/21 0531 08/22/21 0409 08/22/21 2008 08/23/21 0548 08/23/21 1110 08/24/21 0416 08/25/21 0414 08/26/21 0406  NA 139 137  --  135  --  134* 134*  133* 135  K 5.1 6.1*   < > 5.7* 5.6* 4.9 4.5  4.5 4.3  CL 112* 106  --  106  --  101 100  99 100  CO2 19* 20*  --  18*  --  21* 26  25 25   GLUCOSE 157* 133*  --  142*  --  179* 163*  166* 144*  BUN 88* 97*  --  89*  --  89* 76*  76* 43*  CREATININE 7.90* 8.78*  --  9.11*  --  8.17* 6.18*  6.09* 3.71*  CALCIUM 7.3* 7.7*  --  8.2*  --  7.8* 7.6*  7.5* 7.8*  MG 1.7 2.9*  --  2.7*  --  2.3  --   --   PHOS 4.4 5.8*  --  6.7* 7.2* 7.4*  7.4* 5.1* 4.7*   <  > = values in this interval not displayed.     Liver Function Tests: Recent Labs  Lab 08/21/21 0100 08/23/21 0548 08/24/21 0416 08/25/21 0414 08/26/21 0406  AST 20 22  --  21  --   ALT 15 17  --  24  --   ALKPHOS 43 48  --  69  --   BILITOT 0.6 0.9  --  1.0  --   PROT 6.2* 6.1*  --  5.5*  --   ALBUMIN 3.2* 2.7* 2.8* 2.8*  2.7* 2.8*    No results for input(s): LIPASE, AMYLASE in the last 168 hours. No results for input(s): AMMONIA in the last 168 hours.  CBC: Recent Labs  Lab 08/21/21 0100 08/21/21 0531 08/22/21 0409 08/22/21 1626 08/23/21 0548 08/24/21 0416 08/25/21 0414 08/26/21 0406  WBC 20.1*   < > 32.8*  --  21.0* 9.3 10.7* 7.6  NEUTROABS 18.5*  --   --   --   --  8.6* 9.8* 6.8  HGB 7.4*   < > 5.9* 7.8* 8.1* 7.5* 7.8* 7.8*  HCT 23.4*   < > 18.2* 23.2* 23.4* 21.9* 23.5* 24.4*  MCV 87.6   < > 86.7  --  83.6 83.9 86.1 86.8  PLT 188   < > 120*  --  101* 110* 100* 82*   < > = values in this interval not displayed.     Cardiac Enzymes: Recent Labs  Lab 08/21/21 0957  CKTOTAL 75     BNP: Invalid input(s): POCBNP  CBG: Recent Labs  Lab 08/24/21 1938 08/25/21 0025 08/25/21 0322 08/25/21 0803 08/25/21 1118  GLUCAP 134* 147* 137* 140* 136*     Microbiology: Results for orders placed or performed during the hospital encounter of 08/21/21  Resp Panel by RT-PCR (Flu A&B, Covid) Nasopharyngeal Swab     Status: None   Collection Time: 08/21/21  1:00 AM   Specimen: Nasopharyngeal Swab; Nasopharyngeal(NP) swabs in vial transport medium  Result Value Ref Range Status   SARS Coronavirus 2 by RT PCR NEGATIVE NEGATIVE Final    Comment: (NOTE) SARS-CoV-2 target nucleic acids are NOT DETECTED.  The SARS-CoV-2 RNA is generally detectable in upper respiratory specimens during the acute phase of infection. The lowest concentration of SARS-CoV-2 viral copies this assay can detect is 138 copies/mL. A negative result does not preclude SARS-Cov-2 infection and  should not be used as the sole basis for treatment or other patient management decisions. A negative result may occur with  improper specimen collection/handling, submission of specimen other than nasopharyngeal swab, presence of viral mutation(s) within the areas targeted by this assay, and inadequate number of viral copies(<138 copies/mL). A negative result must be combined with clinical observations, patient history, and epidemiological information. The expected result is Negative.  Fact Sheet for Patients:  EntrepreneurPulse.com.au  Fact Sheet for Healthcare Providers:  IncredibleEmployment.be  This test is no t yet approved or cleared by the [place] FDA and  has been authorized for detection and/or diagnosis of SARS-CoV-2 by FDA under an Emergency Use Authorization (EUA). This EUA will remain  in effect (meaning this test can be used) for the duration of the COVID-19 declaration under Section 564(b)(1) of the Act, 21 U.S.C.section 360bbb-3(b)(1), unless the authorization is terminated  or revoked sooner.       Influenza A by PCR NEGATIVE NEGATIVE Final   Influenza B by PCR NEGATIVE NEGATIVE Final    Comment: (NOTE) The Xpert Xpress SARS-CoV-2/FLU/RSV plus assay is intended as an aid in the diagnosis of influenza from Nasopharyngeal swab specimens and should not be used as a sole basis for treatment. Nasal washings and aspirates are unacceptable for Xpert Xpress SARS-CoV-2/FLU/RSV testing.  Fact Sheet for Patients: EntrepreneurPulse.com.au  Fact Sheet for Healthcare Providers: IncredibleEmployment.be  This test is not yet approved or cleared by the [place] FDA and has been authorized for detection and/or diagnosis of SARS-CoV-2 by FDA under an Emergency Use Authorization (EUA). This EUA will remain in effect (meaning this test can be used) for the duration of the COVID-19 declaration  under Section 564(b)(1) of the Act, 21 U.S.C. section 360bbb-3(b)(1), unless the authorization is terminated or revoked.  Performed at [facility], [address] [place], [place] [address]   Blood Culture (routine x 2)     Status: None   Collection Time: 08/21/21  1:04 AM   Specimen: BLOOD  Result Value Ref Range Status   Specimen Description BLOOD RIGHT FOREARM  Final   Special Requests   Final    BOTTLES DRAWN AEROBIC AND ANAEROBIC Blood Culture results may not be optimal due to an inadequate volume of blood received in culture  bottles   Culture   Final    NO GROWTH 5 DAYS Performed at [facility], [address] [place], [place] [address]    Report Status 08/26/2021 FINAL  Final  Blood Culture (routine x 2)     Status: None   Collection Time: 08/21/21  1:05 AM   Specimen: BLOOD  Result Value Ref Range Status   Specimen Description BLOOD RIGHT HAND  Final   Special Requests   Final    BOTTLES DRAWN AEROBIC AND ANAEROBIC Blood Culture adequate volume   Culture   Final    NO GROWTH 5 DAYS Performed at [facility], [address] [place], [place] [address]    Report Status 08/26/2021 FINAL  Final  Urine Culture     Status: Abnormal   Collection Time: 08/21/21  2:05 AM   Specimen: In/Out Cath Urine  Result Value Ref Range Status   Specimen Description   Final    IN/OUT CATH URINE Performed at [facility], [address] [place], [place] [address]    Special Requests   Final    NONE Performed at [facility], [address] [place], [place] [address]    Culture MULTIPLE SPECIES PRESENT, SUGGEST RECOLLECTION (A)  Final   Report Status 08/22/2021 FINAL  Final  MRSA Next Gen by PCR, Nasal     Status: None   Collection Time: 08/21/21  4:50 AM   Specimen: Nasal Mucosa; Nasal Swab  Result Value Ref Range Status   MRSA by PCR Next Gen NOT DETECTED NOT DETECTED Final    Comment: (NOTE) The GeneXpert MRSA Assay (FDA  approved for NASAL specimens only), is one component of a comprehensive MRSA colonization surveillance program. It is not intended to diagnose MRSA infection nor to guide or monitor treatment for MRSA infections. Test performance is not FDA approved in patients less than 5 years old. Performed at [facility], [address] [place], [place] [address]   Respiratory (~20 pathogens) panel by PCR     Status: None   Collection Time: 08/21/21  7:06 AM   Specimen: Nasopharyngeal Swab; Respiratory  Result Value Ref Range Status   Adenovirus NOT DETECTED NOT DETECTED Final   Coronavirus 229E NOT DETECTED NOT DETECTED Final    Comment: (NOTE) The Coronavirus on the Respiratory Panel, DOES NOT test for the novel  Coronavirus (2019 nCoV)    Coronavirus HKU1 NOT DETECTED NOT DETECTED Final   Coronavirus NL63 NOT DETECTED NOT DETECTED Final   Coronavirus OC43 NOT DETECTED NOT DETECTED Final   Metapneumovirus NOT DETECTED NOT DETECTED Final   Rhinovirus / Enterovirus NOT DETECTED NOT DETECTED Final   Influenza A NOT DETECTED NOT DETECTED Final   Influenza B NOT DETECTED NOT DETECTED Final   Parainfluenza Virus 1 NOT DETECTED NOT DETECTED Final   Parainfluenza Virus 2 NOT DETECTED NOT DETECTED Final   Parainfluenza Virus 3 NOT DETECTED NOT DETECTED Final   Parainfluenza Virus 4 NOT DETECTED NOT DETECTED Final   Respiratory Syncytial Virus NOT DETECTED NOT DETECTED Final   Bordetella pertussis NOT DETECTED NOT DETECTED Final   Bordetella Parapertussis NOT DETECTED NOT DETECTED Final   Chlamydophila pneumoniae NOT DETECTED NOT DETECTED Final   Mycoplasma pneumoniae NOT DETECTED NOT DETECTED Final    Comment: Performed at [facility], [place]. [address] [place],  [address]    Coagulation Studies: No results for input(s): LABPROT, INR in the last 72 hours.   Urinalysis: No results for  input(s): COLORURINE, LABSPEC, PHURINE, GLUCOSEU, HGBUR, BILIRUBINUR, KETONESUR,  PROTEINUR, UROBILINOGEN, NITRITE, LEUKOCYTESUR in the last 72 hours.  Invalid input(s): APPERANCEUR     Imaging: No results found.   Medications:    sodium chloride     sodium chloride     cefTRIAXone (ROCEPHIN)  IV Stopped (08/25/21 1930)    carvedilol  25 mg Oral BID WC   Chlorhexidine Gluconate Cloth  6 each Topical Q0600   Chlorhexidine Gluconate Cloth  6 each Topical Q0600   chlorthalidone  50 mg Oral Daily   cloNIDine  0.3 mg Transdermal Weekly   feeding supplement (NEPRO CARB STEADY)  237 mL Oral TID BM   heparin  5,000 Units Subcutaneous Q8H   hydroxychloroquine  400 mg Oral Daily   lacosamide  150 mg Oral BID   methylPREDNISolone (SOLU-MEDROL) injection  80 mg Intravenous Q12H   multivitamin  1 tablet Oral QHS   NIFEdipine  90 mg Oral Daily   pantoprazole  40 mg Oral Daily   pravastatin  20 mg Oral Daily   spironolactone  12.5 mg Oral Daily   sodium chloride, sodium chloride, acetaminophen, alteplase, bismuth subsalicylate, docusate sodium, heparin, HYDROmorphone (DILAUDID) injection, labetalol, levalbuterol, lidocaine (PF), lidocaine-prilocaine, LORazepam, ondansetron (ZOFRAN) IV, ondansetron (ZOFRAN) IV, oxyCODONE, pentafluoroprop-tetrafluoroeth, polyethylene glycol  Assessment/ Plan:  20 y.o. 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     1.  Acute kidney injury/chronic kidney disease stage IV/class IV lupus nephritis/proteinuria.  Patient has been actively followed by [facility] nephrology.  In the past has been treated with Cytoxan.  Given her young age it was felt that further Cytoxan treatment should be  avoided.  Acute kidney injury now could be secondary to progressive lupus nephritis. -Patient has completed 3 dialysis sessions.  We will hold off on additional dialysis treatment today.  We had a long discussion about peritoneal dialysis yesterday.  Patient would like to move forward with PD catheter placement.  We will consult with Dr. Dahlia Byes for this purpose.  Suspect that the patient most likely has ESRD.  Hold off on repeat renal biopsy at this moment in time.  2.  Hyperkalemia.  Potassium remains within the normal range at 4.3.  3.  Anemia of chronic kidney disease/hemolytic anemia in the setting of lupus nephritis.  Hemoglobin 7.8 at last check.  Continue to monitor periodically.  Hemoglobin low but stable at 7.8.  We will continue to monitor periodically.  4.  Secondary hyperparathyroidism.  Phosphorus down to 4.7 with ongoing dialysis treatments.   LOS: 5 Melinda Steele 12/1/20229:08 AM

## 2021-08-26 NOTE — Progress Notes (Signed)
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  When EMS arrived she was postictal.  When she became unresponsive sounds like she was gasping and choking.  Neurology was consulted on admission, she was loaded with Vimpat.  She also met criteria for sepsis and was hypoxic requiring heated high flow nasal cannula.  She was initially admitted to the ICU, improved and eventually transferred to the hospitalist service on 11/29  ED course: Per EDP patient appeared post ictal on arrival, now oriented & moving all extremities. Due to breakthrough seizure while on Vimpat, EDP discussed the case with Pharmacist and a loading dose was given IV. Patient was switched to Vimpat from [place] due to breakthrough seizures. Patient met criteria for Sepsis workup and protocol initiated with only 1 L of IVF given due to BNP > 4500. Patient was transitioned to [facility] because of hypoxia, now on room air.  Patient started on hemodialysis due to worsening renal function.  Most likely secondary to lupus. Going for PD catheter placement tomorrow with general surgery. Patient needs to go to [facility] for further work-up and renal transplant, apparently on their list for transfer which might not have been so easily.  Most likely will be discharged home and she will follow-up with her [facility] providers as an outpatient so they can facilitate a potential transplant.  Subjective / 24h Interval events: Patient was seen and examined today.  No new  complaints.  Waiting for her PD catheter to be placed tomorrow.  Anxious to go home.  Assessment & Plan: Principal Problem Acute hypoxic respiratory failure due to multifocal pneumonia, sepsis -initially requiring high flow nasal cannula, currently improved on room air.  She has been placed on IV antibiotics with ceftriaxone and metronidazole, improving, sepsis physiology resolved.  Active Problems SLE-rheumatology consulted and evaluated patient 11/27. She is awaiting transfer to [facility] to be evaluated by her outpatient team.  Continue hydroxychloroquine as well -Rheumatology decrease the dose of steroid to daily dose, plan to taper to her home dose in next 2 to 3 weeks.  Seizure disorder-apparently patient missed a dose of Vimpat prior to the seizure.  She was on Keppra in the past but she was changed to Vimpat due to breakthrough seizures.  She is currently on 150 mg every 12, she has been seizure-free.  Neurology signed off on 11/27  Acute kidney injury on chronic kidney disease stage IIIb -nephrology consulted, has progressed to the point that she had to have a dialysis catheter placed on 11/28 and underwent HD same day.   Patient was seen with Dr. Zollie Scale today.  Had a long discussion regarding renal transplant and hemodialysis versus peritoneal dialysis.  Patient is leaning more towards peritoneal dialysis.  Also awaiting transfer to [facility].  Received 3 days of HD.  Patient would like to have peritoneal dialysis. -Going for PD catheter placement with general surgery tomorrow.  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  Cardiology consulted due to worsening EF 30-35% down from 55% in September 2022 and moderate  to large pericardial effusion.  Appreciate input, volume management with dialysis, no evidence of cardiac tamponade and per cardiology no role for NSAIDs.  For now she is to continue carvedilol, to  consider resuming spironolactone and Entresto if there is no chance of renal recovery.  Empagliflozin is contraindicated in ESRD -Cardiology started her on BiDil  Hyperkalemia, hyperphosphatemia-due to renal failure, managed with HD  Non-anion gap metabolic acidosis-due to renal failure  Anemia of chronic renal disease, history of hemolytic anemia-monitor hemoglobin, currently 7.8  Thrombocytopenia-due to renal failure, monitor  Essential hypertension-continue carvedilol, clonidine, nifedipine  Hyperlipidemia-continue statin  Scheduled Meds:  carvedilol  25 mg Oral BID WC   Chlorhexidine Gluconate Cloth  6 each Topical Q0600   Chlorhexidine Gluconate Cloth  6 each Topical Q0600   chlorthalidone  50 mg Oral Daily   cloNIDine  0.3 mg Transdermal Weekly   feeding supplement (NEPRO CARB STEADY)  237 mL Oral TID BM   hydroxychloroquine  400 mg Oral Daily   lacosamide  150 mg Oral BID   [START ON 08/27/2021] methylPREDNISolone (SOLU-MEDROL) injection  80 mg Intravenous Q24H   multivitamin  1 tablet Oral QHS   NIFEdipine  90 mg Oral Daily   pantoprazole  40 mg Oral Daily   pravastatin  20 mg Oral Daily   spironolactone  12.5 mg Oral Daily   Continuous Infusions:  sodium chloride     sodium chloride     PRN Meds:.sodium chloride, sodium chloride, acetaminophen, alteplase, bismuth subsalicylate, docusate sodium, heparin, HYDROmorphone (DILAUDID) injection, labetalol, levalbuterol, lidocaine (PF), lidocaine-prilocaine, LORazepam, ondansetron (ZOFRAN) IV, ondansetron (ZOFRAN) IV, oxyCODONE, pentafluoroprop-tetrafluoroeth, polyethylene glycol  Diet Orders (From admission, onward)     Start     Ordered   08/27/21 0001  Diet NPO time specified  Diet effective midnight        08/26/21 1014   08/26/21 0953  Diet renal with fluid restriction Fluid restriction: 1200 mL Fluid; Room service appropriate? Yes; Fluid consistency: Thin  Diet effective now       Question Answer Comment  Fluid  restriction: 1200 mL Fluid   Room service appropriate? Yes   Fluid consistency: Thin      08/26/21 0954            DVT prophylaxis: SCDs Start: 08/21/21 0159    Code Status: Full Code  Family Communication: Discussed with mother at bedside.  Status is: Inpatient  Remains inpatient appropriate because: Severity of illness  Level of care: Med-Surg  Consultants:  Nephrology  Cardiology  PCCM Neurology  Vascular surgery Rheumatology  Procedures:  R IJ placement by vascular 11/28 08/21/2021: Chest x-ray>>IMPRESSION: 1. Extensive patchy airspace disease throughout the right upper and lower lobes, concerning for multifocal pneumonia. 2. Underlying cardiomegaly with perihilar vascular congestion without overt pulmonary edema. 3. Probable trace bilateral pleural effusions. 08/21/2021: CT head without contrast>>No acute intracranial pathology. 08/21/2021: CT cervical spine>>No acute fracture or subluxation of the cervical spine 08/22/2021: CT abdomen and pelvis without contrast>>IMPRESSION: 1. Pulmonary findings suggestive of infection/inflammation versus edema versus alveolar hemorrhage. 2. Partially visualized at least small volume pericardial effusion. 3. Trace right pleural effusion. 4. Likely gallbladder wall thickening with markedly limited evaluation on this noncontrast study. Recommend right upper quadrant ultrasound for further evaluation.  5. Trace simple free fluid within the abdomen and pelvis 08/23/2021: Right upper quadrant abdominal ultrasound>>IMPRESSION: 1. Thin, echogenic area along the wall of the gallbladder fundus which may represent very mild gallbladder wall calcification. 2. No evidence of acute cholecystitis. Further  evaluation with a nuclear medicine hepatobiliary scan is recommended if this is of clinical concern. 3. Small right pleural effusion. 4. Echogenic right kidney which may be secondary to medical renal disease related to the patient's  history of chronic renal disease. 08/22/21: Echocardiogram>>  1. Moderate pericardial effusion. The pericardial effusion is posterior  and lateral to the left ventricle. There is no evidence of cardiac  tamponade.   2. Left ventricular ejection fraction, by estimation, is 30 to 35%. The  left ventricle has moderately decreased function. The left ventricle  demonstrates global hypokinesis. There is mild left ventricular  hypertrophy. Left ventricular diastolic  parameters are consistent with Grade II diastolic dysfunction  (pseudonormalization). The average left ventricular global longitudinal  strain is -5.1 %. The global longitudinal strain is abnormal.   3. Right ventricular systolic function is normal. The right ventricular  size is normal. There is normal pulmonary artery systolic pressure. The  estimated right ventricular systolic pressure is 44.8 mmHg.   4. Left atrial size was moderately dilated.   5. Right atrial size was mildly dilated.   6. The mitral valve is normal in structure. Mild mitral valve regurgitation. No evidence of mitral stenosis.   7. Tricuspid valve regurgitation is moderate.   8. The inferior vena cava is normal in size with greater than 50% respiratory variability, suggesting right atrial pressure of 3 mmHg.   Microbiology  Blood cultures 11/26-no growth to date Urine culture 11/26-multiple species, suggest recollection SARS COVID 2, flu A, flu B, RVP-negative MRSA PCR-negative  Antimicrobials: Azithromycin 11/26 >>12/1 Ceftriaxone 11/26 >>12/1   Objective: Vitals:   08/26/21 0400 08/26/21 0700 08/26/21 0800 08/26/21 1300  BP: (!) 148/111 (!) 138/100 (!) 144/108 (!) 137/98  Pulse: 90 89 95 95  Resp: (!) 21 (!) 6 11 18   Temp:   98.7 F (37.1 C)   TempSrc:   Oral   SpO2: 97% 96% 94% 96%  Weight:      Height:        Intake/Output Summary (Last 24 hours) at 08/26/2021 1604 Last data filed at 08/26/2021 1300 Gross per 24 hour  Intake 453.71 ml  Output 511  ml  Net -57.29 ml    Filed Weights   08/24/21 1230 08/25/21 1426 08/25/21 1831  Weight: 65.9 kg 68.9 kg 67.8 kg    Examination:  General.  Well-developed, young lady, in no acute distress. Pulmonary.  Lungs clear bilaterally, normal respiratory effort. CV.  Regular rate and rhythm, no JVD, rub or murmur. Abdomen.  Soft, nontender, nondistended, BS positive. CNS.  Alert and oriented x3.  No focal neurologic deficit. Extremities.  No edema, no cyanosis, pulses intact and symmetrical. Psychiatry.  Judgment and insight appears normal.   Data Reviewed: I have independently reviewed following labs and imaging studies  CBC: Recent Labs  Lab 08/21/21 0100 08/21/21 0531 08/22/21 0409 08/22/21 1626 08/23/21 0548 08/24/21 0416 08/25/21 0414 08/26/21 0406  WBC 20.1*   < > 32.8*  --  21.0* 9.3 10.7* 7.6  NEUTROABS 18.5*  --   --   --   --  8.6* 9.8* 6.8  HGB 7.4*   < > 5.9* 7.8* 8.1* 7.5* 7.8* 7.8*  HCT 23.4*   < > 18.2* 23.2* 23.4* 21.9* 23.5* 24.4*  MCV 87.6   < > 86.7  --  83.6 83.9 86.1 86.8  PLT 188   < > 120*  --  101* 110* 100* 82*   < > = values in this interval not displayed.  Basic Metabolic Panel: Recent Labs  Lab 08/21/21 0531 08/22/21 0409 08/22/21 2008 08/23/21 0548 08/23/21 1110 08/24/21 0416 08/25/21 0414 08/26/21 0406  NA 139 137  --  135  --  134* 134*  133* 135  K 5.1 6.1*   < > 5.7* 5.6* 4.9 4.5  4.5 4.3  CL 112* 106  --  106  --  101 100  99 100  CO2 19* 20*  --  18*  --  21* 26  25 25   GLUCOSE 157* 133*  --  142*  --  179* 163*  166* 144*  BUN 88* 97*  --  89*  --  89* 76*  76* 43*  CREATININE 7.90* 8.78*  --  9.11*  --  8.17* 6.18*  6.09* 3.71*  CALCIUM 7.3* 7.7*  --  8.2*  --  7.8* 7.6*  7.5* 7.8*  MG 1.7 2.9*  --  2.7*  --  2.3  --   --   PHOS 4.4 5.8*  --  6.7* 7.2* 7.4*  7.4* 5.1* 4.7*   < > = values in this interval not displayed.    Liver Function Tests: Recent Labs  Lab 08/21/21 0100 08/23/21 0548 08/24/21 0416  08/25/21 0414 08/26/21 0406  AST 20 22  --  21  --   ALT 15 17  --  24  --   ALKPHOS 43 48  --  69  --   BILITOT 0.6 0.9  --  1.0  --   PROT 6.2* 6.1*  --  5.5*  --   ALBUMIN 3.2* 2.7* 2.8* 2.8*  2.7* 2.8*    Coagulation Profile: Recent Labs  Lab 08/21/21 0101 08/26/21 1029  INR 1.0 1.0    HbA1C: No results for input(s): HGBA1C in the last 72 hours. CBG: Recent Labs  Lab 08/24/21 1938 08/25/21 0025 08/25/21 0322 08/25/21 0803 08/25/21 1118  GLUCAP 134* 147* 137* 140* 136*     Recent Results (from the past 240 hour(s))  Resp Panel by RT-PCR (Flu A&B, Covid) Nasopharyngeal Swab     Status: None   Collection Time: 08/21/21  1:00 AM   Specimen: Nasopharyngeal Swab; Nasopharyngeal(NP) swabs in vial transport medium  Result Value Ref Range Status   SARS Coronavirus 2 by RT PCR NEGATIVE NEGATIVE Final    Comment: (NOTE) SARS-CoV-2 target nucleic acids are NOT DETECTED.  The SARS-CoV-2 RNA is generally detectable in upper respiratory specimens during the acute phase of infection. The lowest concentration of SARS-CoV-2 viral copies this assay can detect is 138 copies/mL. A negative result does not preclude SARS-Cov-2 infection and should not be used as the sole basis for treatment or other patient management decisions. A negative result may occur with  improper specimen collection/handling, submission of specimen other than nasopharyngeal swab, presence of viral mutation(s) within the areas targeted by this assay, and inadequate number of viral copies(<138 copies/mL). A negative result must be combined with clinical observations, patient history, and epidemiological information. The expected result is Negative.  Fact Sheet for Patients:  EntrepreneurPulse.com.au  Fact Sheet for Healthcare Providers:  IncredibleEmployment.be  This test is no t yet approved or cleared by the [place] FDA and  has been authorized for detection  and/or diagnosis of SARS-CoV-2 by FDA under an Emergency Use Authorization (EUA). This EUA will remain  in effect (meaning this test can be used) for the duration of the COVID-19 declaration under Section 564(b)(1) of the Act, 21 U.S.C.section 360bbb-3(b)(1), unless the authorization is terminated  or revoked sooner.       Influenza A by PCR NEGATIVE NEGATIVE Final   Influenza B by PCR NEGATIVE NEGATIVE Final    Comment: (NOTE) The Xpert Xpress SARS-CoV-2/FLU/RSV plus assay is intended as an aid in the diagnosis of influenza from Nasopharyngeal swab specimens and should not be used as a sole basis for treatment. Nasal washings and aspirates are unacceptable for Xpert Xpress SARS-CoV-2/FLU/RSV testing.  Fact Sheet for Patients: EntrepreneurPulse.com.au  Fact Sheet for Healthcare Providers: IncredibleEmployment.be  This test is not yet approved or cleared by the [place] FDA and has been authorized for detection and/or diagnosis of SARS-CoV-2 by FDA under an Emergency Use Authorization (EUA). This EUA will remain in effect (meaning this test can be used) for the duration of the COVID-19 declaration under Section 564(b)(1) of the Act, 21 U.S.C. section 360bbb-3(b)(1), unless the authorization is terminated or revoked.  Performed at [facility], [address] [place], [place] [address]   Blood Culture (routine x 2)     Status: None   Collection Time: 08/21/21  1:04 AM   Specimen: BLOOD  Result Value Ref Range Status   Specimen Description BLOOD RIGHT FOREARM  Final   Special Requests   Final    BOTTLES DRAWN AEROBIC AND ANAEROBIC Blood Culture results may not be optimal due to an inadequate volume of blood received in culture bottles   Culture   Final    NO GROWTH 5 DAYS Performed at [facility], [address], [place], [place] [address]    Report Status 08/26/2021 FINAL  Final  Blood Culture (routine x 2)      Status: None   Collection Time: 08/21/21  1:05 AM   Specimen: BLOOD  Result Value Ref Range Status   Specimen Description BLOOD RIGHT HAND  Final   Special Requests   Final    BOTTLES DRAWN AEROBIC AND ANAEROBIC Blood Culture adequate volume   Culture   Final    NO GROWTH 5 DAYS Performed at [facility], [address], [place], [place] [address]    Report Status 08/26/2021 FINAL  Final  Urine Culture     Status: Abnormal   Collection Time: 08/21/21  2:05 AM   Specimen: In/Out Cath Urine  Result Value Ref Range Status   Specimen Description   Final    IN/OUT CATH URINE Performed at [facility], [address] [place], [place] [address]    Special Requests   Final    NONE Performed at [facility], [address] [place], [place] [address]    Culture MULTIPLE SPECIES PRESENT, SUGGEST RECOLLECTION (A)  Final   Report Status 08/22/2021 FINAL  Final  MRSA Next Gen by PCR, Nasal     Status: None   Collection Time: 08/21/21  4:50 AM   Specimen: Nasal Mucosa; Nasal Swab  Result Value Ref Range Status   MRSA by PCR Next Gen NOT DETECTED NOT DETECTED Final    Comment: (NOTE) The GeneXpert MRSA Assay (FDA approved for NASAL specimens only), is one component of a comprehensive MRSA colonization surveillance program. It is not intended to diagnose MRSA infection nor to guide or monitor treatment for MRSA infections. Test performance is not FDA approved in patients less than 49 years old. Performed at [facility], [address] [place], [place] [address]   Respiratory (~20 pathogens) panel by PCR     Status: None   Collection Time: 08/21/21  7:06 AM  Specimen: Nasopharyngeal Swab; Respiratory  Result Value Ref Range Status   Adenovirus NOT DETECTED NOT DETECTED Final   Coronavirus 229E NOT DETECTED NOT DETECTED Final    Comment: (NOTE) The Coronavirus on the Respiratory Panel, DOES NOT test for the novel  Coronavirus (2019 nCoV)     Coronavirus HKU1 NOT DETECTED NOT DETECTED Final   Coronavirus NL63 NOT DETECTED NOT DETECTED Final   Coronavirus OC43 NOT DETECTED NOT DETECTED Final   Metapneumovirus NOT DETECTED NOT DETECTED Final   Rhinovirus / Enterovirus NOT DETECTED NOT DETECTED Final   Influenza A NOT DETECTED NOT DETECTED Final   Influenza B NOT DETECTED NOT DETECTED Final   Parainfluenza Virus 1 NOT DETECTED NOT DETECTED Final   Parainfluenza Virus 2 NOT DETECTED NOT DETECTED Final   Parainfluenza Virus 3 NOT DETECTED NOT DETECTED Final   Parainfluenza Virus 4 NOT DETECTED NOT DETECTED Final   Respiratory Syncytial Virus NOT DETECTED NOT DETECTED Final   Bordetella pertussis NOT DETECTED NOT DETECTED Final   Bordetella Parapertussis NOT DETECTED NOT DETECTED Final   Chlamydophila pneumoniae NOT DETECTED NOT DETECTED Final   Mycoplasma pneumoniae NOT DETECTED NOT DETECTED Final    Comment: Performed at [facility], [place]. [address], [place],  [address]      Radiology Studies: No results found.  Lorella Nimrod MD [facility]  Between 7 am - 7 pm I am available, please contact me via Amion (for emergencies) or Securechat (non urgent messages)  Between 7 pm - 7 am I am not available, please contact night coverage MD/APP via Amion

## 2021-08-26 NOTE — Consult Note (Signed)
Rheumatology follow-up Anti-DNA only 11.  Normal complements. Will lower Solu-Medrol to 80 mg every 24 hours. May gradually taper down steroids to her maintenance outpatient dose over the next 2 to 3 weeks

## 2021-08-26 NOTE — Consult Note (Signed)
Patient ID: Melinda Steele, female   DOB: 03/26/2001, 20 y.o.   MRN: [id number]  HPI Melinda Steele is a 20 y.o. female seen in consultation request Dr.Lateef for PD catheter placement. She developed progressive renal failure and was admitted 5 days ago for seizures and respiratory failure. He has been admitted using an appropriate therapy has been instituted.  She has recovered well and has pending xfer orders. She does have a past medical history significant for systemic lupus erythematous complicated by nephritis, pericardial effusion/cardiomyopathy and hemolytic anemia, hypertension, and focal seizures with secondary generalization. She has required hemodialysis during this hospitalization, pericardial feels Gallbladder no definitive evidence of abdominal issues.  she currently denies any abd pain and feels ok, No fevers or chills.  Chemistry: Na+:137, K+: 5.1, BUN/Cr.:43/3.7, Hb 7.8, PL 82 k, INR 1  HPI  Past Medical History:  Diagnosis Date   Hypertension    Lupus ([place])    Lupus nephritis ([place])    Seizure ([place])     Past Surgical History:  Procedure Laterality Date   DIALYSIS/PERMA CATHETER INSERTION N/A 08/23/2021   Procedure: DIALYSIS/PERMA CATHETER INSERTION;  Surgeon: Algernon Huxley, MD;  Location: [facility];  Service: Cardiovascular;  Laterality: N/A;    Family History  Problem Relation Age of Onset   Hyperlipidemia Maternal Grandmother        a. many medical problems, unknown per prior note    Social History Social History   Tobacco Use   Smoking status: Never   Smokeless tobacco: Never  Substance Use Topics   Alcohol use: Never   Drug use: Never    Allergies  Allergen Reactions   Amlodipine Swelling    Current Facility-Administered Medications  Medication Dose Route Frequency Provider Last Rate Last Admin   0.9 %  sodium chloride infusion  100 mL Intravenous PRN Dew, Erskine Squibb, MD       0.9 %  sodium chloride infusion  100 mL Intravenous PRN Lucky Cowboy, Erskine Squibb, MD       acetaminophen (TYLENOL) tablet 650 mg  650 mg Oral Q6H PRN Algernon Huxley, MD   650 mg at 08/22/21 2223   alteplase (CATHFLO ACTIVASE) injection 2 mg  2 mg Intracatheter Once PRN Algernon Huxley, MD       bismuth subsalicylate (PEPTO BISMOL) chewable tablet 524 mg  524 mg Oral Q4H PRN Algernon Huxley, MD   524 mg at 08/22/21 2000   carvedilol (COREG) tablet 25 mg  25 mg Oral BID WC End, Christopher, MD   25 mg at 08/26/21 0847   cefTRIAXone (ROCEPHIN) 2 g in sodium chloride 0.9 % 100 mL IVPB  2 g Intravenous Q24H Algernon Huxley, MD   Stopped at 08/25/21 1930   Chlorhexidine Gluconate Cloth 2 % PADS 6 each  6 each Topical Q0600 Algernon Huxley, MD   6 each at 08/21/21 0414   Chlorhexidine Gluconate Cloth 2 % PADS 6 each  6 each Topical Q0600 Algernon Huxley, MD   6 each at 08/25/21 0534   chlorthalidone (HYGROTON) tablet 50 mg  50 mg Oral Daily Rust-Chester, Britton L, NP   50 mg at 08/26/21 1036   cloNIDine (CATAPRES - Dosed in mg/24 hr) patch 0.3 mg  0.3 mg Transdermal Weekly Algernon Huxley, MD   0.3 mg at 08/22/21 2327   docusate sodium (COLACE) capsule 100 mg  100 mg Oral BID PRN Algernon Huxley, MD   100 mg at 08/22/21 1002  feeding supplement (NEPRO CARB STEADY) liquid 237 mL  237 mL Oral TID BM Lorella Nimrod, MD   237 mL at 08/26/21 1036   heparin injection 1,000 Units  1,000 Units Dialysis PRN Algernon Huxley, MD       HYDROmorphone (DILAUDID) injection 1 mg  1 mg Intravenous Once PRN Algernon Huxley, MD       hydroxychloroquine (PLAQUENIL) tablet 400 mg  400 mg Oral Daily Algernon Huxley, MD   400 mg at 08/26/21 1035   labetalol (NORMODYNE) injection 10 mg  10 mg Intravenous Q2H PRN Algernon Huxley, MD   10 mg at 08/24/21 1522   lacosamide (VIMPAT) tablet 150 mg  150 mg Oral BID Tawnya Crook, RPH   150 mg at 08/26/21 1034   levalbuterol (XOPENEX) nebulizer solution 1.25 mg  1.25 mg Nebulization Q6H PRN Algernon Huxley, MD       lidocaine (PF) (XYLOCAINE) 1 % injection 5 mL  5 mL Intradermal PRN Dew, Erskine Squibb,  MD       lidocaine-prilocaine (EMLA) cream 1 application  1 application Topical PRN Dew, Erskine Squibb, MD       LORazepam (ATIVAN) injection 2 mg  2 mg Intravenous Q1H PRN Algernon Huxley, MD       methylPREDNISolone sodium succinate (SOLU-MEDROL) 125 mg/2 mL injection 80 mg  80 mg Intravenous Q12H Algernon Huxley, MD   80 mg at 08/26/21 0847   multivitamin (RENA-VIT) tablet 1 tablet  1 tablet Oral QHS Lorella Nimrod, MD   1 tablet at 08/25/21 2204   NIFEdipine (PROCARDIA-XL/NIFEDICAL-XL) 24 hr tablet 90 mg  90 mg Oral Daily Algernon Huxley, MD   90 mg at 08/26/21 1035   ondansetron (ZOFRAN) injection 4 mg  4 mg Intravenous Q6H PRN Algernon Huxley, MD   4 mg at 08/23/21 1257   ondansetron (ZOFRAN) injection 4 mg  4 mg Intravenous Q6H PRN Algernon Huxley, MD       oxyCODONE (Oxy IR/ROXICODONE) immediate release tablet 5-10 mg  5-10 mg Oral Q4H PRN Algernon Huxley, MD   10 mg at 08/24/21 2318   pantoprazole (PROTONIX) EC tablet 40 mg  40 mg Oral Daily Tawnya Crook, RPH   40 mg at 08/26/21 1038   pentafluoroprop-tetrafluoroeth (GEBAUERS) aerosol 1 application  1 application Topical PRN Dew, Erskine Squibb, MD       polyethylene glycol (MIRALAX / GLYCOLAX) packet 17 g  17 g Oral Daily PRN Algernon Huxley, MD   17 g at 08/22/21 1344   pravastatin (PRAVACHOL) tablet 20 mg  20 mg Oral Daily Algernon Huxley, MD   20 mg at 08/26/21 1035   spironolactone (ALDACTONE) tablet 12.5 mg  12.5 mg Oral Daily Kate Sable, MD   12.5 mg at 08/26/21 1035     Review of Systems Full ROS  was asked and was negative except for the information on the HPI  Physical Exam Blood pressure (!) 144/108, pulse 95, temperature 98.7 F (37.1 C), temperature source Oral, resp. rate 11, height 5\' 6"  (1.676 m), weight 67.8 kg, SpO2 94 %. CONSTITUTIONAL: NAD. EYES: Pupils are equal, round, Sclera are non-icteric. EARS, NOSE, MOUTH AND THROAT: She is wearing a mask. Hearing is intact to voice. LYMPH NODES:  Lymph nodes in the neck are normal. RESPIRATORY:   Lungs are clear. There is normal respiratory effort, with equal breath sounds bilaterally, and without pathologic use of accessory muscles. CARDIOVASCULAR: Heart is regular without  murmurs, gallops, or rubs. GI: The abdomen is  soft, nontender, and nondistended. There are no palpable masses. There is no hepatosplenomegaly. There are normal bowel sounds GU: Rectal deferred.   MUSCULOSKELETAL: Normal muscle strength and tone. No cyanosis or edema.   SKIN: Turgor is good and there are no pathologic skin lesions or ulcers. NEUROLOGIC: Motor and sensation is grossly normal. Cranial nerves are grossly intact. PSYCH:  Oriented to person, place and time. Affect is normal.  Data Reviewed  I have personally reviewed the patient's imaging, laboratory findings and medical records.    Assessment/Plan 20 year old female with progressive renal failure and complex history. THey are interested in moving forward  PD at home. D/W the pt in detail about lap PD placement. Risks, benefits and possible complications including but not limited to infection, malfunction, bowel injuries , pan and potential reintervention. They are in agreement.. We will hold heparin sub q today, Monitor PL and plan to perform surgery tomorrow. I spent > 70 minutes in this encounter including coordination of care, placing orders, counseling the pt and performing appropriate documentation  Caroleen Hamman, MD FACS General Surgeon 08/26/2021, 11:06 AM

## 2021-08-26 NOTE — Progress Notes (Signed)
Progress Note  Patient Name: Melinda Steele Date of Encounter: 08/26/2021  Primary Cardiologist: [place] well this AM.  Breathing stable.  Sleeps w/ HOB @ 30 deg, but says this is usual for her.  Denies orthopnea.  Parents @ bedside.  Questions answered.  Inpatient Medications    Scheduled Meds:  carvedilol  25 mg Oral BID WC   Chlorhexidine Gluconate Cloth  6 each Topical Q0600   Chlorhexidine Gluconate Cloth  6 each Topical Q0600   chlorthalidone  50 mg Oral Daily   cloNIDine  0.3 mg Transdermal Weekly   feeding supplement (NEPRO CARB STEADY)  237 mL Oral TID BM   heparin  5,000 Units Subcutaneous Q8H   hydroxychloroquine  400 mg Oral Daily   lacosamide  150 mg Oral BID   methylPREDNISolone (SOLU-MEDROL) injection  80 mg Intravenous Q12H   multivitamin  1 tablet Oral QHS   NIFEdipine  90 mg Oral Daily   pantoprazole  40 mg Oral Daily   pravastatin  20 mg Oral Daily   spironolactone  12.5 mg Oral Daily   Continuous Infusions:  sodium chloride     sodium chloride     cefTRIAXone (ROCEPHIN)  IV Stopped (08/25/21 1930)   PRN Meds: sodium chloride, sodium chloride, acetaminophen, alteplase, bismuth subsalicylate, docusate sodium, heparin, HYDROmorphone (DILAUDID) injection, labetalol, levalbuterol, lidocaine (PF), lidocaine-prilocaine, LORazepam, ondansetron (ZOFRAN) IV, ondansetron (ZOFRAN) IV, oxyCODONE, pentafluoroprop-tetrafluoroeth, polyethylene glycol   Vital Signs    Vitals:   08/26/21 0200 08/26/21 0300 08/26/21 0400 08/26/21 0700  BP: (!) 143/103 (!) 148/110 (!) 148/111 (!) 138/100  Pulse: 90 84 90 89  Resp: (!) 22 (!) 23 (!) 21 (!) 6  Temp:      TempSrc:      SpO2: 96% 96% 97% 96%  Weight:      Height:        Intake/Output Summary (Last 24 hours) at 08/26/2021 0818 Last data filed at 08/25/2021 1808 Gross per 24 hour  Intake 200 ml  Output 511 ml  Net -311 ml   Filed Weights   08/24/21 1230 08/25/21 1426 08/25/21 1831  Weight: 65.9  kg 68.9 kg 67.8 kg    Physical Exam   GEN: Well nourished, well developed, in no acute distress.  HEENT: Grossly normal.  Neck: Supple, no JVD, carotid bruits, or masses. Cardiac: RRR, +S4, soft rub, no murmurs. No clubbing, cyanosis, edema.  Radials 2+, DP/PT 2+ and equal bilaterally.  Respiratory:  Respirations regular and unlabored, clear to auscultation bilaterally. GI: Soft, nontender, nondistended, BS + x 4. MS: no deformity or atrophy. Skin: warm and dry, no rash. Neuro:  Strength and sensation are intact. Psych: AAOx3.  Flat affect.  Labs    Chemistry Recent Labs  Lab 08/21/21 0100 08/21/21 0531 08/23/21 0548 08/23/21 1110 08/24/21 0416 08/25/21 0414 08/26/21 0406  NA 137   < > 135  --  134* 134*  133* 135  K 5.1   < > 5.7*   < > 4.9 4.5  4.5 4.3  CL 110   < > 106  --  101 100  99 100  CO2 17*   < > 18*  --  21* 26  25 25   GLUCOSE 80   < > 142*  --  179* 163*  166* 144*  BUN 86*   < > 89*  --  89* 76*  76* 43*  CREATININE 7.89*   < > 9.11*  --  8.17* 6.18*  6.09* 3.71*  CALCIUM 7.7*   < > 8.2*  --  7.8* 7.6*  7.5* 7.8*  PROT 6.2*  --  6.1*  --   --  5.5*  --   ALBUMIN 3.2*  --  2.7*  --  2.8* 2.8*  2.7* 2.8*  AST 20  --  22  --   --  21  --   ALT 15  --  17  --   --  24  --   ALKPHOS 43  --  48  --   --  69  --   BILITOT 0.6  --  0.9  --   --  1.0  --   GFRNONAA 7*   < > 6*  --  7* 9*  9* 17*  ANIONGAP 10   < > 11  --  12 8  9 10    < > = values in this interval not displayed.     Hematology Recent Labs  Lab 08/24/21 0416 08/25/21 0414 08/26/21 0406  WBC 9.3 10.7* 7.6  RBC 2.61* 2.73* 2.81*  HGB 7.5* 7.8* 7.8*  HCT 21.9* 23.5* 24.4*  MCV 83.9 86.1 86.8  MCH 28.7 28.6 27.8  MCHC 34.2 33.2 32.0  RDW 13.9 13.2 12.8  PLT 110* 100* 82*    Cardiac Enzymes  Recent Labs  Lab 08/21/21 0101 08/21/21 0531 08/21/21 0957  TROPONINIHS 538* 619* 645*      BNP Recent Labs  Lab 08/21/21 0101 08/21/21 0531  BNP >4,500.0* >4,500.0*    Lipids  Lab Results  Component Value Date   CHOL 129 08/21/2021   HDL 42 08/21/2021   LDLCALC 67 08/21/2021   TRIG 102 08/21/2021   CHOLHDL 3.1 08/21/2021   Radiology    CT ABDOMEN PELVIS WO CONTRAST  Result Date: 08/22/2021 CLINICAL DATA:  Abdominal distension Nausea/vomiting Abdominal pain, acute, nonlocalized EXAM: CT ABDOMEN AND PELVIS WITHOUT CONTRAST TECHNIQUE: Multidetector CT imaging of the abdomen and pelvis was performed following the standard protocol without IV contrast. COMPARISON:  Chest x-ray 08/21/2021 FINDINGS: Lower chest: Marked peribronchovascular ground-glass airspace opacities of the visualized lung. At least small volume pericardial effusion partially visualized. Trace right pleural effusion. Hepatobiliary: No focal liver abnormality. Likely question gallbladder wall thickening with markedly limited evaluation on this noncontrast study. No biliary dilatation. Query mild periportal edema. Pancreas: No focal lesion. Normal pancreatic contour. No surrounding inflammatory changes. No main pancreatic ductal dilatation. Spleen: Normal in size without focal abnormality. Adrenals/Urinary Tract: No adrenal nodule bilaterally. No nephrolithiasis and no hydronephrosis. No definite contour-deforming renal mass. No ureterolithiasis or hydroureter. The urinary bladder is unremarkable. Stomach/Bowel: Stomach is within normal limits. No evidence of bowel wall thickening or dilatation. Appendix appears normal. Vascular/Lymphatic: No abdominal aorta or iliac aneurysm. No abdominal, pelvic, or inguinal lymphadenopathy. Reproductive: Uterus and bilateral adnexa are unremarkable. Other: Trace free fluid within the abdomen and pelvis. No intraperitoneal free gas. No organized fluid collection. Musculoskeletal: No abdominal wall hernia or abnormality. No suspicious lytic or blastic osseous lesions. No acute displaced fracture. IMPRESSION: 1. Pulmonary findings suggestive of infection/inflammation  versus edema versus alveolar hemorrhage. 2. Partially visualized at least small volume pericardial effusion. 3. Trace right pleural effusion. 4. Likely gallbladder wall thickening with markedly limited evaluation on this noncontrast study. Recommend right upper quadrant ultrasound for further evaluation. 5. Trace simple free fluid within the abdomen and pelvis. Electronically Signed   By: Iven Finn M.D.   On: 08/22/2021 23:59   US Abdomen Limited RUQ (LIVER/GB)  Result Date: 08/23/2021 CLINICAL DATA:  History of chronic renal disease with gallbladder wall thickening seen on recent abdomen pelvis CT. EXAM: ULTRASOUND ABDOMEN LIMITED RIGHT UPPER QUADRANT COMPARISON:  Abdomen pelvis CT, dated August 22, 2021. FINDINGS: Gallbladder: A thin, curvilinear nonshadowing echogenic area is seen along the wall of the gallbladder fundus. No gallstones or wall thickening visualized (2.8 mm). No sonographic Murphy sign noted by sonographer. Common bile duct: Diameter: 4.2 mm Liver: No focal lesion identified. Within normal limits in parenchymal echogenicity. Portal vein is patent on color Doppler imaging with normal direction of blood flow towards the liver. Other: Of incidental note is the presence a right-sided pleural effusion and an echogenic right kidney. IMPRESSION: 1. Thin, echogenic area along the wall of the gallbladder fundus which may represent very mild gallbladder wall calcification. 2. No evidence of acute cholecystitis. Further evaluation with a nuclear medicine hepatobiliary scan is recommended if this is of clinical concern. 3. Small right pleural effusion. 4. Echogenic right kidney which may be secondary to medical renal disease related to the patient's history of chronic renal disease. Electronically Signed   By: Virgina Norfolk M.D.   On: 08/23/2021 02:15    Telemetry    RSR - Personally Reviewed  Cardiac Studies   TTE (08/22/2021):  1. Moderate pericardial effusion. The pericardial  effusion is posterior  and lateral to the left ventricle. There is no evidence of cardiac  tamponade.   2. Left ventricular ejection fraction, by estimation, is 30 to 35%. The  left ventricle has moderately decreased function. The left ventricle  demonstrates global hypokinesis. There is mild left ventricular  hypertrophy. Left ventricular diastolic  parameters are consistent with Grade II diastolic dysfunction  (pseudonormalization). The average left ventricular global longitudinal  strain is -5.1 %. The global longitudinal strain is abnormal.   3. Right ventricular systolic function is normal. The right ventricular  size is normal. There is normal pulmonary artery systolic pressure. The  estimated right ventricular systolic pressure is 38.1 mmHg.   4. Left atrial size was moderately dilated.   5. Right atrial size was mildly dilated.   6. The mitral valve is normal in structure. Mild mitral valve  regurgitation. No evidence of mitral stenosis.   7. Tricuspid valve regurgitation is moderate.   8. The inferior vena cava is normal in size with greater than 50%  respiratory variability, suggesting right atrial pressure of 3 mmHg.   Patient Profile     20 y.o. female with a hx of seizures, lupus, chronic systolic heart failure, glomerulonephritis, CKD 3B, hemolytic anemia and chronic pericardial effusions who was admitted 11/26 2/2 resp failure and asp pna that developed after a seizure.  She was found to be anemic (Hgb 5.9) and req PRBCs.  Echo showed new LV dysfxn w/ EF of 30-35% and moderate pericardial eff w/o tamponade.  Assessment & Plan    1.  Moderate pericardial effusion/Recurrent pericardial effusion: Pt w/ a h/o SLE and recurrent pericardial effusions followed @ [facility] and s/p prior pericardiocenteses.  Admitted 11/26 w/ resp failure following seizure.  Echo w/ mod pericardial effusion w/o tamponade.  She has been hemodynamically stable and is currently w/o dyspnea/orthopnea/chest  pain.  No role for pericardiocentesis at this time.  We will plan for a repeat limited echo prior to discharge.    2.  Acute resp failure/Aspiration PNA:  Resp status stable.  Abx/steroids per IM.  3.  Acute HFrEF/Cardiomyopathy:  EF 30-35% by echo this admission.  Prev nl.  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  Appears euvolemic on exam, though S4 noted.  On multiple antihypertensives including ? blocker and spiro.  No acei/arb/arni in the setting of acute on chronic renal failure, but will need to consider entresto if disposition is to HD/PD.  Otw, will consider hyrdal/nitrate for afterload reduction.  Pt currently on chlorthalidone - defer diuretic choice to nephrology.  4.  Essential HTN:  BP higher this AM.  As above, multiple antihypertensives on board.  Follow.    5.  Normocytic Anemia: s/p PRBCs.  Relatively stable, though H/H cont to trend low @ 7.8/24.4 this AM.  6.  Acute on chronic renal failure/Lupus nephritis:  HD initiated this admission. Volume mgmt per nephrology.  Pending lap PD catheter placement tomorrow.  No further cardiac eval required to surgery but will have to continue to watch volume status closely.  Cont ? blocker.  7.  SLE:  Rheumatology following.  Signed, Murray Hodgkins, NP  08/26/2021, 8:18 AM    For questions or updates, please contact   Please consult [email or web address] for contact info under Cardiology/STEMI.

## 2021-08-27 ENCOUNTER — Inpatient Hospital Stay: Payer: Medicaid Other | Admitting: Anesthesiology

## 2021-08-27 ENCOUNTER — Encounter
Admission: EM | Disposition: A | Discharge status: Other Acute Inpatient Hospital | Payer: Self-pay | Source: Home / Self Care | Attending: Internal Medicine

## 2021-08-27 ENCOUNTER — Encounter: Payer: Self-pay | Admitting: Pulmonary Disease

## 2021-08-27 DIAGNOSIS — N186 End stage renal disease: Secondary | ICD-10-CM

## 2021-08-27 DIAGNOSIS — Z992 Dependence on renal dialysis: Secondary | ICD-10-CM | POA: Diagnosis not present

## 2021-08-27 DIAGNOSIS — J189 Pneumonia, unspecified organism: Secondary | ICD-10-CM

## 2021-08-27 DIAGNOSIS — D589 Hereditary hemolytic anemia, unspecified: Secondary | ICD-10-CM | POA: Diagnosis not present

## 2021-08-27 DIAGNOSIS — J9601 Acute respiratory failure with hypoxia: Secondary | ICD-10-CM | POA: Diagnosis not present

## 2021-08-27 DIAGNOSIS — N179 Acute kidney failure, unspecified: Secondary | ICD-10-CM | POA: Diagnosis not present

## 2021-08-27 HISTORY — PX: CAPD INSERTION: SHX5233

## 2021-08-27 LAB — HCG, QUANTITATIVE, PREGNANCY: hCG, Beta Chain, Quant, S: 2 m[IU]/mL (ref <5)

## 2021-08-27 SURGERY — LAPAROSCOPIC INSERTION CONTINUOUS AMBULATORY PERITONEAL DIALYSIS  (CAPD) CATHETER
Anesthesia: General

## 2021-08-27 MED ORDER — MIDAZOLAM HCL 2 MG/2ML IJ SOLN
INTRAMUSCULAR | Status: DC | PRN
  Administered 2021-08-27: 2 mg via INTRAVENOUS

## 2021-08-27 MED ORDER — HEPARIN SODIUM (PORCINE) 1000 UNIT/ML IJ SOLN
INTRAMUSCULAR | Status: AC
  Filled 2021-08-27: qty 10

## 2021-08-27 MED ORDER — SODIUM CHLORIDE 0.9 % IV SOLN: INTRAVENOUS | Status: DC

## 2021-08-27 MED ORDER — BUPIVACAINE LIPOSOME 1.3 % IJ SUSP
INTRAMUSCULAR | Status: AC
  Filled 2021-08-27: qty 20

## 2021-08-27 MED ORDER — ISOSORB DINITRATE-HYDRALAZINE 20-37.5 MG PO TABS
1.0000 | ORAL_TABLET | Freq: Three times a day (TID) | ORAL | Status: DC
  Administered 2021-08-28: 1 via ORAL
  Filled 2021-08-27 (×7): qty 1

## 2021-08-27 MED ORDER — OXYCODONE HCL 5 MG PO TABS
5.0000 mg | ORAL_TABLET | ORAL | Status: DC | PRN
  Administered 2021-08-28: 5 mg via ORAL
  Filled 2021-08-27: qty 1

## 2021-08-27 MED ORDER — LIDOCAINE HCL (CARDIAC) PF 100 MG/5ML IV SOSY
PREFILLED_SYRINGE | INTRAVENOUS | Status: DC | PRN
  Administered 2021-08-27: 20 mg via INTRAVENOUS

## 2021-08-27 MED ORDER — ONDANSETRON HCL 4 MG/2ML IJ SOLN: 4.0000 mg | Freq: Once | INTRAMUSCULAR | Status: DC | PRN

## 2021-08-27 MED ORDER — MIDAZOLAM HCL 2 MG/2ML IJ SOLN
INTRAMUSCULAR | Status: AC
  Filled 2021-08-27: qty 2

## 2021-08-27 MED ORDER — FENTANYL CITRATE (PF) 100 MCG/2ML IJ SOLN
25.0000 ug | INTRAMUSCULAR | Status: DC | PRN
  Administered 2021-08-27 (×3): 25 ug via INTRAVENOUS

## 2021-08-27 MED ORDER — ORAL CARE MOUTH RINSE: 15.0000 mL | Freq: Once | OROMUCOSAL | Status: DC

## 2021-08-27 MED ORDER — HEPARIN SODIUM (PORCINE) 10000 UNIT/ML IJ SOLN
INTRAMUSCULAR | Status: AC
  Filled 2021-08-27: qty 1

## 2021-08-27 MED ORDER — HEPARIN SOD (PORK) LOCK FLUSH 10 UNIT/ML IV SOLN
INTRAVENOUS | Status: DC | PRN
  Administered 2021-08-27: 10000 [IU] via INTRAPERITONEAL

## 2021-08-27 MED ORDER — ACETAMINOPHEN 10 MG/ML IV SOLN: 1000.0000 mg | Freq: Once | INTRAVENOUS | Status: DC | PRN

## 2021-08-27 MED ORDER — LABETALOL HCL 5 MG/ML IV SOLN
10.0000 mg | Freq: Once | INTRAVENOUS | Status: AC
  Administered 2021-08-27: 10 mg via INTRAVENOUS

## 2021-08-27 MED ORDER — LABETALOL HCL 5 MG/ML IV SOLN
INTRAVENOUS | Status: AC
  Filled 2021-08-27: qty 4

## 2021-08-27 MED ORDER — OXYCODONE HCL 5 MG PO TABS
ORAL_TABLET | ORAL | Status: AC
  Filled 2021-08-27: qty 1

## 2021-08-27 MED ORDER — DEXAMETHASONE SODIUM PHOSPHATE 10 MG/ML IJ SOLN
INTRAMUSCULAR | Status: DC | PRN
  Administered 2021-08-27: 5 mg via INTRAVENOUS

## 2021-08-27 MED ORDER — CHLORHEXIDINE GLUCONATE 0.12 % MT SOLN: 15.0000 mL | Freq: Once | OROMUCOSAL | Status: DC

## 2021-08-27 MED ORDER — BUPIVACAINE-EPINEPHRINE 0.25% -1:200000 IJ SOLN
INTRAMUSCULAR | Status: DC | PRN
  Administered 2021-08-27: 50 mL

## 2021-08-27 MED ORDER — OXYCODONE HCL 5 MG PO TABS
5.0000 mg | ORAL_TABLET | Freq: Once | ORAL | Status: DC | PRN
  Administered 2021-08-27: 5 mg via ORAL

## 2021-08-27 MED ORDER — FENTANYL CITRATE (PF) 100 MCG/2ML IJ SOLN
INTRAMUSCULAR | Status: DC | PRN
  Administered 2021-08-27: 50 ug via INTRAVENOUS
  Administered 2021-08-27: 25 ug via INTRAVENOUS
  Administered 2021-08-27: 50 ug via INTRAVENOUS

## 2021-08-27 MED ORDER — OXYCODONE HCL 5 MG/5ML PO SOLN: 5.0000 mg | Freq: Once | ORAL | Status: DC | PRN

## 2021-08-27 MED ORDER — FENTANYL CITRATE (PF) 100 MCG/2ML IJ SOLN
INTRAMUSCULAR | Status: AC
  Filled 2021-08-27: qty 2

## 2021-08-27 MED ORDER — 0.9 % SODIUM CHLORIDE (POUR BTL) OPTIME
TOPICAL | Status: DC | PRN
  Administered 2021-08-27: 200 mL

## 2021-08-27 MED ORDER — LABETALOL HCL 5 MG/ML IV SOLN
20.0000 mg | INTRAVENOUS | Status: DC | PRN
  Administered 2021-08-27: 20 mg via INTRAVENOUS

## 2021-08-27 MED ORDER — CEFAZOLIN SODIUM-DEXTROSE 1-4 GM/50ML-% IV SOLN
INTRAVENOUS | Status: DC | PRN
  Administered 2021-08-27: 1 g via INTRAVENOUS

## 2021-08-27 MED ORDER — PROPOFOL 10 MG/ML IV BOLUS
INTRAVENOUS | Status: DC | PRN
  Administered 2021-08-27: 100 mg via INTRAVENOUS

## 2021-08-27 MED ORDER — ROCURONIUM BROMIDE 100 MG/10ML IV SOLN
INTRAVENOUS | Status: DC | PRN
  Administered 2021-08-27: 50 mg via INTRAVENOUS

## 2021-08-27 MED ORDER — BUPIVACAINE-EPINEPHRINE (PF) 0.25% -1:200000 IJ SOLN
INTRAMUSCULAR | Status: AC
  Filled 2021-08-27: qty 30

## 2021-08-27 MED ORDER — ONDANSETRON HCL 4 MG/2ML IJ SOLN
INTRAMUSCULAR | Status: DC | PRN
  Administered 2021-08-27: 4 mg via INTRAVENOUS

## 2021-08-27 MED ORDER — SUGAMMADEX SODIUM 200 MG/2ML IV SOLN
INTRAVENOUS | Status: DC | PRN
  Administered 2021-08-27: 200 mg via INTRAVENOUS

## 2021-08-27 MED ORDER — LACTATED RINGERS IV SOLN: INTRAVENOUS | Status: DC | PRN

## 2021-08-27 MED ORDER — LACTATED RINGERS IV SOLN: INTRAVENOUS | Status: DC

## 2021-08-27 MED ORDER — SACUBITRIL-VALSARTAN 24-26 MG PO TABS
1.0000 | ORAL_TABLET | Freq: Two times a day (BID) | ORAL | Status: DC
  Administered 2021-08-28: 1 via ORAL
  Filled 2021-08-27 (×5): qty 1

## 2021-08-27 SURGICAL SUPPLY — 55 items
ADAPTER CATH DIALYSIS 18.75 (CATHETERS) IMPLANT
ADAPTER CATH DIALYSIS 4X8 IT L (MISCELLANEOUS) ×2 IMPLANT
BIOPATCH WHT 1IN DISK W/4.0 H (GAUZE/BANDAGES/DRESSINGS) ×2 IMPLANT
BLADE CLIPPER SURG (BLADE) IMPLANT
CATH EXTENDED DIALYSIS (CATHETERS) ×2 IMPLANT
CHLORAPREP W/TINT 26 (MISCELLANEOUS) ×2 IMPLANT
DEFOGGER SCOPE WARMER CLEARIFY (MISCELLANEOUS) ×2 IMPLANT
DERMABOND ADVANCED (GAUZE/BANDAGES/DRESSINGS) ×2
DERMABOND ADVANCED .7 DNX12 (GAUZE/BANDAGES/DRESSINGS) ×2 IMPLANT
ELECT CAUTERY BLADE 6.4 (BLADE) ×2 IMPLANT
ELECT REM PT RETURN 9FT ADLT (ELECTROSURGICAL) ×2
ELECTRODE REM PT RTRN 9FT ADLT (ELECTROSURGICAL) ×1 IMPLANT
GAUZE 4X4 16PLY ~~LOC~~+RFID DBL (SPONGE) ×2 IMPLANT
GAUZE SPONGE 4X4 12PLY STRL (GAUZE/BANDAGES/DRESSINGS) ×2 IMPLANT
GLOVE SURG ENC MOIS LTX SZ7 (GLOVE) ×2 IMPLANT
GOWN STRL REUS W/ TWL LRG LVL3 (GOWN DISPOSABLE) ×2 IMPLANT
GOWN STRL REUS W/TWL LRG LVL3 (GOWN DISPOSABLE) ×2
GRASPER SUT TROCAR 14GX15 (MISCELLANEOUS) ×2 IMPLANT
IV NS 1000ML (IV SOLUTION) ×1
IV NS 1000ML BAXH (IV SOLUTION) ×1 IMPLANT
KIT TURNOVER KIT A (KITS) ×2 IMPLANT
MANIFOLD NEPTUNE II (INSTRUMENTS) ×2 IMPLANT
MINICAP W/POVIDONE IODINE SOL (MISCELLANEOUS) ×2 IMPLANT
NDL SAFETY ECLIPSE 18X1.5 (NEEDLE) ×1 IMPLANT
NEEDLE HYPO 18GX1.5 SHARP (NEEDLE) ×1
NEEDLE HYPO 22GX1.5 SAFETY (NEEDLE) ×2 IMPLANT
NEEDLE INSUFFLATION 14GA 120MM (NEEDLE) ×2 IMPLANT
NS IRRIG 500ML POUR BTL (IV SOLUTION) ×2 IMPLANT
PACK LAP CHOLECYSTECTOMY (MISCELLANEOUS) ×2 IMPLANT
PENCIL ELECTRO HAND CTR (MISCELLANEOUS) ×2 IMPLANT
SET CYSTO W/LG BORE CLAMP LF (SET/KITS/TRAYS/PACK) ×2 IMPLANT
SET TRANSFER 6 W/TWIST CLAMP 5 (SET/KITS/TRAYS/PACK) ×2 IMPLANT
SET TUBE SMOKE EVAC HIGH FLOW (TUBING) ×2 IMPLANT
SLEEVE ADV FIXATION 5X100MM (TROCAR) ×2 IMPLANT
SPONGE DRAIN TRACH 4X4 STRL 2S (GAUZE/BANDAGES/DRESSINGS) ×2 IMPLANT
SPONGE T-LAP 18X18 ~~LOC~~+RFID (SPONGE) ×4 IMPLANT
STYLET FALLER (MISCELLANEOUS) ×2 IMPLANT
STYLET FALLER MEDIONICS (MISCELLANEOUS) IMPLANT
SUT ETHILON 3-0 FS-10 30 BLK (SUTURE) ×2
SUT MNCRL 4-0 (SUTURE) ×1
SUT MNCRL 4-0 27XMFL (SUTURE) ×1
SUT VIC AB 3-0 SH 27 (SUTURE) ×1
SUT VIC AB 3-0 SH 27X BRD (SUTURE) ×1 IMPLANT
SUT VICRYL 0 AB UR-6 (SUTURE) ×4 IMPLANT
SUTURE EHLN 3-0 FS-10 30 BLK (SUTURE) ×1 IMPLANT
SUTURE MNCRL 4-0 27XMF (SUTURE) ×1 IMPLANT
SYR 20ML LL LF (SYRINGE) ×2 IMPLANT
SYR 3ML LL SCALE MARK (SYRINGE) ×2 IMPLANT
SYR TOOMEY IRRIG 70ML (MISCELLANEOUS) ×2
SYRINGE TOOMEY IRRIG 70ML (MISCELLANEOUS) ×1 IMPLANT
SYS KII FIOS ACCESS ABD 5X100 (TROCAR) ×2
SYSTEM KII FIOS ACES ABD 5X100 (TROCAR) ×1 IMPLANT
TAPE CLOTH SURG 4X10 WHT LF (GAUZE/BANDAGES/DRESSINGS) ×2 IMPLANT
TROCAR XCEL NON-BLD 5MMX100MML (ENDOMECHANICALS) ×2 IMPLANT
WATER STERILE IRR 500ML POUR (IV SOLUTION) ×2 IMPLANT

## 2021-08-27 NOTE — Progress Notes (Signed)
Notified Dr. Reesa Chew hospitalist that patient remains hypertensive. Orders to repeat labatolol dose Iv and take patient to floor.

## 2021-08-27 NOTE — Progress Notes (Signed)
Progress Note  Patient Name: Melinda Steele Date of Encounter: 08/27/2021  [facility] Cardiologist: new to [facility]-followed at [facility]  Subjective   No complaints this morning, resting comfortably, family at the bedside Plan for peritoneal dialysis catheter placement today Denies shortness of breath, no chest pain Blood pressure running high   Inpatient Medications    Scheduled Meds:  [MAR Hold] carvedilol  25 mg Oral BID WC   chlorhexidine  15 mL Mouth/Throat Once   Or   mouth rinse  15 mL Mouth Rinse Once   [MAR Hold] Chlorhexidine Gluconate Cloth  6 each Topical Q0600   [MAR Hold] Chlorhexidine Gluconate Cloth  6 each Topical Q0600   [MAR Hold] cloNIDine  0.3 mg Transdermal Weekly   [MAR Hold] feeding supplement (NEPRO CARB STEADY)  237 mL Oral TID BM   fentaNYL       [MAR Hold] hydroxychloroquine  400 mg Oral Daily   [MAR Hold] isosorbide-hydrALAZINE  1 tablet Oral TID   labetalol       labetalol  10 mg Intravenous Once   [MAR Hold] lacosamide  150 mg Oral BID   [MAR Hold] methylPREDNISolone (SOLU-MEDROL) injection  80 mg Intravenous Q24H   [MAR Hold] multivitamin  1 tablet Oral QHS   [MAR Hold] pantoprazole  40 mg Oral Daily   [MAR Hold] pravastatin  20 mg Oral Daily   [MAR Hold] sacubitril-valsartan  1 tablet Oral BID   [MAR Hold] spironolactone  12.5 mg Oral Daily   Continuous Infusions:  [MAR Hold] sodium chloride     [MAR Hold] sodium chloride     sodium chloride 10 mL/hr at 08/27/21 1202   acetaminophen     lactated ringers     PRN Meds: [MAR Hold] sodium chloride, [MAR Hold] sodium chloride, acetaminophen, [MAR Hold] acetaminophen, [MAR Hold] alteplase, [MAR Hold] bismuth subsalicylate, [MAR Hold] docusate sodium, fentaNYL (SUBLIMAZE) injection, [MAR Hold] heparin, [MAR Hold]  HYDROmorphone (DILAUDID) injection, [MAR Hold] labetalol, [MAR Hold] levalbuterol, [MAR Hold] lidocaine (PF), [MAR Hold] lidocaine-prilocaine, [MAR Hold] LORazepam, [MAR Hold] ondansetron  (ZOFRAN) IV, [MAR Hold] ondansetron (ZOFRAN) IV, ondansetron (ZOFRAN) IV, oxyCODONE **OR** oxyCODONE, [MAR Hold] oxyCODONE, [MAR Hold] pentafluoroprop-tetrafluoroeth, [MAR Hold] polyethylene glycol   Vital Signs    Vitals:   08/26/21 2000 08/27/21 0830 08/27/21 1150 08/27/21 1419  BP: (!) 130/93 (!) 150/108 (!) 154/110 (!) 170/121  Pulse: 88 82 83 96  Resp: 18 18 16 16   Temp: 98.6 F (37 C) 98.7 F (37.1 C) 98.6 F (37 C) 98.6 F (37 C)  TempSrc: Oral Oral Temporal   SpO2: 99% 99% 98% 99%  Weight:      Height:        Intake/Output Summary (Last 24 hours) at 08/27/2021 1443 Last data filed at 08/27/2021 1409 Gross per 24 hour  Intake 470 ml  Output --  Net 470 ml   Last 3 Weights 08/25/2021 08/25/2021 08/24/2021  Weight (lbs) 149 lb 7.6 oz 151 lb 14.4 oz 145 lb 4.5 oz  Weight (kg) 67.8 kg 68.9 kg 65.9 kg      Telemetry    Normal sinus rhythm- Personally Reviewed  ECG    - Personally Reviewed  Physical Exam   GEN: No acute distress.   Neck:  JVD 8-10 Cardiac: RRR, no murmurs, rubs, or gallops.  Respiratory: Clear to auscultation bilaterally. GI: Soft, nontender, non-distended  MS: No edema; No deformity. Neuro:  Nonfocal  Psych: Normal affect   Labs    High Sensitivity Troponin:  Recent Labs  Lab 08/21/21 0101 08/21/21 0531 08/21/21 0957  TROPONINIHS 538* 619* 645*     Chemistry Recent Labs  Lab 08/21/21 0100 08/21/21 0531 08/22/21 0409 08/22/21 2008 08/23/21 0548 08/23/21 1110 08/24/21 0416 08/25/21 0414 08/26/21 0406  NA 137   < > 137  --  135  --  134* 134*  133* 135  K 5.1   < > 6.1*   < > 5.7*   < > 4.9 4.5  4.5 4.3  CL 110   < > 106  --  106  --  101 100  99 100  CO2 17*   < > 20*  --  18*  --  21* 26  25 25   GLUCOSE 80   < > 133*  --  142*  --  179* 163*  166* 144*  BUN 86*   < > 97*  --  89*  --  89* 76*  76* 43*  CREATININE 7.89*   < > 8.78*  --  9.11*  --  8.17* 6.18*  6.09* 3.71*  CALCIUM 7.7*   < > 7.7*  --  8.2*  --   7.8* 7.6*  7.5* 7.8*  MG  --    < > 2.9*  --  2.7*  --  2.3  --   --   PROT 6.2*  --   --   --  6.1*  --   --  5.5*  --   ALBUMIN 3.2*  --   --   --  2.7*  --  2.8* 2.8*  2.7* 2.8*  AST 20  --   --   --  22  --   --  21  --   ALT 15  --   --   --  17  --   --  24  --   ALKPHOS 43  --   --   --  48  --   --  69  --   BILITOT 0.6  --   --   --  0.9  --   --  1.0  --   GFRNONAA 7*   < > 6*  --  6*  --  7* 9*  9* 17*  ANIONGAP 10   < > 11  --  11  --  12 8  9 10    < > = values in this interval not displayed.    Lipids  Recent Labs  Lab 08/21/21 0531  CHOL 129  TRIG 102  HDL 42  LDLCALC 67  CHOLHDL 3.1    Hematology Recent Labs  Lab 08/24/21 0416 08/25/21 0414 08/26/21 0406  WBC 9.3 10.7* 7.6  RBC 2.61* 2.73* 2.81*  HGB 7.5* 7.8* 7.8*  HCT 21.9* 23.5* 24.4*  MCV 83.9 86.1 86.8  MCH 28.7 28.6 27.8  MCHC 34.2 33.2 32.0  RDW 13.9 13.2 12.8  PLT 110* 100* 82*   Thyroid No results for input(s): TSH, FREET4 in the last 168 hours.  BNP Recent Labs  Lab 08/21/21 0101 08/21/21 0531  BNP >4,500.0* >4,500.0*    DDimer No results for input(s): DDIMER in the last 168 hours.   Radiology    No results found.  Cardiac Studies     Patient Profile     20 y.o. female with a hx of seizures, lupus, chronic systolic heart failure, glomerulonephritis, CKD 3B, hemolytic anemia and chronic pericardial effusions who was admitted 11/26 2/2 resp failure and asp pna that developed after  a seizure.  She was found to be anemic (Hgb 5.9) and req PRBCs.  Echo showed new LV dysfxn w/ EF of 30-35% and moderate pericardial eff w/o tamponade.  Assessment & Plan    Moderate pericardial effusion Chronic issue, typically followed at [facility], Last evaluated September 2022, small size at that time Echocardiogram August 22, 2021 with eccentric, moderate sized LV free wall effusion around the inferoseptal wall noncircumferential, no tamponade -Should help symptoms will improve with treatment of  her lupus, and dialysis -Recommend outpatient follow-up with [facility]   Acute systolic dysfunction/cardiomyopathy Global hypokinesis this admission EF 30 to 35%, new finding compared to prior echocardiograms Likely nonischemic cardiomyopathy,  Through her regular outpatient cardiology team at [facility], could consider ischemic work-up -Continue beta-blocker, spironolactone We will stop the calcium channel blocker, start BiDil -Case discussed with nephrology, will stop chlorthalidone, start low-dose Entresto  Essential hypertension On clonidine patch, carvedilol,  spironolactone Stopping chlorthalidone, Starting low-dose Entresto, BiDil  Case discussed with nursing, ICU team, nephrology  Total encounter time more than 35 minutes  Greater than 50% was spent in counseling and coordination of care with the patient   For questions or updates, please contact [facility] Please consult [email or web address] for contact info under        Signed, Ida Rogue, MD  08/27/2021, 2:43 PM

## 2021-08-27 NOTE — Anesthesia Procedure Notes (Addendum)
Procedure Name: Intubation Date/Time: 08/27/2021 1:05 PM Performed by: Louann Sjogren, CRNA Pre-anesthesia Checklist: Patient identified, Patient being monitored, Timeout performed, Emergency Drugs available and Suction available Patient Re-evaluated:Patient Re-evaluated prior to induction Oxygen Delivery Method: Circle system utilized Preoxygenation: Pre-oxygenation with 100% oxygen Induction Type: IV induction Ventilation: Mask ventilation without difficulty Laryngoscope Size: Mac and 4 Grade View: Grade I Tube type: Oral Tube size: 7.0 mm Number of attempts: 1 Airway Equipment and Method: Stylet Placement Confirmation: ETT inserted through vocal cords under direct vision, positive ETCO2 and breath sounds checked- equal and bilateral Secured at: 21 cm Tube secured with: Tape Dental Injury: Teeth and Oropharynx as per pre-operative assessment

## 2021-08-27 NOTE — Progress Notes (Signed)
Preoperative Review   Patient is met in the preoperative holding area. The history is reviewed in the chart and with the patient. I personally reviewed the options and rationale as well as the risks of this procedure that have been previously discussed with the patient. All questions asked by the patient and/or family were answered to their satisfaction.  Patient agrees to proceed with this procedure at this time.  Brodyn Depuy M.D. FACS   

## 2021-08-27 NOTE — Progress Notes (Signed)
[facility]  ROUNDING NOTE   Subjective:  Patient due for peritoneal dialysis catheter placement today. Also due for hemodialysis treatment later today. Patient's grandparents at bedside this AM.   Objective:  Vital signs in last 24 hours:  Temp:  [98.6 F (37 C)-98.7 F (37.1 C)] 98.7 F (37.1 C) (12/02 0830) Pulse Rate:  [82-95] 82 (12/02 0830) Resp:  [18] 18 (12/02 0830) BP: (130-150)/(93-108) 150/108 (12/02 0830) SpO2:  [96 %-99 %] 99 % (12/02 0830)  Weight change:  Filed Weights   08/24/21 1230 08/25/21 1426 08/25/21 1831  Weight: 65.9 kg 68.9 kg 67.8 kg    Intake/Output: I/O last 3 completed shifts: In: 573.7 [P.O.:480; IV Piggyback:93.7] Out: -    Intake/Output this shift:  No intake/output data recorded.  Physical Exam: General: No acute distress  Head: Normocephalic, atraumatic. Moist oral mucosal membranes  Eyes: Anicteric  Neck: Supple  Lungs:  Clear to auscultation, normal effort  Heart: S1S2 no obvious rubs  Abdomen:  Soft, nontender, bowel sounds present  Extremities: No peripheral edema.  Neurologic: Awake, alert, conversant.  Skin: No acute rash  Access: Right IJ PermCath    Basic Metabolic Panel: Recent Labs  Lab 08/21/21 0531 08/22/21 0409 08/22/21 2008 08/23/21 0548 08/23/21 1110 08/24/21 0416 08/25/21 0414 08/26/21 0406  NA 139 137  --  135  --  134* 134*  133* 135  K 5.1 6.1*   < > 5.7* 5.6* 4.9 4.5  4.5 4.3  CL 112* 106  --  106  --  101 100  99 100  CO2 19* 20*  --  18*  --  21* 26  25 25   GLUCOSE 157* 133*  --  142*  --  179* 163*  166* 144*  BUN 88* 97*  --  89*  --  89* 76*  76* 43*  CREATININE 7.90* 8.78*  --  9.11*  --  8.17* 6.18*  6.09* 3.71*  CALCIUM 7.3* 7.7*  --  8.2*  --  7.8* 7.6*  7.5* 7.8*  MG 1.7 2.9*  --  2.7*  --  2.3  --   --   PHOS 4.4 5.8*  --  6.7* 7.2* 7.4*  7.4* 5.1* 4.7*   < > = values in this interval not displayed.     Liver Function Tests: Recent Labs  Lab 08/21/21 0100  08/23/21 0548 08/24/21 0416 08/25/21 0414 08/26/21 0406  AST 20 22  --  21  --   ALT 15 17  --  24  --   ALKPHOS 43 48  --  69  --   BILITOT 0.6 0.9  --  1.0  --   PROT 6.2* 6.1*  --  5.5*  --   ALBUMIN 3.2* 2.7* 2.8* 2.8*  2.7* 2.8*    No results for input(s): LIPASE, AMYLASE in the last 168 hours. No results for input(s): AMMONIA in the last 168 hours.  CBC: Recent Labs  Lab 08/21/21 0100 08/21/21 0531 08/22/21 0409 08/22/21 1626 08/23/21 0548 08/24/21 0416 08/25/21 0414 08/26/21 0406  WBC 20.1*   < > 32.8*  --  21.0* 9.3 10.7* 7.6  NEUTROABS 18.5*  --   --   --   --  8.6* 9.8* 6.8  HGB 7.4*   < > 5.9* 7.8* 8.1* 7.5* 7.8* 7.8*  HCT 23.4*   < > 18.2* 23.2* 23.4* 21.9* 23.5* 24.4*  MCV 87.6   < > 86.7  --  83.6 83.9 86.1 86.8  PLT  188   < > 120*  --  101* 110* 100* 82*   < > = values in this interval not displayed.     Cardiac Enzymes: Recent Labs  Lab 08/21/21 0957  CKTOTAL 75     BNP: Invalid input(s): POCBNP  CBG: Recent Labs  Lab 08/24/21 1938 08/25/21 0025 08/25/21 0322 08/25/21 0803 08/25/21 1118  GLUCAP 134* 147* 137* 140* 136*     Microbiology: Results for orders placed or performed during the hospital encounter of 08/21/21  Resp Panel by RT-PCR (Flu A&B, Covid) Nasopharyngeal Swab     Status: None   Collection Time: 08/21/21  1:00 AM   Specimen: Nasopharyngeal Swab; Nasopharyngeal(NP) swabs in vial transport medium  Result Value Ref Range Status   SARS Coronavirus 2 by RT PCR NEGATIVE NEGATIVE Final    Comment: (NOTE) SARS-CoV-2 target nucleic acids are NOT DETECTED.  The SARS-CoV-2 RNA is generally detectable in upper respiratory specimens during the acute phase of infection. The lowest concentration of SARS-CoV-2 viral copies this assay can detect is 138 copies/mL. A negative result does not preclude SARS-Cov-2 infection and should not be used as the sole basis for treatment or other patient management decisions. A negative result  may occur with  improper specimen collection/handling, submission of specimen other than nasopharyngeal swab, presence of viral mutation(s) within the areas targeted by this assay, and inadequate number of viral copies(<138 copies/mL). A negative result must be combined with clinical observations, patient history, and epidemiological information. The expected result is Negative.  Fact Sheet for Patients:  EntrepreneurPulse.com.au  Fact Sheet for Healthcare Providers:  IncredibleEmployment.be  This test is no t yet approved or cleared by the [place] FDA and  has been authorized for detection and/or diagnosis of SARS-CoV-2 by FDA under an Emergency Use Authorization (EUA). This EUA will remain  in effect (meaning this test can be used) for the duration of the COVID-19 declaration under Section 564(b)(1) of the Act, 21 U.S.C.section 360bbb-3(b)(1), unless the authorization is terminated  or revoked sooner.       Influenza A by PCR NEGATIVE NEGATIVE Final   Influenza B by PCR NEGATIVE NEGATIVE Final    Comment: (NOTE) The Xpert Xpress SARS-CoV-2/FLU/RSV plus assay is intended as an aid in the diagnosis of influenza from Nasopharyngeal swab specimens and should not be used as a sole basis for treatment. Nasal washings and aspirates are unacceptable for Xpert Xpress SARS-CoV-2/FLU/RSV testing.  Fact Sheet for Patients: EntrepreneurPulse.com.au  Fact Sheet for Healthcare Providers: IncredibleEmployment.be  This test is not yet approved or cleared by the [place] FDA and has been authorized for detection and/or diagnosis of SARS-CoV-2 by FDA under an Emergency Use Authorization (EUA). This EUA will remain in effect (meaning this test can be used) for the duration of the COVID-19 declaration under Section 564(b)(1) of the Act, 21 U.S.C. section 360bbb-3(b)(1), unless the authorization is terminated  or revoked.  Performed at [facility], [address] [place], [place] [address]   Blood Culture (routine x 2)     Status: None   Collection Time: 08/21/21  1:04 AM   Specimen: BLOOD  Result Value Ref Range Status   Specimen Description BLOOD RIGHT FOREARM  Final   Special Requests   Final    BOTTLES DRAWN AEROBIC AND ANAEROBIC Blood Culture results may not be optimal due to an inadequate volume of blood received in culture bottles   Culture   Final    NO GROWTH 5 DAYS Performed at  [facility], [address] [place], [place] [address]    Report Status 08/26/2021 FINAL  Final  Blood Culture (routine x 2)     Status: None   Collection Time: 08/21/21  1:05 AM   Specimen: BLOOD  Result Value Ref Range Status   Specimen Description BLOOD RIGHT HAND  Final   Special Requests   Final    BOTTLES DRAWN AEROBIC AND ANAEROBIC Blood Culture adequate volume   Culture   Final    NO GROWTH 5 DAYS Performed at [facility], [address], [place], [place] [address]    Report Status 08/26/2021 FINAL  Final  Urine Culture     Status: Abnormal   Collection Time: 08/21/21  2:05 AM   Specimen: In/Out Cath Urine  Result Value Ref Range Status   Specimen Description   Final    IN/OUT CATH URINE Performed at [facility], [address] [place], [place] [address]    Special Requests   Final    NONE Performed at [facility], [address] [place], [place] [address]    Culture MULTIPLE SPECIES PRESENT, SUGGEST RECOLLECTION (A)  Final   Report Status 08/22/2021 FINAL  Final  MRSA Next Gen by PCR, Nasal     Status: None   Collection Time: 08/21/21  4:50 AM   Specimen: Nasal Mucosa; Nasal Swab  Result Value Ref Range Status   MRSA by PCR Next Gen NOT DETECTED NOT DETECTED Final    Comment: (NOTE) The GeneXpert MRSA Assay (FDA approved for NASAL specimens only), is one component of a comprehensive MRSA colonization  surveillance program. It is not intended to diagnose MRSA infection nor to guide or monitor treatment for MRSA infections. Test performance is not FDA approved in patients less than 61 years old. Performed at [facility], [address] [place], [place] [address]   Respiratory (~20 pathogens) panel by PCR     Status: None   Collection Time: 08/21/21  7:06 AM   Specimen: Nasopharyngeal Swab; Respiratory  Result Value Ref Range Status   Adenovirus NOT DETECTED NOT DETECTED Final   Coronavirus 229E NOT DETECTED NOT DETECTED Final    Comment: (NOTE) The Coronavirus on the Respiratory Panel, DOES NOT test for the novel  Coronavirus (2019 nCoV)    Coronavirus HKU1 NOT DETECTED NOT DETECTED Final   Coronavirus NL63 NOT DETECTED NOT DETECTED Final   Coronavirus OC43 NOT DETECTED NOT DETECTED Final   Metapneumovirus NOT DETECTED NOT DETECTED Final   Rhinovirus / Enterovirus NOT DETECTED NOT DETECTED Final   Influenza A NOT DETECTED NOT DETECTED Final   Influenza B NOT DETECTED NOT DETECTED Final   Parainfluenza Virus 1 NOT DETECTED NOT DETECTED Final   Parainfluenza Virus 2 NOT DETECTED NOT DETECTED Final   Parainfluenza Virus 3 NOT DETECTED NOT DETECTED Final   Parainfluenza Virus 4 NOT DETECTED NOT DETECTED Final   Respiratory Syncytial Virus NOT DETECTED NOT DETECTED Final   Bordetella pertussis NOT DETECTED NOT DETECTED Final   Bordetella Parapertussis NOT DETECTED NOT DETECTED Final   Chlamydophila pneumoniae NOT DETECTED NOT DETECTED Final   Mycoplasma pneumoniae NOT DETECTED NOT DETECTED Final    Comment: Performed at [facility], [place]. [address] [place], [place] [address]    Coagulation Studies: Recent Labs    08/26/21 1029  LABPROT 13.2  INR 1.0     Urinalysis: No results for input(s): COLORURINE, LABSPEC, PHURINE, GLUCOSEU, HGBUR, BILIRUBINUR, KETONESUR, PROTEINUR, UROBILINOGEN, NITRITE, LEUKOCYTESUR  in the last 72 hours.  Invalid input(s):  APPERANCEUR     Imaging: No results found.   Medications:    sodium chloride     sodium chloride      carvedilol  25 mg Oral BID WC   Chlorhexidine Gluconate Cloth  6 each Topical Q0600   Chlorhexidine Gluconate Cloth  6 each Topical Q0600   cloNIDine  0.3 mg Transdermal Weekly   feeding supplement (NEPRO CARB STEADY)  237 mL Oral TID BM   hydroxychloroquine  400 mg Oral Daily   isosorbide-hydrALAZINE  1 tablet Oral TID   lacosamide  150 mg Oral BID   methylPREDNISolone (SOLU-MEDROL) injection  80 mg Intravenous Q24H   multivitamin  1 tablet Oral QHS   pantoprazole  40 mg Oral Daily   pravastatin  20 mg Oral Daily   sacubitril-valsartan  1 tablet Oral BID   spironolactone  12.5 mg Oral Daily   sodium chloride, sodium chloride, acetaminophen, alteplase, bismuth subsalicylate, docusate sodium, heparin, HYDROmorphone (DILAUDID) injection, labetalol, levalbuterol, lidocaine (PF), lidocaine-prilocaine, LORazepam, ondansetron (ZOFRAN) IV, ondansetron (ZOFRAN) IV, oxyCODONE, pentafluoroprop-tetrafluoroeth, polyethylene glycol  Assessment/ Plan:  20 y.o. 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     1.  Acute kidney injury/chronic kidney disease stage IV/class IV lupus nephritis/proteinuria.  Patient has been actively followed by [facility] nephrology.  In the past has been treated with Cytoxan.  Given her young age it was felt that further Cytoxan treatment should be avoided.  Acute kidney injury now could be secondary to progressive lupus nephritis. -Patient due for peritoneal dialysis catheter placement later today.  Thereafter we  will plan for hemodialysis treatment.  Case was discussed with [facility] nephrology yesterday.  We will hold off on renal biopsy at this time.  2.  Hyperkalemia.  Potassium currently 4.3 and acceptable.  3.  Anemia of chronic kidney disease/hemolytic anemia in the setting of lupus nephritis.  Hemoglobin 7.8 today.  Consider adding Epogen next week.  4.  Secondary hyperparathyroidism.  Continue to periodically monitor serum phosphorus.   LOS: 6 Melinda Steele 12/2/20229:47 AM

## 2021-08-27 NOTE — Progress Notes (Signed)
Patient has a bed at [facility].  Patient will be transferred to [place].  Transport does not have availability to transport until tomorrow.  [facility], [employer] and [facility] were all consulted.

## 2021-08-27 NOTE — Progress Notes (Signed)
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  When EMS arrived she was postictal.  When she became unresponsive sounds like she was gasping and choking.  Neurology was consulted on admission, she was loaded with Vimpat.  She also met criteria for sepsis and was hypoxic requiring heated high flow nasal cannula.  She was initially admitted to the ICU, improved and eventually transferred to the hospitalist service on 11/29  ED course: Per EDP patient appeared post ictal on arrival, now oriented & moving all extremities. Due to breakthrough seizure while on Vimpat, EDP discussed the case with Pharmacist and a loading dose was given IV. Patient was switched to Vimpat from [place] due to breakthrough seizures. Patient met criteria for Sepsis workup and protocol initiated with only 1 L of IVF given due to BNP > 4500. Patient was transitioned to [facility] because of hypoxia, now on room air.  Patient started on hemodialysis due to worsening renal function.  Most likely secondary to lupus. Going for PD catheter placement later today with general surgery. Patient needs to go to [facility] for further work-up and renal transplant, apparently on their list for transfer which might not have been so easily.  Most likely will be discharged home and she will follow-up with her [facility] providers as an outpatient so they can facilitate a potential transplant.  Subjective / 24h Interval events: Patient was seen and examined today.  Waiting for  her procedure for PD catheter placement.  Grandparents at bedside.  No new complaints.  Assessment & Plan: Principal Problem Acute hypoxic respiratory failure due to multifocal pneumonia, sepsis -initially requiring high flow nasal cannula, currently improved on room air.  She has been placed on IV antibiotics with ceftriaxone and metronidazole, improving, sepsis physiology resolved.  Completed the course of antibiotics.  Active Problems SLE-rheumatology consulted and evaluated patient 11/27. She is awaiting transfer to [facility] to be evaluated by her outpatient team.  Continue hydroxychloroquine as well -Rheumatology decrease the dose of steroid to daily dose, plan to taper to her home dose in next 2 to 3 weeks.  Seizure disorder-apparently patient missed a dose of Vimpat prior to the seizure.  She was on Keppra in the past but she was changed to Vimpat due to breakthrough seizures.  She is currently on 150 mg every 12, she has been seizure-free.  Neurology signed off on 11/27  Acute kidney injury on chronic kidney disease stage IIIb -nephrology consulted, has progressed to the point that she had to have a dialysis catheter placed on 11/28 and underwent HD same day.   Patient was seen with Dr. Zollie Scale today.  Had a long discussion regarding renal transplant and hemodialysis versus peritoneal dialysis.  Patient is leaning more towards peritoneal dialysis.  Also awaiting transfer to [facility].  Received 3 days of HD.  Patient would like to have peritoneal dialysis. -Going for PD catheter placement with general surgery later today. -Nephrology talked with her nephrologist at [facility] and they decided to hold off to renal biopsy at this time.  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  Cardiology consulted due to worsening EF 30-35% down from 55% in September 2022 and moderate to large pericardial effusion.  Appreciate input,  volume management with dialysis, no evidence of cardiac tamponade and per cardiology no role for NSAIDs.  For now she is to continue carvedilol, to consider resuming spironolactone and Entresto if there is no chance of renal recovery.  Empagliflozin is contraindicated in ESRD -Cardiology started her on BiDil -Continue with carvedilol  Hyperkalemia, hyperphosphatemia-due to renal failure, managed with HD  Non-anion gap metabolic acidosis-resolved.  Most likely due to renal failure  Anemia of chronic renal disease, history of hemolytic anemia-monitor hemoglobin, currently 7.8 and seems stable. -Continue to monitor -Transfuse if below 7  Thrombocytopenia-due to renal failure, monitor, currently at 144  Essential hypertension-continue carvedilol, clonidine, nifedipine  Hyperlipidemia-continue statin  Scheduled Meds:  [MAR Hold] carvedilol  25 mg Oral BID WC   chlorhexidine  15 mL Mouth/Throat Once   Or   mouth rinse  15 mL Mouth Rinse Once   [MAR Hold] Chlorhexidine Gluconate Cloth  6 each Topical Q0600   [MAR Hold] Chlorhexidine Gluconate Cloth  6 each Topical Q0600   [MAR Hold] cloNIDine  0.3 mg Transdermal Weekly   [MAR Hold] feeding supplement (NEPRO CARB STEADY)  237 mL Oral TID BM   [MAR Hold] hydroxychloroquine  400 mg Oral Daily   [MAR Hold] isosorbide-hydrALAZINE  1 tablet Oral TID   [MAR Hold] lacosamide  150 mg Oral BID   [MAR Hold] methylPREDNISolone (SOLU-MEDROL) injection  80 mg Intravenous Q24H   [MAR Hold] multivitamin  1 tablet Oral QHS   [MAR Hold] pantoprazole  40 mg Oral Daily   [MAR Hold] pravastatin  20 mg Oral Daily   [MAR Hold] sacubitril-valsartan  1 tablet Oral BID   [MAR Hold] spironolactone  12.5 mg Oral Daily   Continuous Infusions:  [MAR Hold] sodium chloride     [MAR Hold] sodium chloride     sodium chloride 10 mL/hr at 08/27/21 1202   PRN Meds:.[MAR Hold] sodium chloride, [MAR Hold] sodium chloride, [MAR Hold] acetaminophen, [MAR Hold] alteplase,  [MAR Hold] bismuth subsalicylate, syringe builder - Optime, [MAR Hold] docusate sodium, [MAR Hold] heparin, [MAR Hold]  HYDROmorphone (DILAUDID) injection, [MAR Hold] labetalol, [MAR Hold] levalbuterol, [MAR Hold] lidocaine (PF), [MAR Hold] lidocaine-prilocaine, [MAR Hold] LORazepam, [MAR Hold] ondansetron (ZOFRAN) IV, [MAR Hold] ondansetron (ZOFRAN) IV, [MAR Hold] oxyCODONE, [MAR Hold] pentafluoroprop-tetrafluoroeth, [MAR Hold] polyethylene glycol  Diet Orders (From admission, onward)     Start     Ordered   08/27/21 0001  Diet NPO time specified  Diet effective midnight        08/26/21 1014            DVT prophylaxis: SCDs Start: 08/21/21 0159    Code Status: Full Code  Family Communication: Discussed with grandparents at bedside.  Status is: Inpatient  Remains inpatient appropriate because: Severity of illness  Level of care: Med-Surg  Consultants:  Nephrology  Cardiology  PCCM Neurology  Vascular surgery Rheumatology  Procedures:  R IJ placement by vascular 11/28 08/21/2021: Chest x-ray>>IMPRESSION: 1. Extensive patchy airspace disease throughout the right upper and lower lobes, concerning for multifocal pneumonia. 2. Underlying cardiomegaly with perihilar vascular congestion without overt pulmonary edema. 3. Probable trace bilateral pleural effusions. 08/21/2021: CT head without contrast>>No acute intracranial pathology. 08/21/2021: CT cervical spine>>No acute fracture or subluxation of the cervical spine 08/22/2021: CT abdomen and pelvis without contrast>>IMPRESSION: 1. Pulmonary findings  suggestive of infection/inflammation versus edema versus alveolar hemorrhage. 2. Partially visualized at least small volume pericardial effusion. 3. Trace right pleural effusion. 4. Likely gallbladder wall thickening with markedly limited evaluation on this noncontrast study. Recommend right upper quadrant ultrasound for further evaluation.  5. Trace simple free fluid within the  abdomen and pelvis 08/23/2021: Right upper quadrant abdominal ultrasound>>IMPRESSION: 1. Thin, echogenic area along the wall of the gallbladder fundus which may represent very mild gallbladder wall calcification. 2. No evidence of acute cholecystitis. Further evaluation with a nuclear medicine hepatobiliary scan is recommended if this is of clinical concern. 3. Small right pleural effusion. 4. Echogenic right kidney which may be secondary to medical renal disease related to the patient's history of chronic renal disease. 08/22/21: Echocardiogram>>  1. Moderate pericardial effusion. The pericardial effusion is posterior  and lateral to the left ventricle. There is no evidence of cardiac  tamponade.   2. Left ventricular ejection fraction, by estimation, is 30 to 35%. The  left ventricle has moderately decreased function. The left ventricle  demonstrates global hypokinesis. There is mild left ventricular  hypertrophy. Left ventricular diastolic  parameters are consistent with Grade II diastolic dysfunction  (pseudonormalization). The average left ventricular global longitudinal  strain is -5.1 %. The global longitudinal strain is abnormal.   3. Right ventricular systolic function is normal. The right ventricular  size is normal. There is normal pulmonary artery systolic pressure. The  estimated right ventricular systolic pressure is 94.7 mmHg.   4. Left atrial size was moderately dilated.   5. Right atrial size was mildly dilated.   6. The mitral valve is normal in structure. Mild mitral valve regurgitation. No evidence of mitral stenosis.   7. Tricuspid valve regurgitation is moderate.   8. The inferior vena cava is normal in size with greater than 50% respiratory variability, suggesting right atrial pressure of 3 mmHg.   Microbiology  Blood cultures 11/26-no growth to date Urine culture 11/26-multiple species, suggest recollection SARS COVID 2, flu A, flu B, RVP-negative MRSA  PCR-negative  Antimicrobials: Azithromycin 11/26 >>12/1 Ceftriaxone 11/26 >>12/1   Objective: Vitals:   08/26/21 1300 08/26/21 2000 08/27/21 0830 08/27/21 1150  BP: (!) 137/98 (!) 130/93 (!) 150/108 (!) 154/110  Pulse: 95 88 82 83  Resp: 18 18 18 16   Temp:  98.6 F (37 C) 98.7 F (37.1 C) 98.6 F (37 C)  TempSrc:  Oral Oral Temporal  SpO2: 96% 99% 99% 98%  Weight:      Height:        Intake/Output Summary (Last 24 hours) at 08/27/2021 1354 Last data filed at 08/26/2021 1600 Gross per 24 hour  Intake 120 ml  Output --  Net 120 ml    Filed Weights   08/24/21 1230 08/25/21 1426 08/25/21 1831  Weight: 65.9 kg 68.9 kg 67.8 kg    Examination:  [place] lady, in no acute distress. Pulmonary.  Lungs clear bilaterally, normal respiratory effort. CV.  Regular rate and rhythm, no JVD, rub or murmur. Abdomen.  Soft, nontender, nondistended, BS positive. CNS.  Alert and oriented x3.  No focal neurologic deficit. Extremities.  No edema, no cyanosis, pulses intact and symmetrical. Psychiatry.  Judgment and insight appears normal.   Data Reviewed: I have independently reviewed following labs and imaging studies  CBC: Recent Labs  Lab 08/21/21 0100 08/21/21 0531 08/22/21 0409 08/22/21 1626 08/23/21 0548 08/24/21 0416 08/25/21 0414 08/26/21 0406  WBC 20.1*   < > 32.8*  --  21.0*  9.3 10.7* 7.6  NEUTROABS 18.5*  --   --   --   --  8.6* 9.8* 6.8  HGB 7.4*   < > 5.9* 7.8* 8.1* 7.5* 7.8* 7.8*  HCT 23.4*   < > 18.2* 23.2* 23.4* 21.9* 23.5* 24.4*  MCV 87.6   < > 86.7  --  83.6 83.9 86.1 86.8  PLT 188   < > 120*  --  101* 110* 100* 82*   < > = values in this interval not displayed.    Basic Metabolic Panel: Recent Labs  Lab 08/21/21 0531 08/22/21 0409 08/22/21 2008 08/23/21 0548 08/23/21 1110 08/24/21 0416 08/25/21 0414 08/26/21 0406  NA 139 137  --  135  --  134* 134*  133* 135  K 5.1 6.1*   < > 5.7* 5.6* 4.9 4.5  4.5 4.3  CL 112* 106  --  106  --  101  100  99 100  CO2 19* 20*  --  18*  --  21* 26  25 25   GLUCOSE 157* 133*  --  142*  --  179* 163*  166* 144*  BUN 88* 97*  --  89*  --  89* 76*  76* 43*  CREATININE 7.90* 8.78*  --  9.11*  --  8.17* 6.18*  6.09* 3.71*  CALCIUM 7.3* 7.7*  --  8.2*  --  7.8* 7.6*  7.5* 7.8*  MG 1.7 2.9*  --  2.7*  --  2.3  --   --   PHOS 4.4 5.8*  --  6.7* 7.2* 7.4*  7.4* 5.1* 4.7*   < > = values in this interval not displayed.    Liver Function Tests: Recent Labs  Lab 08/21/21 0100 08/23/21 0548 08/24/21 0416 08/25/21 0414 08/26/21 0406  AST 20 22  --  21  --   ALT 15 17  --  24  --   ALKPHOS 43 48  --  69  --   BILITOT 0.6 0.9  --  1.0  --   PROT 6.2* 6.1*  --  5.5*  --   ALBUMIN 3.2* 2.7* 2.8* 2.8*  2.7* 2.8*    Coagulation Profile: Recent Labs  Lab 08/21/21 0101 08/26/21 1029  INR 1.0 1.0    HbA1C: No results for input(s): HGBA1C in the last 72 hours. CBG: Recent Labs  Lab 08/24/21 1938 08/25/21 0025 08/25/21 0322 08/25/21 0803 08/25/21 1118  GLUCAP 134* 147* 137* 140* 136*     Recent Results (from the past 240 hour(s))  Resp Panel by RT-PCR (Flu A&B, Covid) Nasopharyngeal Swab     Status: None   Collection Time: 08/21/21  1:00 AM   Specimen: Nasopharyngeal Swab; Nasopharyngeal(NP) swabs in vial transport medium  Result Value Ref Range Status   SARS Coronavirus 2 by RT PCR NEGATIVE NEGATIVE Final    Comment: (NOTE) SARS-CoV-2 target nucleic acids are NOT DETECTED.  The SARS-CoV-2 RNA is generally detectable in upper respiratory specimens during the acute phase of infection. The lowest concentration of SARS-CoV-2 viral copies this assay can detect is 138 copies/mL. A negative result does not preclude SARS-Cov-2 infection and should not be used as the sole basis for treatment or other patient management decisions. A negative result may occur with  improper specimen collection/handling, submission of specimen other than nasopharyngeal swab, presence of viral  mutation(s) within the areas targeted by this assay, and inadequate number of viral copies(<138 copies/mL). A negative result must be combined with clinical observations, patient history, and  epidemiological information. The expected result is Negative.  Fact Sheet for Patients:  EntrepreneurPulse.com.au  Fact Sheet for Healthcare Providers:  IncredibleEmployment.be  This test is no t yet approved or cleared by the [place] FDA and  has been authorized for detection and/or diagnosis of SARS-CoV-2 by FDA under an Emergency Use Authorization (EUA). This EUA will remain  in effect (meaning this test can be used) for the duration of the COVID-19 declaration under Section 564(b)(1) of the Act, 21 U.S.C.section 360bbb-3(b)(1), unless the authorization is terminated  or revoked sooner.       Influenza A by PCR NEGATIVE NEGATIVE Final   Influenza B by PCR NEGATIVE NEGATIVE Final    Comment: (NOTE) The Xpert Xpress SARS-CoV-2/FLU/RSV plus assay is intended as an aid in the diagnosis of influenza from Nasopharyngeal swab specimens and should not be used as a sole basis for treatment. Nasal washings and aspirates are unacceptable for Xpert Xpress SARS-CoV-2/FLU/RSV testing.  Fact Sheet for Patients: EntrepreneurPulse.com.au  Fact Sheet for Healthcare Providers: IncredibleEmployment.be  This test is not yet approved or cleared by the [place] FDA and has been authorized for detection and/or diagnosis of SARS-CoV-2 by FDA under an Emergency Use Authorization (EUA). This EUA will remain in effect (meaning this test can be used) for the duration of the COVID-19 declaration under Section 564(b)(1) of the Act, 21 U.S.C. section 360bbb-3(b)(1), unless the authorization is terminated or revoked.  Performed at [facility], [address] [place], [place] [address]   Blood Culture (routine x 2)      Status: None   Collection Time: 08/21/21  1:04 AM   Specimen: BLOOD  Result Value Ref Range Status   Specimen Description BLOOD RIGHT FOREARM  Final   Special Requests   Final    BOTTLES DRAWN AEROBIC AND ANAEROBIC Blood Culture results may not be optimal due to an inadequate volume of blood received in culture bottles   Culture   Final    NO GROWTH 5 DAYS Performed at [facility] [address], [place], [place] [address]    Report Status 08/26/2021 FINAL  Final  Blood Culture (routine x 2)     Status: None   Collection Time: 08/21/21  1:05 AM   Specimen: BLOOD  Result Value Ref Range Status   Specimen Description BLOOD RIGHT HAND  Final   Special Requests   Final    BOTTLES DRAWN AEROBIC AND ANAEROBIC Blood Culture adequate volume   Culture   Final    NO GROWTH 5 DAYS Performed at [facility], [address] [place], [place] [address]    Report Status 08/26/2021 FINAL  Final  Urine Culture     Status: Abnormal   Collection Time: 08/21/21  2:05 AM   Specimen: In/Out Cath Urine  Result Value Ref Range Status   Specimen Description   Final    IN/OUT CATH URINE Performed at [facility], [address] [place], [place] [address]    Special Requests   Final    NONE Performed at [facility], [address] [place], [place] [address]    Culture MULTIPLE SPECIES PRESENT, SUGGEST RECOLLECTION (A)  Final   Report Status 08/22/2021 FINAL  Final  MRSA Next Gen by PCR, Nasal     Status: None   Collection Time: 08/21/21  4:50 AM   Specimen: Nasal Mucosa; Nasal Swab  Result Value Ref Range Status   MRSA by PCR Next Gen NOT DETECTED NOT DETECTED  Final    Comment: (NOTE) The GeneXpert MRSA Assay (FDA approved for NASAL specimens only), is one component of a comprehensive MRSA colonization surveillance program. It is not intended to diagnose MRSA infection nor to guide or monitor treatment for MRSA infections. Test performance is not FDA  approved in patients less than 79 years old. Performed at [facility], [address] [place], [place] [address]   Respiratory (~20 pathogens) panel by PCR     Status: None   Collection Time: 08/21/21  7:06 AM   Specimen: Nasopharyngeal Swab; Respiratory  Result Value Ref Range Status   Adenovirus NOT DETECTED NOT DETECTED Final   Coronavirus 229E NOT DETECTED NOT DETECTED Final    Comment: (NOTE) The Coronavirus on the Respiratory Panel, DOES NOT test for the novel  Coronavirus (2019 nCoV)    Coronavirus HKU1 NOT DETECTED NOT DETECTED Final   Coronavirus NL63 NOT DETECTED NOT DETECTED Final   Coronavirus OC43 NOT DETECTED NOT DETECTED Final   Metapneumovirus NOT DETECTED NOT DETECTED Final   Rhinovirus / Enterovirus NOT DETECTED NOT DETECTED Final   Influenza A NOT DETECTED NOT DETECTED Final   Influenza B NOT DETECTED NOT DETECTED Final   Parainfluenza Virus 1 NOT DETECTED NOT DETECTED Final   Parainfluenza Virus 2 NOT DETECTED NOT DETECTED Final   Parainfluenza Virus 3 NOT DETECTED NOT DETECTED Final   Parainfluenza Virus 4 NOT DETECTED NOT DETECTED Final   Respiratory Syncytial Virus NOT DETECTED NOT DETECTED Final   Bordetella pertussis NOT DETECTED NOT DETECTED Final   Bordetella Parapertussis NOT DETECTED NOT DETECTED Final   Chlamydophila pneumoniae NOT DETECTED NOT DETECTED Final   Mycoplasma pneumoniae NOT DETECTED NOT DETECTED Final    Comment: Performed at [facility], [place]. [address] [place],  [address]      Radiology Studies: No results found.  Lorella Nimrod MD [facility]  Between 7 am - 7 pm I am available, please contact me via Amion (for emergencies) or Securechat (non urgent messages)  Between 7 pm - 7 am I am not available, please contact night coverage MD/APP via Amion

## 2021-08-27 NOTE — Op Note (Signed)
Laparoscopic placement of peritoneal Dialysis catheter ( Total three cuffs) Laparoscopic Omentopexy   Pre-operative Diagnosis: ESRD   Post-operative Diagnosis: same     Surgeon: Caroleen Hamman, MD FACS   Anesthesia: Gen. with endotracheal tube      Findings: Catheter within pelvis Good return after infusing Peritoneal cavity   Estimated Blood Loss: 5cc              Complications: none     Procedure Details  The patient was seen again in the Holding Room. The benefits, complications, treatment options, and expected outcomes were discussed with the patient. The risks of bleeding, infection, recurrence of symptoms, failure to resolve symptoms, catheter malfunction bowel injury, any of which could require further surgery were reviewed with the patient. The likelihood of improving the patient's symptoms with return to their baseline status is good.  The patient and/or family concurred with the proposed plan, giving informed consent.  The patient was taken to Operating Room, identified and the procedure verified. A Time Out was held and the above information confirmed.   Prior to the induction of general anesthesia, antibiotic prophylaxis was administered. VTE prophylaxis was in place. General endotracheal anesthesia was then administered and tolerated well. After the induction, the abdomen was prepped with Chloraprep and draped in the sterile fashion. The patient was positioned in the supine position.   Periumbilical incision created to the left of the midline.   The anterior rectus fascia identified and incised, rectus muscle identified and retracted laterally, using a port We were able to tunnel into the retro rectus space for about 6 cms. Peritoneum was pierced off the midline. Pneumoperitoneum was obtained w/o hemodynamic changes. We placed two additional laparoscopic ports under direct visualization.   We were able to thread the catheter via the laparoscopic port within the retrorectus  space in the standard fashion.  Under direct visualization we made sure that the coil portion of the catheter layed within the pelvis without any kinks.  I was able to also perform a counterincision in the left subcostal area and Lovena Le an additional extension of the catheter . The  Extension had 2 cuffs and I was able to connect it to parse together with the titanium connector in the standard fashion.  There was no evidence of any kinks.  The exit site was to the left upper quadrant. We instilled heparinized saline a liter into the pelvis and we had very good return. He did have generous omentum, attention was then turned to the omentum and using a PMI device we were able to perform an omentopexy and tacked the omentum to the abdominal wall using 2 interrupted 2-0 Vicryl sutures in the standard fashion. All skin incisions  were infiltrated with a liposomal Marcaine. 4-0 subcuticular Monocryl was used to close the skin. Dermabond was  applied. Sterile dressing applied to the catheter. The patient was then extubated and brought to the recovery room in stable condition. Sponge, lap, and needle counts were correct at closure and at the conclusion of the case.               Caroleen Hamman, MD, FACS

## 2021-08-27 NOTE — Transfer of Care (Signed)
Immediate Anesthesia Transfer of Care Note  Patient: Melinda Steele  Procedure(s) Performed: LAPAROSCOPIC INSERTION CONTINUOUS AMBULATORY PERITONEAL DIALYSIS  (CAPD) CATHETER  Patient Location: PACU  Anesthesia Type:General  Level of Consciousness: awake, alert  and oriented  Airway & Oxygen Therapy: Patient Spontanous Breathing  Post-op Assessment: Report given to RN and Post -op Vital signs reviewed and stable  Post vital signs: Reviewed and stable  Last Vitals:  Vitals Value Taken Time  BP 170/121 08/27/21 1419  Temp 37 C 08/27/21 1419  Pulse 96 08/27/21 1419  Resp 16 08/27/21 1419  SpO2 99 % 08/27/21 1419    Last Pain:  Vitals:   08/27/21 1150  TempSrc: Temporal  PainSc: 0-No pain      Patients Stated Pain Goal: 10 (44/[id number]/95 [id number])  Complications: No notable events documented.

## 2021-08-27 NOTE — Progress Notes (Signed)
Patient is hypertensive in PACU, patient is stable and heart rate is regular and in NSR at 80 BPM, pain is a level 3/ 10 at the surgical site at this time. Dr. Bertell Maria ordered labetolol IV 10 mg at 1441 and was given to the patient by this nurse. Patient is still hypertensive in the 170's SBP. Dr. Bertell Maria did not reorder any other medications and says patient is okay to return to the floor. Will continue to monitor.

## 2021-08-27 NOTE — Progress Notes (Signed)
Progress Note  Patient Name: Melinda Steele Date of Encounter: 08/27/2021  Primary Cardiologist: [facility]  Subjective   Sleepy this AM.  No chest pain Melinda Steele.  Parents @ bedside.  Pending lap PD catheter placement this AM.  Inpatient Medications    Scheduled Meds:  carvedilol  25 mg Oral BID WC   Chlorhexidine Gluconate Cloth  6 each Topical Q0600   Chlorhexidine Gluconate Cloth  6 each Topical Q0600   chlorthalidone  50 mg Oral Daily   cloNIDine  0.3 mg Transdermal Weekly   feeding supplement (NEPRO CARB STEADY)  237 mL Oral TID BM   hydroxychloroquine  400 mg Oral Daily   isosorbide-hydrALAZINE  1 tablet Oral TID   lacosamide  150 mg Oral BID   methylPREDNISolone (SOLU-MEDROL) injection  80 mg Intravenous Q24H   multivitamin  1 tablet Oral QHS   pantoprazole  40 mg Oral Daily   pravastatin  20 mg Oral Daily   spironolactone  12.5 mg Oral Daily   Continuous Infusions:  sodium chloride     sodium chloride     PRN Meds: sodium chloride, sodium chloride, acetaminophen, alteplase, bismuth subsalicylate, docusate sodium, heparin, HYDROmorphone (DILAUDID) injection, labetalol, levalbuterol, lidocaine (PF), lidocaine-prilocaine, LORazepam, ondansetron (ZOFRAN) IV, ondansetron (ZOFRAN) IV, oxyCODONE, pentafluoroprop-tetrafluoroeth, polyethylene glycol   Vital Signs    Vitals:   08/26/21 0800 08/26/21 1300 08/26/21 2000 08/27/21 0830  BP: (!) 144/108 (!) 137/98 (!) 130/93 (!) 150/108  Pulse: 95 95 88 82  Resp: 11 18 18 18   Temp: 98.7 F (37.1 C)  98.6 F (37 C) 98.7 F (37.1 C)  TempSrc: Oral  Oral Oral  SpO2: 94% 96% 99% 99%  Weight:      Height:        Intake/Output Summary (Last 24 hours) at 08/27/2021 0903 Last data filed at 08/26/2021 1600 Gross per 24 hour  Intake 480 ml  Output --  Net 480 ml   Filed Weights   08/24/21 1230 08/25/21 1426 08/25/21 1831  Weight: 65.9 kg 68.9 kg 67.8 kg    Physical Exam   GEN: Well nourished, well developed, in no acute  distress.  HEENT: Grossly normal.  Neck: Supple, no JVD, carotid bruits, or masses. Cardiac: RRR, +S4, no murmurs, rubs, or gallops. No clubbing, cyanosis, edema.  Radials 2+, DP/PT 2+ and equal bilaterally.  Respiratory:  Respirations regular and unlabored, clear to auscultation bilaterally. GI: Soft, nontender, nondistended, BS + x 4. MS: no deformity or atrophy. Skin: warm and dry, no rash. Neuro:  Strength and sensation are intact. Psych: AAOx3.  Normal affect.  Labs    Chemistry Recent Labs  Lab 08/21/21 0100 08/21/21 0531 08/23/21 0548 08/23/21 1110 08/24/21 0416 08/25/21 0414 08/26/21 0406  NA 137   < > 135  --  134* 134*  133* 135  K 5.1   < > 5.7*   < > 4.9 4.5  4.5 4.3  CL 110   < > 106  --  101 100  99 100  CO2 17*   < > 18*  --  21* 26  25 25   GLUCOSE 80   < > 142*  --  179* 163*  166* 144*  BUN 86*   < > 89*  --  89* 76*  76* 43*  CREATININE 7.89*   < > 9.11*  --  8.17* 6.18*  6.09* 3.71*  CALCIUM 7.7*   < > 8.2*  --  7.8* 7.6*  7.5* 7.8*  PROT 6.2*  --  6.1*  --   --  5.5*  --   ALBUMIN 3.2*  --  2.7*  --  2.8* 2.8*  2.7* 2.8*  AST 20  --  22  --   --  21  --   ALT 15  --  17  --   --  24  --   ALKPHOS 43  --  48  --   --  69  --   BILITOT 0.6  --  0.9  --   --  1.0  --   GFRNONAA 7*   < > 6*  --  7* 9*  9* 17*  ANIONGAP 10   < > 11  --  12 8  9 10    < > = values in this interval not displayed.     Hematology Recent Labs  Lab 08/24/21 0416 08/25/21 0414 08/26/21 0406  WBC 9.3 10.7* 7.6  RBC 2.61* 2.73* 2.81*  HGB 7.5* 7.8* 7.8*  HCT 21.9* 23.5* 24.4*  MCV 83.9 86.1 86.8  MCH 28.7 28.6 27.8  MCHC 34.2 33.2 32.0  RDW 13.9 13.2 12.8  PLT 110* 100* 82*    Cardiac Enzymes  Recent Labs  Lab 08/21/21 0101 08/21/21 0531 08/21/21 0957  TROPONINIHS 538* 619* 645*      BNP Recent Labs  Lab 08/21/21 0101 08/21/21 0531  BNP >4,500.0* >4,500.0*    Lipids  Lab Results  Component Value Date   CHOL 129 08/21/2021   HDL 42  08/21/2021   LDLCALC 67 08/21/2021   TRIG 102 08/21/2021   CHOLHDL 3.1 08/21/2021    Radiology    ------------------  Telemetry    rsr - Personally Reviewed  Cardiac Studies   TTE (08/22/2021):  1. Moderate pericardial effusion. The pericardial effusion is posterior  and lateral to the left ventricle. There is no evidence of cardiac  tamponade.   2. Left ventricular ejection fraction, by estimation, is 30 to 35%. The  left ventricle has moderately decreased function. The left ventricle  demonstrates global hypokinesis. There is mild left ventricular  hypertrophy. Left ventricular diastolic  parameters are consistent with Grade II diastolic dysfunction  (pseudonormalization). The average left ventricular global longitudinal  strain is -5.1 %. The global longitudinal strain is abnormal.   3. Right ventricular systolic function is normal. The right ventricular  size is normal. There is normal pulmonary artery systolic pressure. The  estimated right ventricular systolic pressure is 09.9 mmHg.   4. Left atrial size was moderately dilated.   5. Right atrial size was mildly dilated.   6. The mitral valve is normal in structure. Mild mitral valve  regurgitation. No evidence of mitral stenosis.   7. Tricuspid valve regurgitation is moderate.   8. The inferior vena cava is normal in size with greater than 50%  respiratory variability, suggesting right atrial pressure of 3 mmHg.     Patient Profile     20 y.o. female with a hx of seizures, lupus, chronic systolic heart failure, glomerulonephritis, CKD 3B, hemolytic anemia and chronic pericardial effusions who was admitted 11/26 2/2 resp failure and asp pna that developed after a seizure.  She was found to be anemic (Hgb 5.9) and req PRBCs.  Echo showed new LV dysfxn w/ EF of 30-35% and moderate pericardial eff w/o tamponade.  Assessment & Plan    1.  Recurrent moderate pericardial effusion:   Pt w/ a h/o SLE and recurrent  pericardial effusions followed @ [facility] and s/p prior pericardiocenteses.  Admitted 11/26 w/ resp failure following seizure.  Echo w/ mod pericardial effusion w/o tamponade. She remains hemodynamically stable and on the hypertensive side.  No role for pericardiocentesis at this time.  Plan limited echo pror to discharge.  2.  Acute resp failure/Aspiration PNA:  resp status stable.  Abx/steroids per IM.  3.  Acute HFrEF/Cardiomyopathy:   EF 30-35% by echo this admission.  Prev nl.  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  Remains euvolemic on exam. Adjusting meds to be more in line w/ GDMT for CHF/cardiomyopathy.  Bidil added yesterday  TID.  Follow pressures today - may need to separate out components of bidil to allow for titration of hydralazine.  Cont ? blocker and spiro. Discussed meds and dialysis status w/ nephrology.  Chlorthalidone has been d/c'd.  We will add low dose entresto today.  Follow lytes closely.  W/ new drop in EF, will need outpt ischemic eval - follows @ [facility].  4.  Essential HTN:  BP's mod elevated.  As above, adjusting meds to improve GDMT for CHF.  Entresto added today.  5.  Normocytic anemia:  s/p PRBCs.  Stable yesterday.  6.  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  For PD catheter placement today.  Per nephrology.  7.  SLE:  Rheum following.  Steroids/hydroxchloroquine.  Signed, Murray Hodgkins, NP  08/27/2021, 9:03 AM    For questions or updates, please contact   Please consult [email or web address] for contact info under Cardiology/STEMI.

## 2021-08-27 NOTE — Anesthesia Preprocedure Evaluation (Addendum)
Anesthesia Evaluation  Patient identified by MRN, date of birth, ID band Patient awake    Reviewed: Allergy & Precautions, NPO status , Patient's Chart, lab work & pertinent test results  History of Anesthesia Complications Negative for: history of anesthetic complications  Airway Mallampati: I  TM Distance: >3 FB Neck ROM: Full    Dental no notable dental hx. (+) Teeth Intact   Pulmonary neg sleep apnea, pneumonia, resolved, neg COPD, Patient abstained from smoking.Not current smoker,  Admitted with acute hypoxic respiratory failure 2/2 multifocal PNA and sepsis now resolved; currently on RA   Pulmonary exam normal breath sounds clear to auscultation       Cardiovascular Exercise Tolerance: Good METShypertension, +CHF (acute on chronic; EF 30-35%)  (-) CAD and (-) Past MI (-) dysrhythmias  Rhythm:Regular Rate:Normal - Systolic murmurs Pericardial effusion  ECG 08/21/21:  Sinus tachycardia RSR' in V1 or V2, right VCD or RVH LVH by voltage  Echo 08/22/21:  1. Moderate pericardial effusion. The pericardial effusion is posterior and lateral to the left ventricle. There is no evidence of cardiac tamponade.  2. Left ventricular ejection fraction, by estimation, is 30 to 35%. The left ventricle has moderately decreased function. The left ventricle demonstrates global hypokinesis. There is mild left ventricular hypertrophy. Left ventricular diastolic parameters are consistent with Grade II diastolic dysfunction (pseudonormalization). The average left ventricular global longitudinal strain is -5.1 %. The global longitudinal strain is abnormal.  3. Right ventricular systolic function is normal. The right ventricular size is normal. There is normal pulmonary artery systolic pressure. The estimated right ventricular systolic pressure is 96.2 mmHg.  4. Left atrial size was moderately dilated.  5. Right atrial size was mildly  dilated.  6. The mitral valve is normal in structure. Mild mitral valve regurgitation. No evidence of mitral stenosis.  7. Tricuspid valve regurgitation is moderate.  8. The inferior vena cava is normal in size with greater than 50% respiratory variability, suggesting right atrial pressure of 3 mmHg.   Neuro/Psych Seizures -,  negative psych ROS   GI/Hepatic neg GERD  ,(+)     (-) substance abuse  ,   Endo/Other  neg diabetes  Renal/GU ESRFRenal disease (lupus nephritis; AKI on stage IIIb CKD awaiting kidney transplant; on HD x3 days as inpatient)     Musculoskeletal   Abdominal   Peds  Hematology  (+) Blood dyscrasia (of chronic disease), anemia ,   Anesthesia Other Findings Past Medical History: No date: Hypertension No date: Lupus ([place]) No date: Lupus nephritis ([place]) No date: Seizure ([place])   Reproductive/Obstetrics                            Anesthesia Physical Anesthesia Plan  ASA: 4  Anesthesia Plan: General   Post-op Pain Management: Ofirmev IV (intra-op) and Dilaudid IV   Induction: Intravenous  PONV Risk Score and Plan: 3 and Ondansetron, Dexamethasone, Treatment may vary due to age or medical condition and Midazolam  Airway Management Planned: Oral ETT  Additional Equipment: None  Intra-op Plan:   Post-operative Plan: Extubation in OR  Informed Consent: I have reviewed the patients History and Physical, chart, labs and discussed the procedure including the risks, benefits and alternatives for the proposed anesthesia with the patient or authorized representative who has indicated his/her understanding and acceptance.     Dental advisory given  Plan Discussed with: CRNA  Anesthesia Plan Comments: (Patient consented for risks of anesthesia including but not  limited to:  - adverse reactions to medications - damage to eyes, teeth, lips or other oral mucosa - nerve damage due to positioning  - sore throat or  hoarseness - damage to heart, brain, nerves, lungs, other parts of body or loss of life  Told about increased risk of above due to CHF, ESRD.  Informed patient about role of CRNA in peri- and intra-operative care.  Patient voiced understanding.)      Anesthesia Quick Evaluation

## 2021-08-28 DIAGNOSIS — J189 Pneumonia, unspecified organism: Secondary | ICD-10-CM

## 2021-08-28 DIAGNOSIS — E785 Hyperlipidemia, unspecified: Secondary | ICD-10-CM

## 2021-08-28 DIAGNOSIS — J188 Other pneumonia, unspecified organism: Secondary | ICD-10-CM

## 2021-08-28 MED ORDER — NEPRO/CARBSTEADY PO LIQD: 237.0000 mL | Freq: Three times a day (TID) | ORAL | 0 refills | Status: DC

## 2021-08-28 MED ORDER — ISOSORB DINITRATE-HYDRALAZINE 20-37.5 MG PO TABS: 1.0000 | ORAL_TABLET | Freq: Three times a day (TID) | ORAL | Status: DC

## 2021-08-28 MED ORDER — PANTOPRAZOLE SODIUM 40 MG PO TBEC: 40.0000 mg | DELAYED_RELEASE_TABLET | Freq: Every day | ORAL | Status: AC

## 2021-08-28 MED ORDER — METHYLPREDNISOLONE SODIUM SUCC 125 MG IJ SOLR: 80.0000 mg | INTRAMUSCULAR | 0 refills | Status: DC

## 2021-08-28 MED ORDER — SACUBITRIL-VALSARTAN 24-26 MG PO TABS: 1.0000 | ORAL_TABLET | Freq: Two times a day (BID) | ORAL | Status: DC

## 2021-08-28 MED ORDER — RENA-VITE PO TABS: 1.0000 | ORAL_TABLET | Freq: Every day | ORAL | 0 refills | Status: AC

## 2021-08-28 MED ORDER — HYDRALAZINE HCL 20 MG/ML IJ SOLN
10.0000 mg | Freq: Four times a day (QID) | INTRAMUSCULAR | Status: DC | PRN
  Administered 2021-08-28: 10 mg via INTRAVENOUS
  Filled 2021-08-28: qty 1

## 2021-08-28 MED ORDER — POLYETHYLENE GLYCOL 3350 17 G PO PACK: 17.0000 g | PACK | Freq: Every day | ORAL | 0 refills | Status: AC | PRN

## 2021-08-28 NOTE — Discharge Summary (Signed)
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  When EMS arrived she was postictal.  When she became unresponsive sounds like she was gasping and choking.  Neurology was consulted on admission, she was loaded with Vimpat.  She also met criteria for sepsis and was hypoxic requiring heated high flow nasal cannula.  She was initially admitted to the ICU, improved and eventually transferred to the hospitalist service on 11/29   ED course: Per EDP patient appeared post ictal on arrival, now oriented & moving all extremities. Due to breakthrough seizure while on Vimpat, EDP discussed the case with Pharmacist and a loading dose was given IV. Patient was switched to Vimpat from [place] due to breakthrough seizures. Patient met criteria for Sepsis workup and protocol initiated with only 1 L of IVF given due to BNP > 4500. Patient was transitioned to [facility] because of hypoxia, now on room air.   Patient started on hemodialysis due to worsening renal function.  Most likely secondary to lupus. After having a discussion with patient patient would like to do peritoneal dialysis, PD catheter  was placed on 08/27/2021 and should be ready to use within a week.  It can be used earlier with low volume as advised by nephrology if needed.  Because of the complexity of her disease and young age patient needs tertiary level of care with a possible transplant.  Patient is being transferred to [facility] as all of her prior providers are from there and her need of tertiary care center.  [facility] will take over her care.  Acute hypoxic respiratory failure due to multifocal pneumonia, sepsis -initially requiring high flow nasal cannula, currently improved on room air.  She has been placed on IV antibiotics with ceftriaxone and metronidazole, improving, sepsis physiology resolved.  Completed the course of antibiotics.   Active Problems SLE-rheumatology consulted and evaluated patient 11/27. She is awaiting transfer to [facility] to be evaluated by her outpatient team.  Continue hydroxychloroquine as well -Rheumatology decrease the dose of steroid to daily dose, plan to taper to her home dose in next 2 to 3 weeks.   Seizure disorder-apparently patient missed a dose of Vimpat prior to the seizure.  She was on Keppra in the past but she was changed to Vimpat due to breakthrough seizures.  She is currently on 150 mg every 12, she has been seizure-free.  Neurology signed off on 11/27   Acute kidney injury on chronic kidney disease stage IIIb -nephrology consulted, has progressed to the point that she had to have a dialysis catheter placed on 11/28 and underwent HD same day.   Patient was seen with Dr. Zollie Scale today.  Had a long discussion regarding renal transplant and hemodialysis versus peritoneal dialysis.  Patient is leaning more towards peritoneal dialysis.  Also awaiting transfer to [facility].  Received 3 days of daily HD.  Patient would like to have peritoneal dialysis. -Going for PD catheter was placed with general surgery on 08/27/2021. -Nephrology talked with her nephrologist at [facility] and they decided to hold off to  renal biopsy at this time.   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  Cardiology consulted due to worsening EF 30-35% down from 55% in September 2022 and moderate to large pericardial effusion.  Appreciate input, volume management with dialysis, no evidence of cardiac tamponade and per cardiology no role for NSAIDs.  For now she is to continue carvedilol, to consider resuming spironolactone and Entresto if there is no chance of renal recovery.  Empagliflozin is contraindicated in ESRD -Cardiology started her on BiDil -Cardiology also started low-dose Entresto after talking with nephrology. -Continue with carvedilol   Hyperkalemia, hyperphosphatemia-due to renal failure, managed with HD   Non-anion gap metabolic acidosis-resolved.  Most likely due to renal failure   Anemia of chronic renal disease, history of hemolytic anemia-monitor hemoglobin, currently 7.8 and seems stable. -Continue to monitor -Transfuse if below 7   Thrombocytopenia-due to renal failure, monitor, currently at 144   Essential hypertension-continue carvedilol, clonidine, nifedipine   Hyperlipidemia-continue statin  Discharge Diagnoses:  Principal Problem:   Sepsis ([place]) Active Problems:   Systemic lupus erythematosus ([place])   Acute respiratory failure with hypoxia (HCC)   Acute renal failure superimposed on chronic kidney disease (HCC)   Chronic HFrEF (heart failure with reduced ejection fraction) (HCC)   Hypertension   Hyperlipidemia   Seizures (HCC)   Chronic hemolytic anemia (HCC)   Metabolic acidosis, normal anion gap (NAG)   Pericardial effusion   Multifocal pneumonia   Discharge Instructions  Discharge Instructions     Diet - low sodium heart healthy   Complete by: As directed    Increase activity slowly   Complete by: As directed    No dressing needed   Complete by: As directed       Allergies as of  08/28/2021       Reactions   Amlodipine Swelling        Medication List     STOP taking these medications    Ampicillin-Sulbactam 3 g in sodium chloride 0.9 % 100 mL   chlorthalidone 50 MG tablet Commonly known as: HYGROTON   NIFEdipine 90 MG 24 hr tablet Commonly known as: ADALAT CC       TAKE these medications    carvedilol 25 MG tablet Commonly known as: COREG Take 25 mg by mouth daily.   cloNIDine 0.3 mg/24hr patch Commonly known as: CATAPRES - Dosed in mg/24 hr Place 1 patch (0.3 mg total) onto the skin once a week.   feeding supplement (NEPRO CARB STEADY) Liqd Take 237 mLs by mouth 3 (three) times daily between meals.   hydroxychloroquine 200 MG tablet Commonly known as: PLAQUENIL Take 400 mg by mouth daily.   isosorbide-hydrALAZINE 20-37.5 MG tablet Commonly known as: BIDIL Take 1 tablet by mouth 3 (three) times daily.   lacosamide 50 MG Tabs tablet Commonly known as: VIMPAT Take 150 mg by mouth 2 (two) times daily.   methylPREDNISolone sodium succinate 125 mg/2 mL injection Commonly known as: SOLU-MEDROL Inject 1.28 mLs (80 mg total) into the vein daily. Please taper slowly to her home dose   multivitamin Tabs tablet Take 1 tablet by mouth at bedtime.   pantoprazole 40 MG tablet Commonly known as: PROTONIX Take 1  tablet (40 mg total) by mouth daily.   polyethylene glycol 17 g packet Commonly known as: MIRALAX / GLYCOLAX Take 17 g by mouth daily as needed for moderate constipation.   pravastatin 20 MG tablet Commonly known as: PRAVACHOL Take 20 mg by mouth daily.   predniSONE 20 MG tablet Commonly known as: DELTASONE Take 20 mg by mouth daily with breakfast.   sacubitril-valsartan 24-26 MG Commonly known as: ENTRESTO Take 1 tablet by mouth 2 (two) times daily.   spironolactone 25 MG tablet Commonly known as: ALDACTONE Take 12.5 mg by mouth daily.   torsemide 20 MG tablet Commonly known as: DEMADEX Take 40 mg by mouth daily.                Discharge Care Instructions  (From admission, onward)           Start     Ordered   08/28/21 0000  No dressing needed        08/28/21 8366            Allergies  Allergen Reactions   Amlodipine Swelling    Consultations: Nephrology  Cardiology  PCCM Neurology  Vascular surgery Rheumatology  Procedures/Studies: CT ABDOMEN PELVIS WO CONTRAST  Result Date: 08/22/2021 CLINICAL DATA:  Abdominal distension Nausea/vomiting Abdominal pain, acute, nonlocalized EXAM: CT ABDOMEN AND PELVIS WITHOUT CONTRAST TECHNIQUE: Multidetector CT imaging of the abdomen and pelvis was performed following the standard protocol without IV contrast. COMPARISON:  Chest x-ray 08/21/2021 FINDINGS: Lower chest: Marked peribronchovascular ground-glass airspace opacities of the visualized lung. At least small volume pericardial effusion partially visualized. Trace right pleural effusion. Hepatobiliary: No focal liver abnormality. Likely question gallbladder wall thickening with markedly limited evaluation on this noncontrast study. No biliary dilatation. Query mild periportal edema. Pancreas: No focal lesion. Normal pancreatic contour. No surrounding inflammatory changes. No main pancreatic ductal dilatation. Spleen: Normal in size without focal abnormality. Adrenals/Urinary Tract: No adrenal nodule bilaterally. No nephrolithiasis and no hydronephrosis. No definite contour-deforming renal mass. No ureterolithiasis or hydroureter. The urinary bladder is unremarkable. Stomach/Bowel: Stomach is within normal limits. No evidence of bowel wall thickening or dilatation. Appendix appears normal. Vascular/Lymphatic: No abdominal aorta or iliac aneurysm. No abdominal, pelvic, or inguinal lymphadenopathy. Reproductive: Uterus and bilateral adnexa are unremarkable. Other: Trace free fluid within the abdomen and pelvis. No intraperitoneal free gas. No organized fluid collection. Musculoskeletal: No  abdominal wall hernia or abnormality. No suspicious lytic or blastic osseous lesions. No acute displaced fracture. IMPRESSION: 1. Pulmonary findings suggestive of infection/inflammation versus edema versus alveolar hemorrhage. 2. Partially visualized at least small volume pericardial effusion. 3. Trace right pleural effusion. 4. Likely gallbladder wall thickening with markedly limited evaluation on this noncontrast study. Recommend right upper quadrant ultrasound for further evaluation. 5. Trace simple free fluid within the abdomen and pelvis. Electronically Signed   By: Iven Finn M.D.   On: 08/22/2021 23:59   CT HEAD WO CONTRAST (5MM)  Result Date: 08/21/2021 CLINICAL DATA:  Status post seizure. EXAM: CT HEAD WITHOUT CONTRAST TECHNIQUE: Contiguous axial images were obtained from the base of the skull through the vertex without intravenous contrast. COMPARISON:  None. FINDINGS: Brain: No evidence of acute infarction, hemorrhage, hydrocephalus, extra-axial collection or mass lesion/mass effect. Vascular: No hyperdense vessel or unexpected calcification. Skull: Normal. Negative for fracture or focal lesion. Sinuses/Orbits: No acute finding. Other: None. IMPRESSION: No acute intracranial pathology. Electronically Signed   By: Virgina Norfolk M.D.   On: 08/21/2021 03:49   CT Cervical Spine Wo Contrast  Result Date: 08/21/2021 CLINICAL DATA:  Status post seizure. EXAM: CT CERVICAL SPINE WITHOUT CONTRAST TECHNIQUE: Multidetector CT imaging of the cervical spine was performed without intravenous contrast. Multiplanar CT image reconstructions were also generated. COMPARISON:  None. FINDINGS: Alignment: Normal. Skull base and vertebrae: No acute fracture. No primary bone lesion or focal pathologic process. Soft tissues and spinal canal: No prevertebral fluid or swelling. No visible canal hematoma. Disc levels: Normal multilevel endplates are seen with normal multilevel intervertebral disc spaces. Normal  bilateral multilevel facet joints are noted. Upper chest: Negative. Other: None. IMPRESSION: No acute fracture or subluxation of the cervical spine. Electronically Signed   By: Virgina Norfolk M.D.   On: 08/21/2021 03:51   PERIPHERAL VASCULAR CATHETERIZATION  Result Date: 08/23/2021 See surgical note for result.  DG Chest Portable 1 View  Result Date: 08/21/2021 CLINICAL DATA:  Initial evaluation for acute fever, seizure. EXAM: PORTABLE CHEST 1 VIEW COMPARISON:  Prior radiograph from 12/09/2020 FINDINGS: Marked cardiomegaly, stable. Mediastinal silhouette within normal limits. Lungs normally inflated. Prominent patchy airspace opacity seen throughout the right upper and lower lobes, concerning for multifocal pneumonia. Possible patchy involvement of the left lung base as well. Underlying mild perihilar vascular congestion without overt pulmonary edema. Probable trace bilateral pleural effusions. No pneumothorax. No acute osseous finding. IMPRESSION: 1. Extensive patchy airspace disease throughout the right upper and lower lobes, concerning for multifocal pneumonia. 2. Underlying cardiomegaly with perihilar vascular congestion without overt pulmonary edema. 3. Probable trace bilateral pleural effusions. Electronically Signed   By: Jeannine Boga M.D.   On: 08/21/2021 01:18   ECHOCARDIOGRAM COMPLETE  Result Date: 08/22/2021    ECHOCARDIOGRAM REPORT   Patient Name:   [facility] Date of Exam: 08/22/2021 Medical Rec #:  [id number]    Height:       66.0 in Accession #:    [id number]   Weight:       156.3 lb Date of Birth:  2000/11/11    BSA:          1.801 m Patient Age:    20 years     BP:           149/87 mmHg Patient Gender: F            HR:           106 bpm. Exam Location:  [facility] Procedure: 2D Echo, Color Doppler and Cardiac Doppler Indications:     I42.0 Dilated cardiomyopathy  History:         Patient has prior history of Echocardiogram examinations.                  Lupus; Risk  Factors:Hypertension.  Sonographer:     Charmayne Sheer Referring Phys:  [id number] BRITTON L R[place] Diagnosing Phys: Ida Rogue MD  Sonographer Comments: Suboptimal apical window and suboptimal subcostal window. Image acquisition challenging due to uncooperative patient. Global longitudinal strain was attempted. IMPRESSIONS  1. Moderate pericardial effusion. The pericardial effusion is posterior and lateral to the left ventricle. There is no evidence of cardiac tamponade.  2. Left ventricular ejection fraction, by estimation, is 30 to 35%. The left ventricle has moderately decreased function. The left ventricle demonstrates global hypokinesis. There is mild left ventricular hypertrophy. Left ventricular diastolic parameters are consistent with Grade II diastolic dysfunction (pseudonormalization). The average left ventricular global longitudinal strain is -5.1 %. The global longitudinal strain is abnormal.  3. Right ventricular systolic function is normal. The right ventricular size is normal. There is normal  pulmonary artery systolic pressure. The estimated right ventricular systolic pressure is 83.6 mmHg.  4. Left atrial size was moderately dilated.  5. Right atrial size was mildly dilated.  6. The mitral valve is normal in structure. Mild mitral valve regurgitation. No evidence of mitral stenosis.  7. Tricuspid valve regurgitation is moderate.  8. The inferior vena cava is normal in size with greater than 50% respiratory variability, suggesting right atrial pressure of 3 mmHg. FINDINGS  Left Ventricle: Left ventricular ejection fraction, by estimation, is 30 to 35%. The left ventricle has moderately decreased function. The left ventricle demonstrates global hypokinesis. The average left ventricular global longitudinal strain is -5.1 %.  The global longitudinal strain is abnormal. The left ventricular internal cavity size was normal in size. There is mild left ventricular hypertrophy. Left ventricular diastolic  parameters are consistent with Grade II diastolic dysfunction (pseudonormalization). Right Ventricle: The right ventricular size is normal. No increase in right ventricular wall thickness. Right ventricular systolic function is normal. There is normal pulmonary artery systolic pressure. The tricuspid regurgitant velocity is 2.25 m/s, and  with an assumed right atrial pressure of 5 mmHg, the estimated right ventricular systolic pressure is 62.9 mmHg. Left Atrium: Left atrial size was moderately dilated. Right Atrium: Right atrial size was mildly dilated. Pericardium: A moderately sized pericardial effusion is present. The pericardial effusion is posterior and lateral to the left ventricle. There is no evidence of cardiac tamponade. Mitral Valve: The mitral valve is normal in structure. There is mild thickening of the mitral valve leaflet(s). There is mild calcification of the mitral valve leaflet(s). Mild mitral valve regurgitation. No evidence of mitral valve stenosis. MV peak gradient, 5.0 mmHg. The mean mitral valve gradient is 3.0 mmHg. Tricuspid Valve: The tricuspid valve is normal in structure. Tricuspid valve regurgitation is moderate . No evidence of tricuspid stenosis. Aortic Valve: The aortic valve is normal in structure. Aortic valve regurgitation is not visualized. No aortic stenosis is present. Aortic valve mean gradient measures 2.0 mmHg. Aortic valve peak gradient measures 2.7 mmHg. Aortic valve area, by VTI measures 2.73 cm. Pulmonic Valve: The pulmonic valve was normal in structure. Pulmonic valve regurgitation is mild. No evidence of pulmonic stenosis. Aorta: The aortic root is normal in size and structure. Venous: The inferior vena cava is normal in size with greater than 50% respiratory variability, suggesting right atrial pressure of 3 mmHg. IAS/Shunts: No atrial level shunt detected by color flow Doppler.  LEFT VENTRICLE PLAX 2D LVIDd:         5.40 cm   Diastology LVIDs:         4.40 cm   LV e'  medial:    7.51 cm/s LV PW:         1.30 cm   LV E/e' medial:  12.9 LV IVS:        1.10 cm   LV e' lateral:   8.81 cm/s LVOT diam:     2.10 cm   LV E/e' lateral: 11.0 LV SV:         33 LV SV Index:   18        2D Longitudinal Strain LVOT Area:     3.46 cm  2D Strain GLS Avg:     -5.1 %  RIGHT VENTRICLE RV Basal diam:  3.70 cm RV S prime:     7.83 cm/s LEFT ATRIUM             Index  RIGHT ATRIUM           Index LA diam:        5.00 cm 2.78 cm/m   RA Area:     20.60 cm LA Vol (A2C):   30.6 ml 16.99 ml/m  RA Volume:   55.70 ml  30.93 ml/m LA Vol (A4C):   52.4 ml 29.10 ml/m LA Biplane Vol: 44.1 ml 24.49 ml/m  AORTIC VALVE                    PULMONIC VALVE AV Area (Vmax):    3.01 cm     PV Vmax:       0.70 m/s AV Area (Vmean):   2.97 cm     PV Vmean:      47.100 cm/s AV Area (VTI):     2.73 cm     PV VTI:        0.134 m AV Vmax:           81.90 cm/s   PV Peak grad:  2.0 mmHg AV Vmean:          59.800 cm/s  PV Mean grad:  1.0 mmHg AV VTI:            0.120 m AV Peak Grad:      2.7 mmHg AV Mean Grad:      2.0 mmHg LVOT Vmax:         71.10 cm/s LVOT Vmean:        51.200 cm/s LVOT VTI:          0.095 m LVOT/AV VTI ratio: 0.79  AORTA Ao Root diam: 3.20 cm MITRAL VALVE               TRICUSPID VALVE MV Area (PHT): 6.07 cm    TR Peak grad:   20.2 mmHg MV Area VTI:   2.75 cm    TR Vmax:        225.00 cm/s MV Peak grad:  5.0 mmHg MV Mean grad:  3.0 mmHg    SHUNTS MV Vmax:       1.12 m/s    Systemic VTI:  0.09 m MV Vmean:      70.6 cm/s   Systemic Diam: 2.10 cm MV Decel Time: 125 msec MV E velocity: 96.65 cm/s Ida Rogue MD Electronically signed by Ida Rogue MD Signature Date/Time: 08/22/2021/2:05:10 PM    Final    US Abdomen Limited RUQ (LIVER/GB)  Result Date: 08/23/2021 CLINICAL DATA:  History of chronic renal disease with gallbladder wall thickening seen on recent abdomen pelvis CT. EXAM: ULTRASOUND ABDOMEN LIMITED RIGHT UPPER QUADRANT COMPARISON:  Abdomen pelvis CT, dated August 22, 2021.  FINDINGS: Gallbladder: A thin, curvilinear nonshadowing echogenic area is seen along the wall of the gallbladder fundus. No gallstones or wall thickening visualized (2.8 mm). No sonographic Murphy sign noted by sonographer. Common bile duct: Diameter: 4.2 mm Liver: No focal lesion identified. Within normal limits in parenchymal echogenicity. Portal vein is patent on color Doppler imaging with normal direction of blood flow towards the liver. Other: Of incidental note is the presence a right-sided pleural effusion and an echogenic right kidney. IMPRESSION: 1. Thin, echogenic area along the wall of the gallbladder fundus which may represent very mild gallbladder wall calcification. 2. No evidence of acute cholecystitis. Further evaluation with a nuclear medicine hepatobiliary scan is recommended if this is of clinical concern. 3. Small right pleural effusion. 4. Echogenic right kidney which may be secondary to  medical renal disease related to the patient's history of chronic renal disease. Electronically Signed   By: Virgina Norfolk M.D.   On: 08/23/2021 02:15    Subjective: Patient was seen and examined today.  No new complaints.  Had her PD catheter placed yesterday.  Patient did not had a good night sleep and appears little down, stating that she is tired and wants to get some rest.  Grandparents at bedside.  Discharge Exam: Vitals:   08/28/21 0359 08/28/21 0800  BP: 136/90 123/83  Pulse: 93 92  Resp: 14 20  Temp: 98.5 F (36.9 C) 98.6 F (37 C)  SpO2: 93% 100%   Vitals:   08/28/21 0059 08/28/21 0133 08/28/21 0359 08/28/21 0800  BP: (!) 178/118 (!) 152/102 136/90 123/83  Pulse: 73 73 93 92  Resp: 17  14 20   Temp:   98.5 F (36.9 C) 98.6 F (37 C)  TempSrc:   Oral   SpO2: 100%  93% 100%  Weight:      Height:        General: Pt is alert, awake, not in acute distress Cardiovascular: RRR, S1/S2 +, no rubs, no gallops Respiratory: CTA bilaterally, no wheezing, no rhonchi Abdominal:  Soft, NT, ND, bowel sounds + Extremities: no edema, no cyanosis   The results of significant diagnostics from this hospitalization (including imaging, microbiology, ancillary and laboratory) are listed below for reference.    Microbiology: Recent Results (from the past 240 hour(s))  Resp Panel by RT-PCR (Flu A&B, Covid) Nasopharyngeal Swab     Status: None   Collection Time: 08/21/21  1:00 AM   Specimen: Nasopharyngeal Swab; Nasopharyngeal(NP) swabs in vial transport medium  Result Value Ref Range Status   SARS Coronavirus 2 by RT PCR NEGATIVE NEGATIVE Final    Comment: (NOTE) SARS-CoV-2 target nucleic acids are NOT DETECTED.  The SARS-CoV-2 RNA is generally detectable in upper respiratory specimens during the acute phase of infection. The lowest concentration of SARS-CoV-2 viral copies this assay can detect is 138 copies/mL. A negative result does not preclude SARS-Cov-2 infection and should not be used as the sole basis for treatment or other patient management decisions. A negative result may occur with  improper specimen collection/handling, submission of specimen other than nasopharyngeal swab, presence of viral mutation(s) within the areas targeted by this assay, and inadequate number of viral copies(<138 copies/mL). A negative result must be combined with clinical observations, patient history, and epidemiological information. The expected result is Negative.  Fact Sheet for Patients:  EntrepreneurPulse.com.au  Fact Sheet for Healthcare Providers:  IncredibleEmployment.be  This test is no t yet approved or cleared by the [place] FDA and  has been authorized for detection and/or diagnosis of SARS-CoV-2 by FDA under an Emergency Use Authorization (EUA). This EUA will remain  in effect (meaning this test can be used) for the duration of the COVID-19 declaration under Section 564(b)(1) of the Act, 21 U.S.C.section 360bbb-3(b)(1),  unless the authorization is terminated  or revoked sooner.       Influenza A by PCR NEGATIVE NEGATIVE Final   Influenza B by PCR NEGATIVE NEGATIVE Final    Comment: (NOTE) The Xpert Xpress SARS-CoV-2/FLU/RSV plus assay is intended as an aid in the diagnosis of influenza from Nasopharyngeal swab specimens and should not be used as a sole basis for treatment. Nasal washings and aspirates are unacceptable for Xpert Xpress SARS-CoV-2/FLU/RSV testing.  Fact Sheet for Patients: EntrepreneurPulse.com.au  Fact Sheet for Healthcare Providers: IncredibleEmployment.be  This test is not  yet approved or cleared by the [place] and has been authorized for detection and/or diagnosis of SARS-CoV-2 by FDA under an Emergency Use Authorization (EUA). This EUA will remain in effect (meaning this test can be used) for the duration of the COVID-19 declaration under Section 564(b)(1) of the Act, 21 U.S.C. section 360bbb-3(b)(1), unless the authorization is terminated or revoked.  Performed at [facility], [address] [place], [place] [address]   Blood Culture (routine x 2)     Status: None   Collection Time: 08/21/21  1:04 AM   Specimen: BLOOD  Result Value Ref Range Status   Specimen Description BLOOD RIGHT FOREARM  Final   Special Requests   Final    BOTTLES DRAWN AEROBIC AND ANAEROBIC Blood Culture results may not be optimal due to an inadequate volume of blood received in culture bottles   Culture   Final    NO GROWTH 5 DAYS Performed at [facility], [address] [place], [place] [address]    Report Status 08/26/2021 FINAL  Final  Blood Culture (routine x 2)     Status: None   Collection Time: 08/21/21  1:05 AM   Specimen: BLOOD  Result Value Ref Range Status   Specimen Description BLOOD RIGHT HAND  Final   Special Requests   Final    BOTTLES DRAWN AEROBIC AND ANAEROBIC Blood Culture adequate volume   Culture    Final    NO GROWTH 5 DAYS Performed at [facility], [address] [place], [place] [address]    Report Status 08/26/2021 FINAL  Final  Urine Culture     Status: Abnormal   Collection Time: 08/21/21  2:05 AM   Specimen: In/Out Cath Urine  Result Value Ref Range Status   Specimen Description   Final    IN/OUT CATH URINE Performed at [facility], [address], [place], [place] [address]    Special Requests   Final    NONE Performed at [facility], [address] [place], [place] [address]    Culture MULTIPLE SPECIES PRESENT, SUGGEST RECOLLECTION (A)  Final   Report Status 08/22/2021 FINAL  Final  MRSA Next Gen by PCR, Nasal     Status: None   Collection Time: 08/21/21  4:50 AM   Specimen: Nasal Mucosa; Nasal Swab  Result Value Ref Range Status   MRSA by PCR Next Gen NOT DETECTED NOT DETECTED Final    Comment: (NOTE) The GeneXpert MRSA Assay (FDA approved for NASAL specimens only), is one component of a comprehensive MRSA colonization surveillance program. It is not intended to diagnose MRSA infection nor to guide or monitor treatment for MRSA infections. Test performance is not FDA approved in patients less than 46 years old. Performed at [facility], [place],  [address]   Respiratory (~20 pathogens) panel by PCR     Status: None   Collection Time: 08/21/21  7:06 AM   Specimen: Nasopharyngeal Swab; Respiratory  Result Value Ref Range Status   Adenovirus NOT DETECTED NOT DETECTED Final   Coronavirus 229E NOT DETECTED NOT DETECTED Final    Comment: (NOTE) The Coronavirus on the Respiratory Panel, DOES NOT test for the novel  Coronavirus (2019 nCoV)    Coronavirus HKU1 NOT DETECTED NOT DETECTED Final   Coronavirus NL63 NOT DETECTED NOT DETECTED Final   Coronavirus OC43 NOT DETECTED NOT DETECTED Final   Metapneumovirus NOT DETECTED NOT DETECTED Final   Rhinovirus / Enterovirus  NOT DETECTED NOT DETECTED  Final   Influenza A NOT DETECTED NOT DETECTED Final   Influenza B NOT DETECTED NOT DETECTED Final   Parainfluenza Virus 1 NOT DETECTED NOT DETECTED Final   Parainfluenza Virus 2 NOT DETECTED NOT DETECTED Final   Parainfluenza Virus 3 NOT DETECTED NOT DETECTED Final   Parainfluenza Virus 4 NOT DETECTED NOT DETECTED Final   Respiratory Syncytial Virus NOT DETECTED NOT DETECTED Final   Bordetella pertussis NOT DETECTED NOT DETECTED Final   Bordetella Parapertussis NOT DETECTED NOT DETECTED Final   Chlamydophila pneumoniae NOT DETECTED NOT DETECTED Final   Mycoplasma pneumoniae NOT DETECTED NOT DETECTED Final    Comment: Performed at [facility], [place] [address] [place], [place] [address]     Labs: BNP (last 3 results) Recent Labs    12/09/20 0527 08/21/21 0101 08/21/21 0531  BNP >4,500.0* >4,500.0* >2,263.3*   Basic Metabolic Panel: Recent Labs  Lab 08/22/21 0409 08/22/21 2008 08/23/21 0548 08/23/21 1110 08/24/21 0416 08/25/21 0414 08/26/21 0406  NA 137  --  135  --  134* 134*  133* 135  K 6.1*   < > 5.7* 5.6* 4.9 4.5  4.5 4.3  CL 106  --  106  --  101 100  99 100  CO2 20*  --  18*  --  21* 26  25 25   GLUCOSE 133*  --  142*  --  179* 163*  166* 144*  BUN 97*  --  89*  --  89* 76*  76* 43*  CREATININE 8.78*  --  9.11*  --  8.17* 6.18*  6.09* 3.71*  CALCIUM 7.7*  --  8.2*  --  7.8* 7.6*  7.5* 7.8*  MG 2.9*  --  2.7*  --  2.3  --   --   PHOS 5.8*  --  6.7* 7.2* 7.4*  7.4* 5.1* 4.7*   < > = values in this interval not displayed.   Liver Function Tests: Recent Labs  Lab 08/23/21 0548 08/24/21 0416 08/25/21 0414 08/26/21 0406  AST 22  --  21  --   ALT 17  --  24  --   ALKPHOS 48  --  69  --   BILITOT 0.9  --  1.0  --   PROT 6.1*  --  5.5*  --   ALBUMIN 2.7* 2.8* 2.8*  2.7* 2.8*   No results for input(s): LIPASE, AMYLASE in the last 168 hours. No results for input(s): AMMONIA in the last 168 hours. CBC: Recent Labs  Lab 08/22/21 0409  08/22/21 1626 08/23/21 0548 08/24/21 0416 08/25/21 0414 08/26/21 0406  WBC 32.8*  --  21.0* 9.3 10.7* 7.6  NEUTROABS  --   --   --  8.6* 9.8* 6.8  HGB 5.9* 7.8* 8.1* 7.5* 7.8* 7.8*  HCT 18.2* 23.2* 23.4* 21.9* 23.5* 24.4*  MCV 86.7  --  83.6 83.9 86.1 86.8  PLT 120*  --  101* 110* 100* 82*   Cardiac Enzymes: Recent Labs  Lab 08/21/21 0957  CKTOTAL 75   BNP: Invalid input(s): POCBNP CBG: Recent Labs  Lab 08/24/21 1938 08/25/21 0025 08/25/21 0322 08/25/21 0803 08/25/21 1118  GLUCAP 134* 147* 137* 140* 136*   D-Dimer No results for input(s): DDIMER in the last 72 hours. Hgb A1c No results for input(s): HGBA1C in the last 72 hours. Lipid Profile No results for input(s): CHOL, HDL, LDLCALC, TRIG, CHOLHDL, LDLDIRECT in the last 72 hours. Thyroid function studies No results for  input(s): TSH, T4TOTAL, T3FREE, THYROIDAB in the last 72 hours.  Invalid input(s): FREET3 Anemia work up No results for input(s): VITAMINB12, FOLATE, FERRITIN, TIBC, IRON, RETICCTPCT in the last 72 hours. Urinalysis    Component Value Date/Time   COLORURINE YELLOW 08/23/2021 0109   APPEARANCEUR CLEAR 08/23/2021 0109   LABSPEC 1.020 08/23/2021 0109   PHURINE 7.0 08/23/2021 0109   GLUCOSEU NEGATIVE 08/23/2021 0109   HGBUR SMALL (A) 08/23/2021 0109   BILIRUBINUR NEGATIVE 08/23/2021 0109   KETONESUR NEGATIVE 08/23/2021 0109   PROTEINUR >300 (A) 08/23/2021 0109   NITRITE NEGATIVE 08/23/2021 0109   LEUKOCYTESUR NEGATIVE 08/23/2021 0109   Sepsis Labs Invalid input(s): PROCALCITONIN,  WBC,  LACTICIDVEN Microbiology Recent Results (from the past 240 hour(s))  Resp Panel by RT-PCR (Flu A&B, Covid) Nasopharyngeal Swab     Status: None   Collection Time: 08/21/21  1:00 AM   Specimen: Nasopharyngeal Swab; Nasopharyngeal(NP) swabs in vial transport medium  Result Value Ref Range Status   SARS Coronavirus 2 by RT PCR NEGATIVE NEGATIVE Final    Comment: (NOTE) SARS-CoV-2 target nucleic acids are  NOT DETECTED.  The SARS-CoV-2 RNA is generally detectable in upper respiratory specimens during the acute phase of infection. The lowest concentration of SARS-CoV-2 viral copies this assay can detect is 138 copies/mL. A negative result does not preclude SARS-Cov-2 infection and should not be used as the sole basis for treatment or other patient management decisions. A negative result may occur with  improper specimen collection/handling, submission of specimen other than nasopharyngeal swab, presence of viral mutation(s) within the areas targeted by this assay, and inadequate number of viral copies(<138 copies/mL). A negative result must be combined with clinical observations, patient history, and epidemiological information. The expected result is Negative.  Fact Sheet for Patients:  EntrepreneurPulse.com.au  Fact Sheet for Healthcare Providers:  IncredibleEmployment.be  This test is no t yet approved or cleared by the [place] FDA and  has been authorized for detection and/or diagnosis of SARS-CoV-2 by FDA under an Emergency Use Authorization (EUA). This EUA will remain  in effect (meaning this test can be used) for the duration of the COVID-19 declaration under Section 564(b)(1) of the Act, 21 U.S.C.section 360bbb-3(b)(1), unless the authorization is terminated  or revoked sooner.       Influenza A by PCR NEGATIVE NEGATIVE Final   Influenza B by PCR NEGATIVE NEGATIVE Final    Comment: (NOTE) The Xpert Xpress SARS-CoV-2/FLU/RSV plus assay is intended as an aid in the diagnosis of influenza from Nasopharyngeal swab specimens and should not be used as a sole basis for treatment. Nasal washings and aspirates are unacceptable for Xpert Xpress SARS-CoV-2/FLU/RSV testing.  Fact Sheet for Patients: EntrepreneurPulse.com.au  Fact Sheet for Healthcare Providers: IncredibleEmployment.be  This test is not  yet approved or cleared by the [place] FDA and has been authorized for detection and/or diagnosis of SARS-CoV-2 by FDA under an Emergency Use Authorization (EUA). This EUA will remain in effect (meaning this test can be used) for the duration of the COVID-19 declaration under Section 564(b)(1) of the Act, 21 U.S.C. section 360bbb-3(b)(1), unless the authorization is terminated or revoked.  Performed at [facility], [address] [place], [place] [address]   Blood Culture (routine x 2)     Status: None   Collection Time: 08/21/21  1:04 AM   Specimen: BLOOD  Result Value Ref Range Status   Specimen Description BLOOD RIGHT FOREARM  Final   Special Requests   Final  BOTTLES DRAWN AEROBIC AND ANAEROBIC Blood Culture results may not be optimal due to an inadequate volume of blood received in culture bottles   Culture   Final    NO GROWTH 5 DAYS Performed at [facility], [address] [place], [place] [address]    Report Status 08/26/2021 FINAL  Final  Blood Culture (routine x 2)     Status: None   Collection Time: 08/21/21  1:05 AM   Specimen: BLOOD  Result Value Ref Range Status   Specimen Description BLOOD RIGHT HAND  Final   Special Requests   Final    BOTTLES DRAWN AEROBIC AND ANAEROBIC Blood Culture adequate volume   Culture   Final    NO GROWTH 5 DAYS Performed at [facility], [address] [place], [place] [address]    Report Status 08/26/2021 FINAL  Final  Urine Culture     Status: Abnormal   Collection Time: 08/21/21  2:05 AM   Specimen: In/Out Cath Urine  Result Value Ref Range Status   Specimen Description   Final    IN/OUT CATH URINE Performed at [facility], [address], [place], [place] [address]    Special Requests   Final    NONE Performed at [facility], [address] [place], [place] [address]    Culture MULTIPLE SPECIES PRESENT, SUGGEST RECOLLECTION (A)  Final   Report Status  08/22/2021 FINAL  Final  MRSA Next Gen by PCR, Nasal     Status: None   Collection Time: 08/21/21  4:50 AM   Specimen: Nasal Mucosa; Nasal Swab  Result Value Ref Range Status   MRSA by PCR Next Gen NOT DETECTED NOT DETECTED Final    Comment: (NOTE) The GeneXpert MRSA Assay (FDA approved for NASAL specimens only), is one component of a comprehensive MRSA colonization surveillance program. It is not intended to diagnose MRSA infection nor to guide or monitor treatment for MRSA infections. Test performance is not FDA approved in patients less than 54 years old. Performed at [facility], [address] [place], [place] [address]   Respiratory (~20 pathogens) panel by PCR     Status: None   Collection Time: 08/21/21  7:06 AM   Specimen: Nasopharyngeal Swab; Respiratory  Result Value Ref Range Status   Adenovirus NOT DETECTED NOT DETECTED Final   Coronavirus 229E NOT DETECTED NOT DETECTED Final    Comment: (NOTE) The Coronavirus on the Respiratory Panel, DOES NOT test for the novel  Coronavirus (2019 nCoV)    Coronavirus HKU1 NOT DETECTED NOT DETECTED Final   Coronavirus NL63 NOT DETECTED NOT DETECTED Final   Coronavirus OC43 NOT DETECTED NOT DETECTED Final   Metapneumovirus NOT DETECTED NOT DETECTED Final   Rhinovirus / Enterovirus NOT DETECTED NOT DETECTED Final   Influenza A NOT DETECTED NOT DETECTED Final   Influenza B NOT DETECTED NOT DETECTED Final   Parainfluenza Virus 1 NOT DETECTED NOT DETECTED Final   Parainfluenza Virus 2 NOT DETECTED NOT DETECTED Final   Parainfluenza Virus 3 NOT DETECTED NOT DETECTED Final   Parainfluenza Virus 4 NOT DETECTED NOT DETECTED Final   Respiratory Syncytial Virus NOT DETECTED NOT DETECTED Final   Bordetella pertussis NOT DETECTED NOT DETECTED Final   Bordetella Parapertussis NOT DETECTED NOT DETECTED Final   Chlamydophila pneumoniae NOT DETECTED NOT DETECTED Final   Mycoplasma pneumoniae NOT DETECTED NOT DETECTED Final     Comment: Performed at [facility], [place]. [address] [place], [place]  [address]    Time coordinating discharge: Over 30 minutes  SIGNED:  Lorella Nimrod, MD  [facility] 08/28/2021, 8:30 AM  If 7PM-7AM, please contact night-coverage [email or web address]  This record has been created using Systems analyst. Errors have been sought and corrected,but may not always be located. Such creation errors do not reflect on the standard of care.

## 2021-08-28 NOTE — Progress Notes (Signed)
[facility]  ROUNDING NOTE   Subjective:   Patient seen today on first floor Patient resting comfortably on the bed Patient offers no new specific physical complaints   Objective:  Vital signs in last 24 hours:  Temp:  [97.6 F (36.4 C)-98.7 F (37.1 C)] 98.6 F (37 C) (12/03 0800) Pulse Rate:  [73-96] 92 (12/03 0800) Resp:  [11-22] 20 (12/03 0800) BP: (123-178)/(83-128) 123/83 (12/03 0800) SpO2:  [93 %-100 %] 100 % (12/03 0800) Weight:  [66.7 kg-67 kg] 66.7 kg (12/02 2030)  Weight change:  Filed Weights   08/25/21 1831 08/27/21 1646 08/27/21 2030  Weight: 67.8 kg 67 kg 66.7 kg    Intake/Output: I/O last 3 completed shifts: In: 690 [P.O.:240; I.V.:450] Out: 499 [Other:499]   Intake/Output this shift:  No intake/output data recorded.  Physical Exam: General: No acute distress  Head: Normocephalic, atraumatic. Moist oral mucosal membranes  Eyes: Anicteric  Neck: Supple  Lungs:  Clear to auscultation, normal effort  Heart: S1S2 no obvious rubs  Abdomen:  Soft, nontender, bowel sounds present, PD catheter in situ  Extremities: No peripheral edema.  Neurologic: Awake, alert, conversant.  Skin: No acute rash  Access: Right IJ PermCath in situ    Basic Metabolic Panel: Recent Labs  Lab 08/22/21 0409 08/22/21 2008 08/23/21 0548 08/23/21 1110 08/24/21 0416 08/25/21 0414 08/26/21 0406  NA 137  --  135  --  134* 134*  133* 135  K 6.1*   < > 5.7* 5.6* 4.9 4.5  4.5 4.3  CL 106  --  106  --  101 100  99 100  CO2 20*  --  18*  --  21* 26  25 25   GLUCOSE 133*  --  142*  --  179* 163*  166* 144*  BUN 97*  --  89*  --  89* 76*  76* 43*  CREATININE 8.78*  --  9.11*  --  8.17* 6.18*  6.09* 3.71*  CALCIUM 7.7*  --  8.2*  --  7.8* 7.6*  7.5* 7.8*  MG 2.9*  --  2.7*  --  2.3  --   --   PHOS 5.8*  --  6.7* 7.2* 7.4*  7.4* 5.1* 4.7*   < > = values in this interval not displayed.    Liver Function Tests: Recent Labs  Lab 08/23/21 0548  08/24/21 0416 08/25/21 0414 08/26/21 0406  AST 22  --  21  --   ALT 17  --  24  --   ALKPHOS 48  --  69  --   BILITOT 0.9  --  1.0  --   PROT 6.1*  --  5.5*  --   ALBUMIN 2.7* 2.8* 2.8*  2.7* 2.8*   No results for input(s): LIPASE, AMYLASE in the last 168 hours. No results for input(s): AMMONIA in the last 168 hours.  CBC: Recent Labs  Lab 08/22/21 0409 08/22/21 1626 08/23/21 0548 08/24/21 0416 08/25/21 0414 08/26/21 0406  WBC 32.8*  --  21.0* 9.3 10.7* 7.6  NEUTROABS  --   --   --  8.6* 9.8* 6.8  HGB 5.9* 7.8* 8.1* 7.5* 7.8* 7.8*  HCT 18.2* 23.2* 23.4* 21.9* 23.5* 24.4*  MCV 86.7  --  83.6 83.9 86.1 86.8  PLT 120*  --  101* 110* 100* 82*    Cardiac Enzymes: Recent Labs  Lab 08/21/21 0957  CKTOTAL 75    BNP: Invalid input(s): POCBNP  CBG: Recent Labs  Lab 08/24/21 1938  08/25/21 0025 08/25/21 0322 08/25/21 0803 08/25/21 1118  GLUCAP 134* 147* 137* 140* 136*    Microbiology: Results for orders placed or performed during the hospital encounter of 08/21/21  Resp Panel by RT-PCR (Flu A&B, Covid) Nasopharyngeal Swab     Status: None   Collection Time: 08/21/21  1:00 AM   Specimen: Nasopharyngeal Swab; Nasopharyngeal(NP) swabs in vial transport medium  Result Value Ref Range Status   SARS Coronavirus 2 by RT PCR NEGATIVE NEGATIVE Final    Comment: (NOTE) SARS-CoV-2 target nucleic acids are NOT DETECTED.  The SARS-CoV-2 RNA is generally detectable in upper respiratory specimens during the acute phase of infection. The lowest concentration of SARS-CoV-2 viral copies this assay can detect is 138 copies/mL. A negative result does not preclude SARS-Cov-2 infection and should not be used as the sole basis for treatment or other patient management decisions. A negative result may occur with  improper specimen collection/handling, submission of specimen other than nasopharyngeal swab, presence of viral mutation(s) within the areas targeted by this assay, and  inadequate number of viral copies(<138 copies/mL). A negative result must be combined with clinical observations, patient history, and epidemiological information. The expected result is Negative.  Fact Sheet for Patients:  EntrepreneurPulse.com.au  Fact Sheet for Healthcare Providers:  IncredibleEmployment.be  This test is no t yet approved or cleared by the [place] FDA and  has been authorized for detection and/or diagnosis of SARS-CoV-2 by FDA under an Emergency Use Authorization (EUA). This EUA will remain  in effect (meaning this test can be used) for the duration of the COVID-19 declaration under Section 564(b)(1) of the Act, 21 U.S.C.section 360bbb-3(b)(1), unless the authorization is terminated  or revoked sooner.       Influenza A by PCR NEGATIVE NEGATIVE Final   Influenza B by PCR NEGATIVE NEGATIVE Final    Comment: (NOTE) The Xpert Xpress SARS-CoV-2/FLU/RSV plus assay is intended as an aid in the diagnosis of influenza from Nasopharyngeal swab specimens and should not be used as a sole basis for treatment. Nasal washings and aspirates are unacceptable for Xpert Xpress SARS-CoV-2/FLU/RSV testing.  Fact Sheet for Patients: EntrepreneurPulse.com.au  Fact Sheet for Healthcare Providers: IncredibleEmployment.be  This test is not yet approved or cleared by the [place] FDA and has been authorized for detection and/or diagnosis of SARS-CoV-2 by FDA under an Emergency Use Authorization (EUA). This EUA will remain in effect (meaning this test can be used) for the duration of the COVID-19 declaration under Section 564(b)(1) of the Act, 21 U.S.C. section 360bbb-3(b)(1), unless the authorization is terminated or revoked.  Performed at [facility], [address] [place], [place] [address]   Blood Culture (routine x 2)     Status: None   Collection Time: 08/21/21  1:04 AM    Specimen: BLOOD  Result Value Ref Range Status   Specimen Description BLOOD RIGHT FOREARM  Final   Special Requests   Final    BOTTLES DRAWN AEROBIC AND ANAEROBIC Blood Culture results may not be optimal due to an inadequate volume of blood received in culture bottles   Culture   Final    NO GROWTH 5 DAYS Performed at [facility], [address], [place], [place] [address]    Report Status 08/26/2021 FINAL  Final  Blood Culture (routine x 2)     Status: None   Collection Time: 08/21/21  1:05 AM   Specimen: BLOOD  Result Value Ref Range Status   Specimen Description BLOOD RIGHT HAND  Final   Special Requests   Final    BOTTLES DRAWN AEROBIC AND ANAEROBIC Blood Culture adequate volume   Culture   Final    NO GROWTH 5 DAYS Performed at [facility], [address] [place], [place] [address]    Report Status 08/26/2021 FINAL  Final  Urine Culture     Status: Abnormal   Collection Time: 08/21/21  2:05 AM   Specimen: In/Out Cath Urine  Result Value Ref Range Status   Specimen Description   Final    IN/OUT CATH URINE Performed at [facility], [address] [place], [place] [address]    Special Requests   Final    NONE Performed at [facility], [address] [place], [place] [address]    Culture MULTIPLE SPECIES PRESENT, SUGGEST RECOLLECTION (A)  Final   Report Status 08/22/2021 FINAL  Final  MRSA Next Gen by PCR, Nasal     Status: None   Collection Time: 08/21/21  4:50 AM   Specimen: Nasal Mucosa; Nasal Swab  Result Value Ref Range Status   MRSA by PCR Next Gen NOT DETECTED NOT DETECTED Final    Comment: (NOTE) The GeneXpert MRSA Assay (FDA approved for NASAL specimens only), is one component of a comprehensive MRSA colonization surveillance program. It is not intended to diagnose MRSA infection nor to guide or monitor treatment for MRSA infections. Test performance is not FDA approved in patients less than 39  years old. Performed at [facility], [address] [place], [place] [address]   Respiratory (~20 pathogens) panel by PCR     Status: None   Collection Time: 08/21/21  7:06 AM   Specimen: Nasopharyngeal Swab; Respiratory  Result Value Ref Range Status   Adenovirus NOT DETECTED NOT DETECTED Final   Coronavirus 229E NOT DETECTED NOT DETECTED Final    Comment: (NOTE) The Coronavirus on the Respiratory Panel, DOES NOT test for the novel  Coronavirus (2019 nCoV)    Coronavirus HKU1 NOT DETECTED NOT DETECTED Final   Coronavirus NL63 NOT DETECTED NOT DETECTED Final   Coronavirus OC43 NOT DETECTED NOT DETECTED Final   Metapneumovirus NOT DETECTED NOT DETECTED Final   Rhinovirus / Enterovirus NOT DETECTED NOT DETECTED Final   Influenza A NOT DETECTED NOT DETECTED Final   Influenza B NOT DETECTED NOT DETECTED Final   Parainfluenza Virus 1 NOT DETECTED NOT DETECTED Final   Parainfluenza Virus 2 NOT DETECTED NOT DETECTED Final   Parainfluenza Virus 3 NOT DETECTED NOT DETECTED Final   Parainfluenza Virus 4 NOT DETECTED NOT DETECTED Final   Respiratory Syncytial Virus NOT DETECTED NOT DETECTED Final   Bordetella pertussis NOT DETECTED NOT DETECTED Final   Bordetella Parapertussis NOT DETECTED NOT DETECTED Final   Chlamydophila pneumoniae NOT DETECTED NOT DETECTED Final   Mycoplasma pneumoniae NOT DETECTED NOT DETECTED Final    Comment: Performed at [facility], [place]. [address] [place], [place] [address]    Coagulation Studies: Recent Labs    08/26/21 1029  LABPROT 13.2  INR 1.0    Urinalysis: No results for input(s): COLORURINE, LABSPEC, PHURINE, GLUCOSEU, HGBUR, BILIRUBINUR, KETONESUR, PROTEINUR, UROBILINOGEN, NITRITE, LEUKOCYTESUR in the last 72 hours.  Invalid input(s): APPERANCEUR     Imaging: No results found.   Medications:    sodium chloride     sodium chloride      carvedilol  25 mg Oral BID WC   Chlorhexidine Gluconate Cloth  6 each Topical  Q0600   Chlorhexidine Gluconate  Cloth  6 each Topical Q0600   cloNIDine  0.3 mg Transdermal Weekly   feeding supplement (NEPRO CARB STEADY)  237 mL Oral TID BM   hydroxychloroquine  400 mg Oral Daily   isosorbide-hydrALAZINE  1 tablet Oral TID   lacosamide  150 mg Oral BID   methylPREDNISolone (SOLU-MEDROL) injection  80 mg Intravenous Q24H   multivitamin  1 tablet Oral QHS   pantoprazole  40 mg Oral Daily   pravastatin  20 mg Oral Daily   sacubitril-valsartan  1 tablet Oral BID   spironolactone  12.5 mg Oral Daily   sodium chloride, sodium chloride, acetaminophen, alteplase, bismuth subsalicylate, docusate sodium, heparin, hydrALAZINE, labetalol, levalbuterol, lidocaine (PF), lidocaine-prilocaine, LORazepam, ondansetron (ZOFRAN) IV, ondansetron (ZOFRAN) IV, oxyCODONE, oxyCODONE, pentafluoroprop-tetrafluoroeth, polyethylene glycol  Assessment/ Plan:  20 y.o. 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     1.  Renal-patient admitted with AKI on CKD. Acute kidney injury/chronic kidney disease stage IV/class IV lupus nephritis/proteinuria.   Patient has been actively followed by [facility] nephrology.  Patient in the past has been treated with Cytoxan.  Because of her given her young age it was decided that further Cytoxan treatment should be avoided.  Acute kidney injury now could be secondary to progressive lupus nephritis. -Patient had  peritoneal dialysis catheter placement done yesterday. Patient had hemodialysis treatment done yesterday-August 27, 2021 No need for renal placement therapy today Patient as dictated above has been  followed with [facility] nephrology and this case has been discussed with the nephrology during this admission We will hold off on renal biopsy at this time.  2.  Hyperkalemia.   Patient was earlier hyperkalemic Now better  3.  Anemia of chronic kidney disease/hemolytic anemia in the setting of lupus nephritis.   Patient hemoglobin was 5.9 earlier now stable at 7.8. We will consider adding Epogen next week.  4.  Secondary hyperparathyroidism.   Hyperphosphatemia Patient phosphorus was mildly high at 7.4 Dialysis has helped Patient phosphorus now much betterContinue to periodically monitor serum phosphorus.   Plan No need for renal replacement therapy today    LOS: 7 Laiana Fratus s [facility] 12/3/20228:28 AM

## 2021-08-28 NOTE — Plan of Care (Signed)

## 2021-08-30 ENCOUNTER — Encounter: Payer: Self-pay | Admitting: Surgery

## 2021-08-30 LAB — BLOOD GAS, ARTERIAL
Acid-base deficit: 6.1 mmol/L — ABNORMAL HIGH (ref 0.0–2.0)
Bicarbonate: 17.7 mmol/L — ABNORMAL LOW (ref 20.0–28.0)
Delivery systems: POSITIVE
Expiratory PAP: 8
FIO2: 45
Inspiratory PAP: 18
O2 Saturation: 99.1 %
Patient temperature: 37
pCO2 arterial: 28 mmHg — ABNORMAL LOW (ref 32.0–48.0)
pH, Arterial: 7.41 (ref 7.350–7.450)
pO2, Arterial: 137 mmHg — ABNORMAL HIGH (ref 83.0–108.0)

## 2021-08-30 NOTE — Anesthesia Postprocedure Evaluation (Signed)
Anesthesia Post Note  Patient: Melinda Steele  Procedure(s) Performed: LAPAROSCOPIC INSERTION CONTINUOUS AMBULATORY PERITONEAL DIALYSIS  (CAPD) CATHETER  Patient location during evaluation: PACU Anesthesia Type: General Level of consciousness: awake and alert Pain management: pain level controlled Vital Signs Assessment: post-procedure vital signs reviewed and stable Respiratory status: spontaneous breathing, nonlabored ventilation, respiratory function stable and patient connected to nasal cannula oxygen Cardiovascular status: blood pressure returned to baseline and stable Postop Assessment: no apparent nausea or vomiting Anesthetic complications: no   No notable events documented.   Last Vitals:  Vitals:   08/28/21 0800 08/28/21 0844  BP: 123/83 (!) 146/105  Pulse: 92 91  Resp: 20 16  Temp: 37 C 36.9 C  SpO2: 100% 98%    Last Pain:  Vitals:   08/28/21 0844  TempSrc: Axillary  PainSc:                  Arita Miss

## 2021-09-06 DIAGNOSIS — N2581 Secondary hyperparathyroidism of renal origin: Secondary | ICD-10-CM | POA: Insufficient documentation

## 2021-10-07 DIAGNOSIS — E440 Moderate protein-calorie malnutrition: Secondary | ICD-10-CM | POA: Insufficient documentation

## 2021-12-16 DIAGNOSIS — I5020 Unspecified systolic (congestive) heart failure: Secondary | ICD-10-CM | POA: Insufficient documentation

## 2022-07-08 ENCOUNTER — Emergency Department
Admission: EM | Admit: 2022-07-08 | Discharge: 2022-07-08 | Disposition: A | Discharge status: Home / Self Care | Payer: Medicaid Other | Attending: Emergency Medicine | Admitting: Emergency Medicine

## 2022-07-08 ENCOUNTER — Emergency Department: Payer: Medicaid Other

## 2022-07-08 ENCOUNTER — Encounter: Payer: Self-pay | Admitting: Emergency Medicine

## 2022-07-08 DIAGNOSIS — I509 Heart failure, unspecified: Secondary | ICD-10-CM | POA: Diagnosis not present

## 2022-07-08 DIAGNOSIS — L03211 Cellulitis of face: Secondary | ICD-10-CM | POA: Diagnosis not present

## 2022-07-08 DIAGNOSIS — N186 End stage renal disease: Secondary | ICD-10-CM | POA: Insufficient documentation

## 2022-07-08 DIAGNOSIS — Z79899 Other long term (current) drug therapy: Secondary | ICD-10-CM | POA: Insufficient documentation

## 2022-07-08 DIAGNOSIS — R569 Unspecified convulsions: Secondary | ICD-10-CM

## 2022-07-08 DIAGNOSIS — H01001 Unspecified blepharitis right upper eyelid: Secondary | ICD-10-CM | POA: Diagnosis not present

## 2022-07-08 LAB — CBC WITH DIFFERENTIAL/PLATELET
Abs Immature Granulocytes: 0.05 10*3/uL (ref 0.00–0.07)
Basophils Absolute: 0 10*3/uL (ref 0.0–0.1)
Basophils Relative: 0 %
Eosinophils Absolute: 0 10*3/uL (ref 0.0–0.5)
Eosinophils Relative: 0 %
HCT: 30.4 % — ABNORMAL LOW (ref 36.0–46.0)
Hemoglobin: 9.4 g/dL — ABNORMAL LOW (ref 12.0–15.0)
Immature Granulocytes: 2 %
Lymphocytes Relative: 17 %
Lymphs Abs: 0.6 10*3/uL — ABNORMAL LOW (ref 0.7–4.0)
MCH: 27.1 pg (ref 26.0–34.0)
MCHC: 30.9 g/dL (ref 30.0–36.0)
MCV: 87.6 fL (ref 80.0–100.0)
Monocytes Absolute: 0.3 10*3/uL (ref 0.1–1.0)
Monocytes Relative: 8 %
Neutro Abs: 2.4 10*3/uL (ref 1.7–7.7)
Neutrophils Relative %: 73 %
Platelets: 173 10*3/uL (ref 150–400)
RBC: 3.47 MIL/uL — ABNORMAL LOW (ref 3.87–5.11)
RDW: 12.9 % (ref 11.5–15.5)
WBC: 3.4 10*3/uL — ABNORMAL LOW (ref 4.0–10.5)
nRBC: 0 % (ref 0.0–0.2)

## 2022-07-08 LAB — COMPREHENSIVE METABOLIC PANEL
ALT: 16 U/L (ref 0–44)
AST: 22 U/L (ref 15–41)
Albumin: 2.6 g/dL — ABNORMAL LOW (ref 3.5–5.0)
Alkaline Phosphatase: 93 U/L (ref 38–126)
Anion gap: 12 (ref 5–15)
BUN: 83 mg/dL — ABNORMAL HIGH (ref 6–20)
CO2: 26 mmol/L (ref 22–32)
Calcium: 7.2 mg/dL — ABNORMAL LOW (ref 8.9–10.3)
Chloride: 101 mmol/L (ref 98–111)
Creatinine, Ser: 14.6 mg/dL — ABNORMAL HIGH (ref 0.44–1.00)
GFR, Estimated: 3 mL/min — ABNORMAL LOW (ref >60)
Glucose, Bld: 87 mg/dL (ref 70–99)
Potassium: 5.5 mmol/L — ABNORMAL HIGH (ref 3.5–5.1)
Sodium: 139 mmol/L (ref 135–145)
Total Bilirubin: 0.5 mg/dL (ref 0.3–1.2)
Total Protein: 6.1 g/dL — ABNORMAL LOW (ref 6.5–8.1)

## 2022-07-08 LAB — HCG, QUANTITATIVE, PREGNANCY: hCG, Beta Chain, Quant, S: 2 m[IU]/mL (ref <5)

## 2022-07-08 MED ORDER — BUTALBITAL-APAP-CAFFEINE 50-325-40 MG PO TABS
2.0000 | ORAL_TABLET | Freq: Once | ORAL | Status: AC
  Administered 2022-07-08: 2 via ORAL
  Filled 2022-07-08: qty 2

## 2022-07-08 MED ORDER — SODIUM CHLORIDE 0.9 % IV BOLUS: 1000.0000 mL | Freq: Once | INTRAVENOUS | Status: DC

## 2022-07-08 MED ORDER — CEPHALEXIN 250 MG PO CAPS: 250.0000 mg | ORAL_CAPSULE | Freq: Every day | ORAL | 0 refills | Status: AC

## 2022-07-08 MED ORDER — SODIUM CHLORIDE 0.9 % IV SOLN
1.0000 g | Freq: Once | INTRAVENOUS | Status: AC
  Administered 2022-07-08: 1 g via INTRAVENOUS
  Filled 2022-07-08: qty 10

## 2022-07-08 MED ORDER — SODIUM ZIRCONIUM CYCLOSILICATE 10 G PO PACK
10.0000 g | PACK | Freq: Once | ORAL | Status: AC
  Administered 2022-07-08: 10 g via ORAL
  Filled 2022-07-08: qty 1

## 2022-07-08 MED ORDER — OXYCODONE HCL 5 MG PO TABS: 5.0000 mg | ORAL_TABLET | Freq: Two times a day (BID) | ORAL | 0 refills | Status: AC | PRN

## 2022-07-08 NOTE — Discharge Instructions (Addendum)
Please take a double dose of your lacosamide/Vimpat this morning, and then continue normal dosing after this.  You are being discharged with a prescription for antibiotics to take once daily for 4 days starting tomorrow/Saturday.  The antibiotics in the IV will last about 24 hours.   Use Tylenol for headaches.  I did send a prescription for oxycodone for more severe/breakthrough pain.  Return to the ED with any worsening symptoms

## 2022-07-08 NOTE — ED Triage Notes (Signed)
Pt to ED via EMS from home c/o seizure tonight.  EMS reports family heard screaming and found pt having full body seizure for a couple minutes.  Last seizure was November 1 year ago.  Per EMS pt also went to clinic yesterday for swollen right eyelid and low hemoglobin, patient does PD.  EMS vitals 98.7 temp, 147/108, 120 CBG.  Pt A&Ox4, chest rise even and unlabored, MD at bedside, siderails padded.

## 2022-07-08 NOTE — ED Provider Notes (Signed)
[facility] Provider Note    Event Date/Time   First MD Initiated Contact with Patient 07/08/22 0422     (approximate)   History   Seizures   HPI  Melinda Steele is a 21 y.o. female who presents to the ED for evaluation of Seizures   I review [facility] transplant medicine evaluation for possible kidney transplantation.  History of ESRD secondary to lupus nephritis.  CHF with reduced ejection fraction. Seizure disorder.   Patient presents to the ED with her mother after witnessed seizure.  Mom reports witnessing a seizure that lasted minutes before self resolving, generalized tonic-clonic and tightening bilaterally.  Since arrival to the ED they report that she is back to her baseline.  Prior to this, patient's been feeling some generalized weakness over the past 1 week.  They report that for the past day or so her right eyelid has been red and inflamed.  No fevers or vision changes.  They followed up with nephrology earlier today (10/12) routinely and noted to have mildly high potassium and they adjusted her PD regimen moving forward to address this.  She has been compliant with her antiepileptics without any adjustments to her regimen or missed doses recently.  Physical Exam   Triage Vital Signs: ED Triage Vitals [07/08/22 0358]  Enc Vitals Group     BP (!) 137/94     Pulse Rate 95     Resp 18     Temp 98.4 F (36.9 C)     Temp Source Oral     SpO2 98 %     Weight 126 lb 4.8 oz (57.3 kg)     Height '5\' 6"'$  (1.676 m)     Head Circumference      Peak Flow      Pain Score 0     Pain Loc      Pain Edu?      Excl. in [place]?     Most recent vital signs: Vitals:   07/08/22 0600 07/08/22 0800  BP: (!) 139/103 137/89  Pulse: 91 89  Resp: 18 17  Temp:  98.4 F (36.9 C)  SpO2: 100% 100%    General: Awake, no distress. Looks systemically well.  Erythematous and slightly swollen right eyelid with some erythematous streaking posterior to this  towards her temple.  No signs of EOM entrapment or trauma beyond this. CV:  Good peripheral perfusion.  Resp:  Normal effort.  Abd:  No distention.  MSK:  No deformity noted.  Neuro:  No focal deficits appreciated. Other:     ED Results / Procedures / Treatments   Labs (all labs ordered are listed, but only abnormal results are displayed) Labs Reviewed  CBC WITH DIFFERENTIAL/PLATELET - Abnormal; Notable for the following components:      Result Value   WBC 3.4 (*)    RBC 3.47 (*)    Hemoglobin 9.4 (*)    HCT 30.4 (*)    Lymphs Abs 0.6 (*)    All other components within normal limits  COMPREHENSIVE METABOLIC PANEL - Abnormal; Notable for the following components:   Potassium 5.5 (*)    BUN 83 (*)    Creatinine, Ser 14.60 (*)    Calcium 7.2 (*)    Total Protein 6.1 (*)    Albumin 2.6 (*)    GFR, Estimated 3 (*)    All other components within normal limits  HCG, QUANTITATIVE, PREGNANCY  URINALYSIS, ROUTINE W REFLEX MICROSCOPIC  LACOSAMIDE  EKG   RADIOLOGY CT head interpreted by me without evidence of acute intracranial pathology  Official radiology report(s): CT HEAD WO CONTRAST (5MM)  Result Date: 07/08/2022 CLINICAL DATA:  Seizure, new-onset, no history of trauma EXAM: CT HEAD WITHOUT CONTRAST TECHNIQUE: Contiguous axial images were obtained from the base of the skull through the vertex without intravenous contrast. RADIATION DOSE REDUCTION: This exam was performed according to the departmental dose-optimization program which includes automated exposure control, adjustment of the mA and/or kV according to patient size and/or use of iterative reconstruction technique. COMPARISON:  CT head August 21, 2021. FINDINGS: Brain: No evidence of acute infarction, hemorrhage, hydrocephalus, extra-axial collection or mass lesion/mass effect. Vascular: No hyperdense vessel identified. Skull: No acute fracture. Sinuses/Orbits: Clear sinuses.  No acute orbital findings. Other: No  mastoid effusions. IMPRESSION: No evidence of acute intracranial abnormality. Electronically Signed   By: Margaretha Sheffield M.D.   On: 07/08/2022 06:36    PROCEDURES and INTERVENTIONS:  Procedures  Medications  sodium chloride 0.9 % bolus 1,000 mL (1,000 mLs Intravenous Not Given 07/08/22 0610)  butalbital-acetaminophen-caffeine (FIORICET) 50-325-40 MG per tablet 2 tablet (2 tablets Oral Given 07/08/22 0610)  cefTRIAXone (ROCEPHIN) 1 g in sodium chloride 0.9 % 100 mL IVPB (0 g Intravenous Stopped 07/08/22 0723)  sodium zirconium cyclosilicate (LOKELMA) packet 10 g (10 g Oral Given 07/08/22 0743)     IMPRESSION / MDM / ASSESSMENT AND PLAN / ED COURSE  I reviewed the triage vital signs and the nursing notes.  Differential diagnosis includes, but is not limited to, seizure, polysubstance abuse, sepsis, blepharitis, cellulitis, intracranial hemorrhage  {Patient presents with symptoms of an acute illness or injury that is potentially life-threatening.  21 year old female with ESRD from lupus nephritis presents after a witnessed seizure despite compliance with her antiepileptics.  Possibly precipitated by facial cellulitis that is suitable for trial of outpatient management with the initiation of antibiotics.  She looks systemically well and does not have any recurrence of seizures or signs of status epilepticus.  CT head is reassuring without deep extension or abscess or retrobulbar pathology.  CBC with no leukocytosis or signs of sepsis.  Normocytic anemia is noted and likely chronic.  Metabolically, she does have hyperkalemia to 5.5 and when talking with patient and her mother reports that she had the same number yesterday and was provided an adjusted PT regimen to address this, but has been unable to enact this yet because of her ED visit today.  I considered admission for this patient and but I think a trial of outpatient management would be reasonable with initiation of antibiotics and doing her  PD regimen as described.  Did provide a dose of Lokelma here.  No other signs of underlying pathology to precipitate breakthrough seizure beyond the facial reddening and cellulitis.  Discharged with Keflex and return precautions.     FINAL CLINICAL IMPRESSION(S) / ED DIAGNOSES   Final diagnoses:  Seizure ([place])  Blepharitis of right upper eyelid, unspecified type  Facial cellulitis     Rx / DC Orders   ED Discharge Orders          Ordered    cephALEXin (KEFLEX) 250 MG capsule  Daily        07/08/22 0733    oxyCODONE (ROXICODONE) 5 MG immediate release tablet  Every 12 hours PRN        07/08/22 0733             Note:  This document was prepared using Dragon voice  recognition software and may include unintentional dictation errors.   Vladimir Crofts, MD 07/08/22 [phone]

## 2022-07-08 NOTE — ED Notes (Signed)
E-signature not working at this time. Pt verbalized understanding of D/C instructions, prescriptions and follow up care with no further questions at this time. Pt in NAD and ambulatory at time of D/C.  

## 2022-07-12 LAB — LACOSAMIDE: Lacosamide: 2.7 ug/mL — ABNORMAL LOW (ref 5.0–10.0)

## 2022-08-21 DIAGNOSIS — E611 Iron deficiency: Secondary | ICD-10-CM | POA: Insufficient documentation

## 2022-08-31 ENCOUNTER — Other Ambulatory Visit
Admission: RE | Admit: 2022-08-31 | Discharge: 2022-08-31 | Disposition: A | Discharge status: Home / Self Care | Payer: Medicaid Other | Source: Ambulatory Visit | Attending: Nephrology | Admitting: Nephrology

## 2022-08-31 ENCOUNTER — Telehealth: Payer: Self-pay | Admitting: Pharmacist

## 2022-08-31 DIAGNOSIS — K650 Generalized (acute) peritonitis: Secondary | ICD-10-CM | POA: Insufficient documentation

## 2022-08-31 LAB — VANCOMYCIN, TROUGH: Vancomycin Tr: 40 ug/mL (ref 15–20)

## 2022-08-31 NOTE — Progress Notes (Signed)
Pharmacy - Antimicrobial Stewardship  Lab called pharmacy with vancomycin level = 40 mcg/ml for this patient who is not admitted to hospital,  Getting vancomycin with peritoneal dialysis, appears being managed by Dr Trinda Pascal.  Given number to call the dialysis center by [facility] nephrology. Information given to them.   Doreene Eland, PharmD, BCPS, BCIDP Work Cell: [phone] 08/31/2022 4:34 PM

## 2023-01-11 DIAGNOSIS — F39 Unspecified mood [affective] disorder: Secondary | ICD-10-CM | POA: Insufficient documentation

## 2023-01-27 IMAGING — CT CT ABD-PELV W/O CM
2 of 4 series · 16 of 46 positions shown, 18 images · non-contrast
Comparison: Chest x-ray 08/21/2021

CLINICAL DATA: Abdominal distension Nausea/vomiting Abdominal pain,
acute, nonlocalized

EXAM:
CT ABDOMEN AND PELVIS WITHOUT CONTRAST
TECHNIQUE: Multidetector CT imaging of the abdomen and pelvis was performed
following the standard protocol without IV contrast.

[Series 2: routine abd/pel wo · axial · 0.72mm/px · z∈[-374,+41]mm · 13 of 89 slices shown, 15 images]
[im 4/89  soft-tissue]
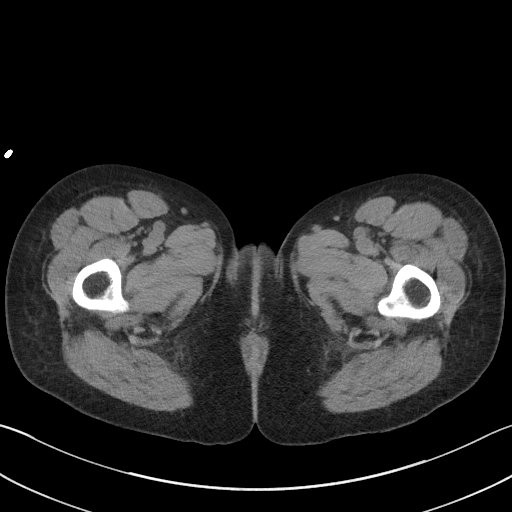
[im 4/89  bone]
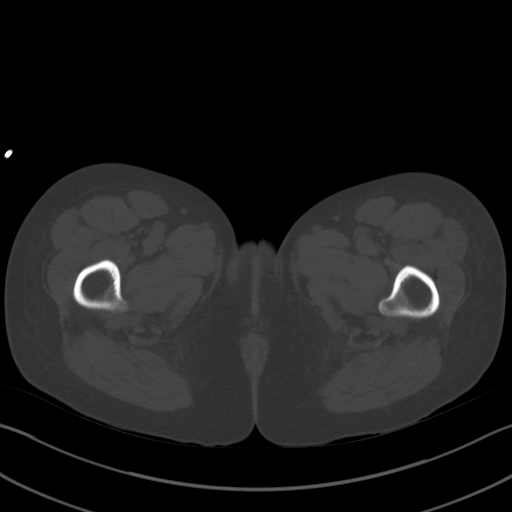
[im 12/89  soft-tissue]
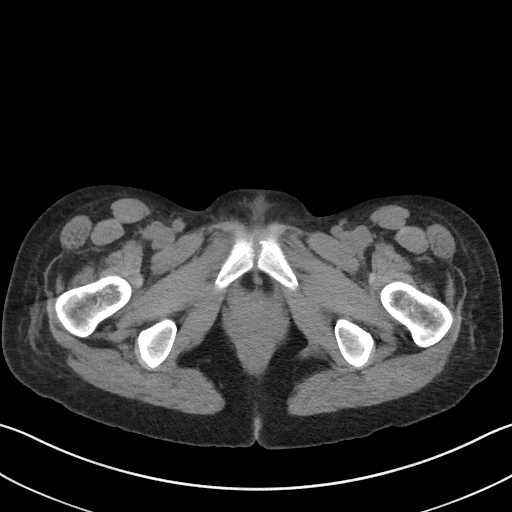
[im 19/89  soft-tissue]
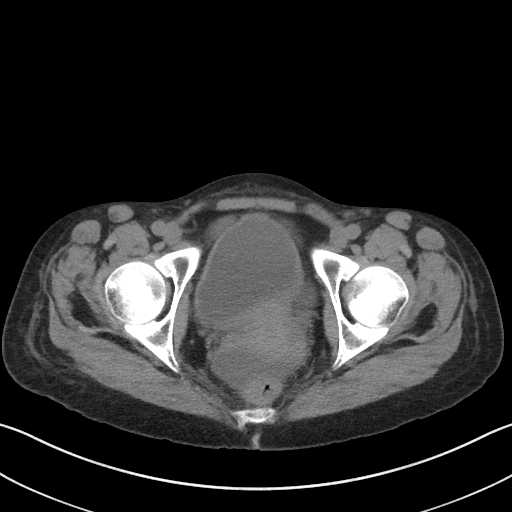
[im 26/89  soft-tissue]
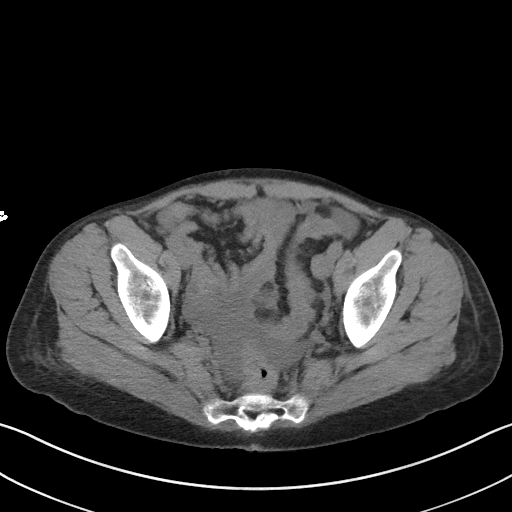
[im 30/89  soft-tissue]
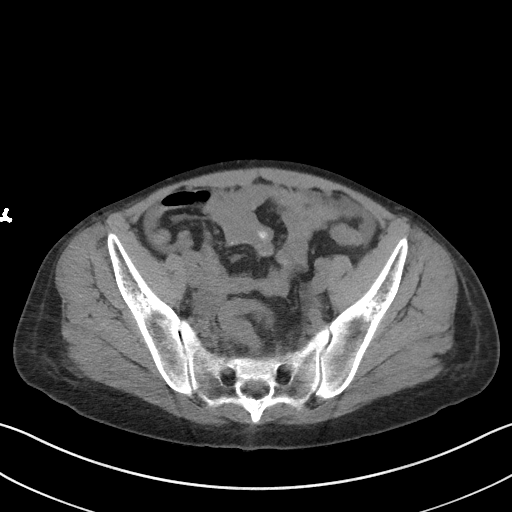
[im 37/89  soft-tissue]
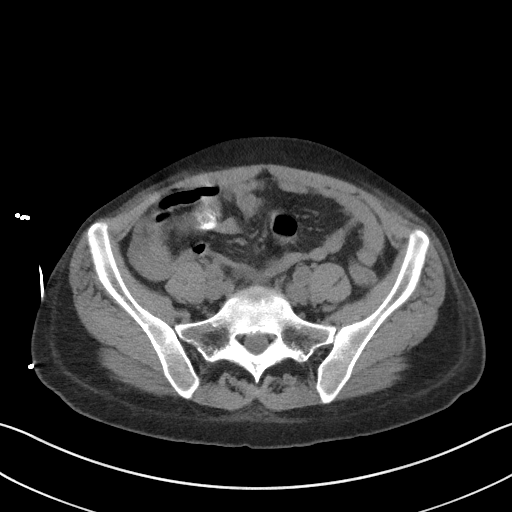
[im 45/89  soft-tissue]
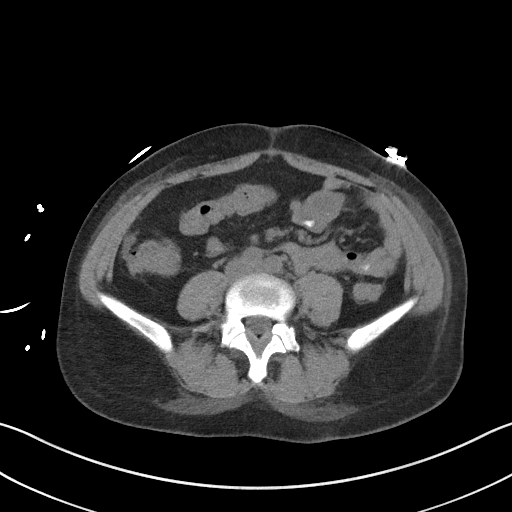
[im 52/89  soft-tissue]
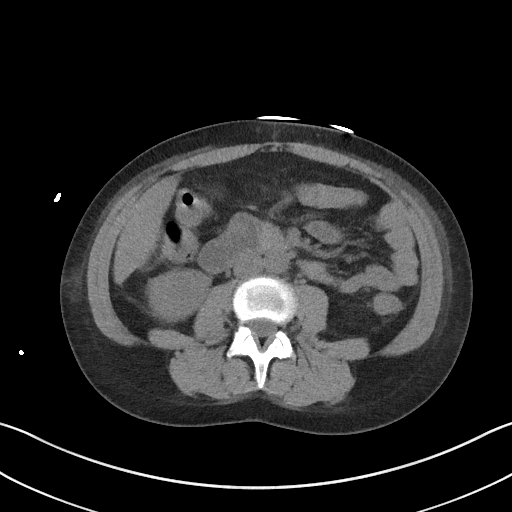
[im 59/89  soft-tissue]
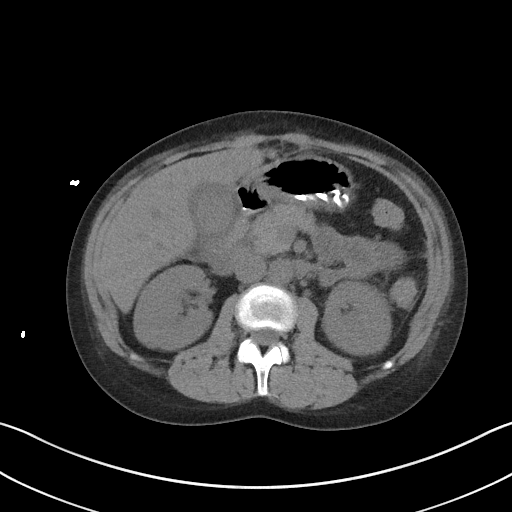
[im 59/89  bone]
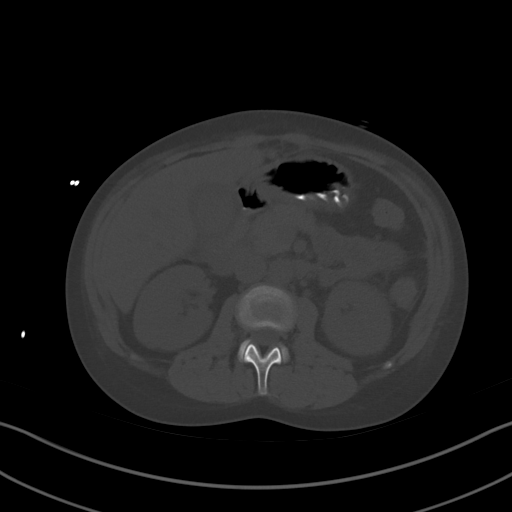
[im 63/89  soft-tissue]
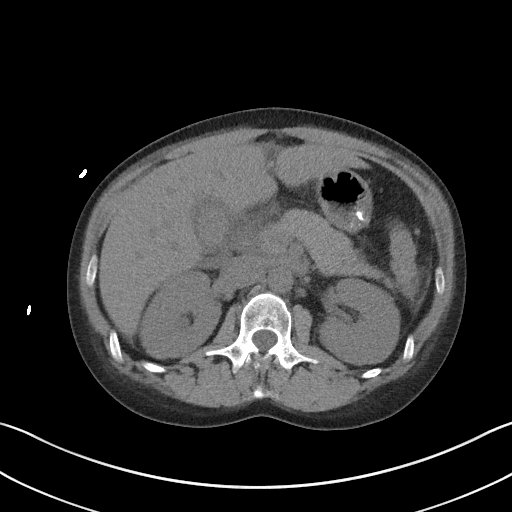
[im 70/89  soft-tissue]
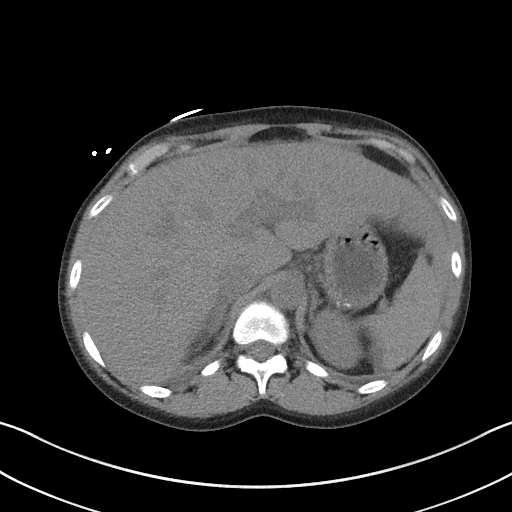
[im 78/89  soft-tissue]
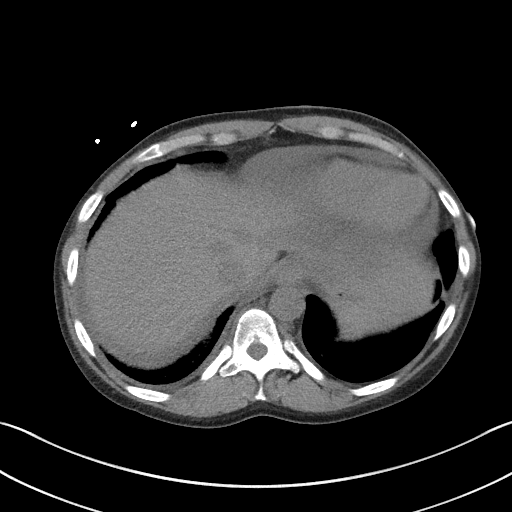
[im 85/89  soft-tissue]
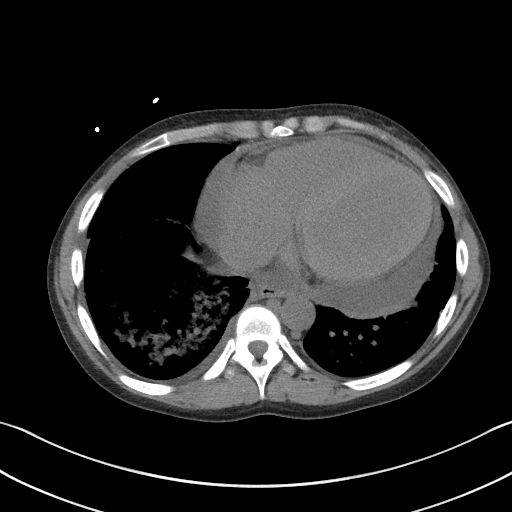

[Series 5: coronal st · coronal · 0.71mm/px · 3 of 79 slices shown]
[im 27/79  soft-tissue]
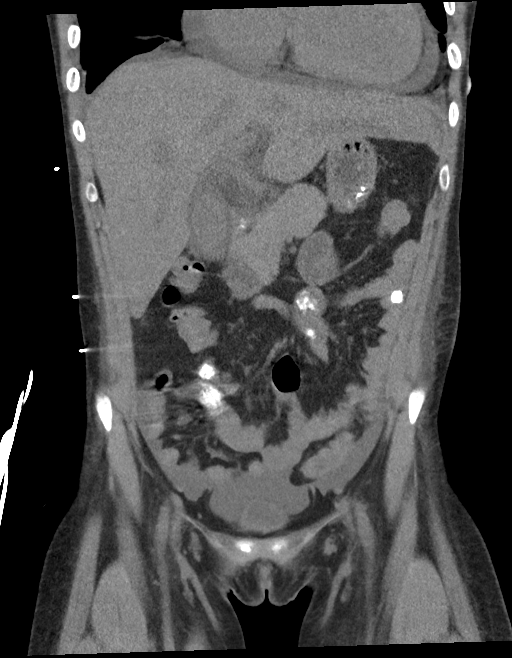
[im 35/79  soft-tissue]
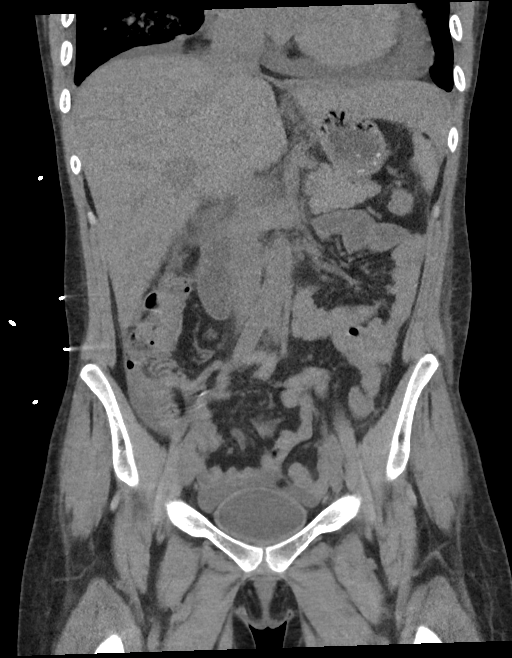
[im 44/79  soft-tissue]
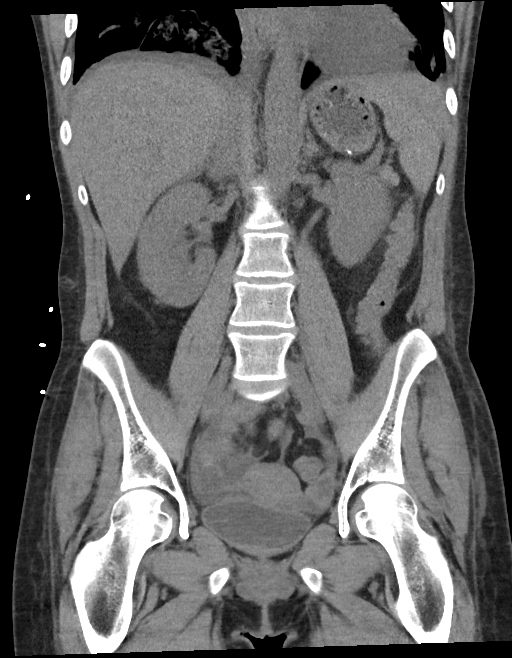

[16 of 46 positions shown; findings below may reference images not displayed]

FINDINGS: Lower chest: Marked peribronchovascular ground-glass airspace
opacities of the visualized lung. At least small volume pericardial
effusion partially visualized. Trace right pleural effusion.

Hepatobiliary: No focal liver abnormality. Likely question
gallbladder wall thickening with markedly limited evaluation on this
noncontrast study. No biliary dilatation. Query mild periportal
edema.

Pancreas: No focal lesion. Normal pancreatic contour. No surrounding
inflammatory changes. No main pancreatic ductal dilatation.

Spleen: Normal in size without focal abnormality.

Adrenals/Urinary Tract:

No adrenal nodule bilaterally.

No nephrolithiasis and no hydronephrosis. No definite
contour-deforming renal mass.

No ureterolithiasis or hydroureter.

The urinary bladder is unremarkable.

Stomach/Bowel: Stomach is within normal limits. No evidence of bowel
wall thickening or dilatation. Appendix appears normal.

Vascular/Lymphatic: No abdominal aorta or iliac aneurysm. No
abdominal, pelvic, or inguinal lymphadenopathy.

Reproductive: Uterus and bilateral adnexa are unremarkable.

Other: Trace free fluid within the abdomen and pelvis. No
intraperitoneal free gas. No organized fluid collection.

Musculoskeletal:

No abdominal wall hernia or abnormality.

No suspicious lytic or blastic osseous lesions. No acute displaced
fracture.
IMPRESSION: 1. Pulmonary findings suggestive of infection/inflammation versus
edema versus alveolar hemorrhage.
2. Partially visualized at least small volume pericardial effusion.
3. Trace right pleural effusion.
4. Likely gallbladder wall thickening with markedly limited
evaluation on this noncontrast study. Recommend right upper quadrant
ultrasound for further evaluation.
5. Trace simple free fluid within the abdomen and pelvis.

## 2023-01-28 IMAGING — US US ABDOMEN LIMITED
1 series · 13 of 25 positions shown · non-contrast
Comparison: Abdomen pelvis CT, dated August 22, 2021.

CLINICAL DATA: History of chronic renal disease with gallbladder
wall thickening seen on recent abdomen pelvis CT.

EXAM:
ULTRASOUND ABDOMEN LIMITED RIGHT UPPER QUADRANT

[Series 1001: abdomen us · 13 of 83 slices shown]
[im 1/83]
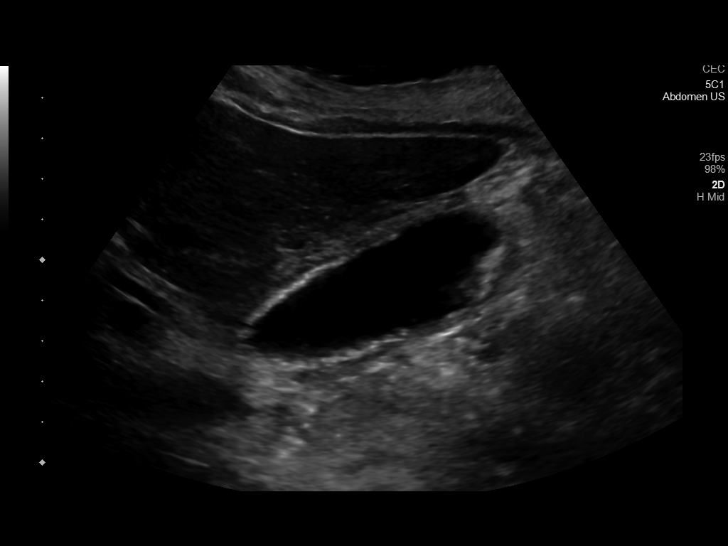
[im 7/83]
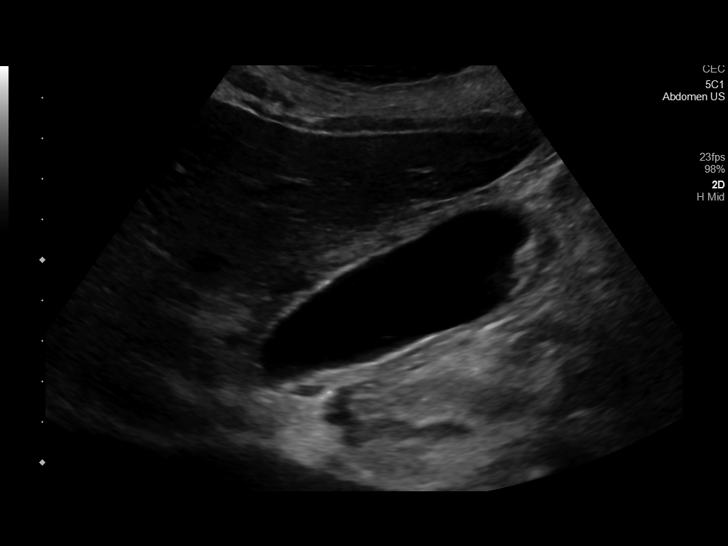
[im 14/83]
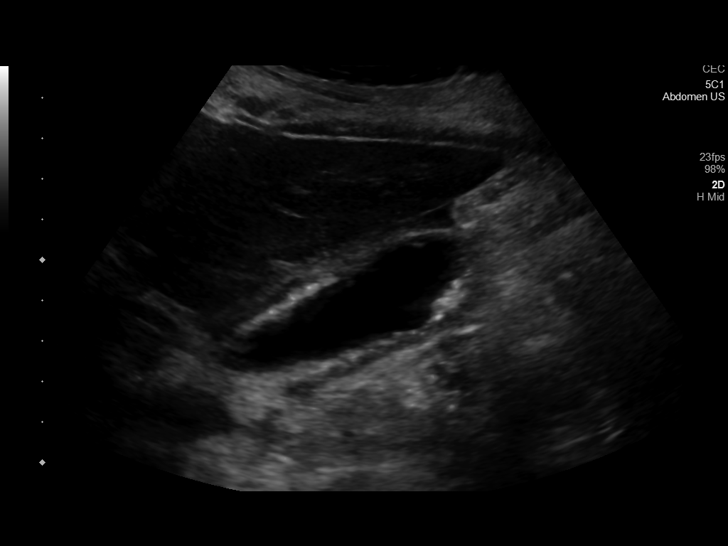
[im 21/83]
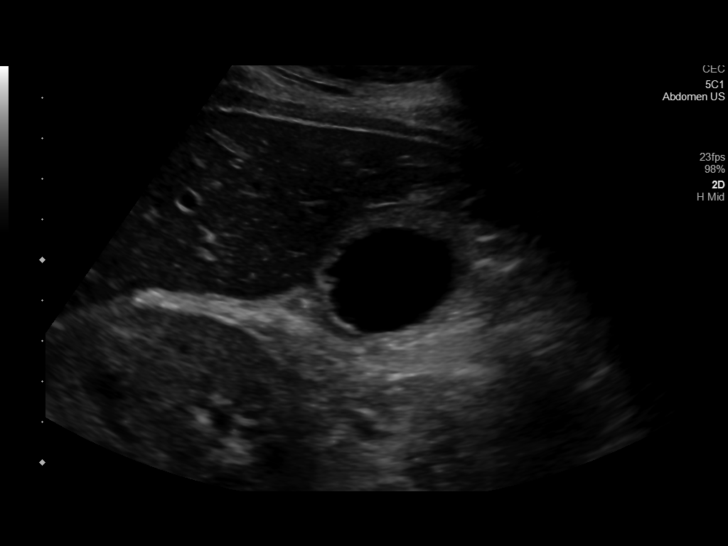
[im 28/83]
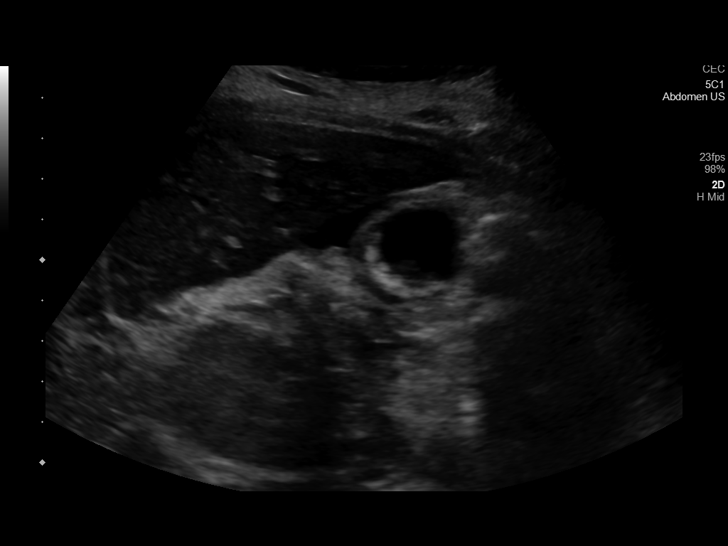
[im 35/83]
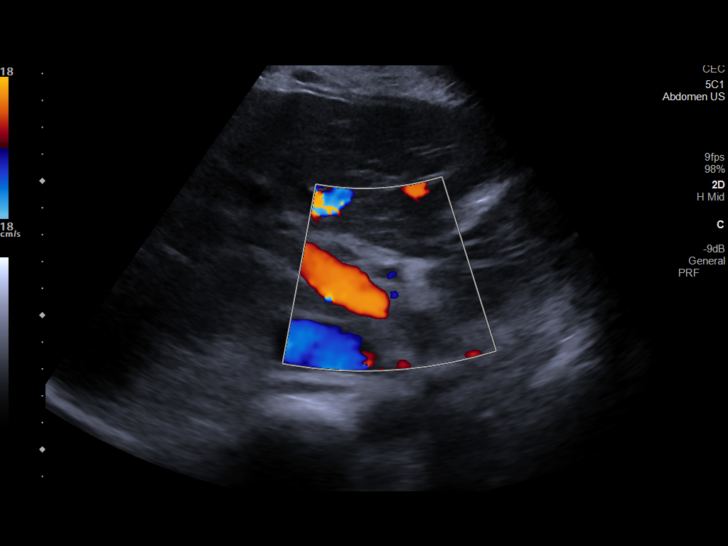
[im 42/83]
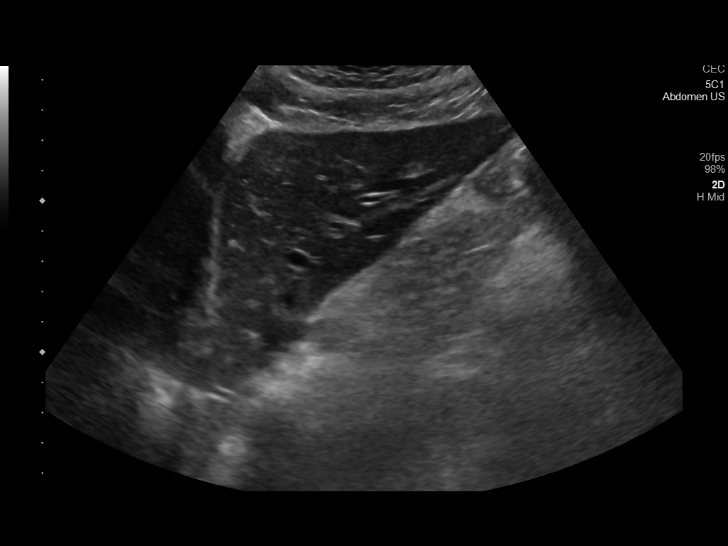
[im 48/83]
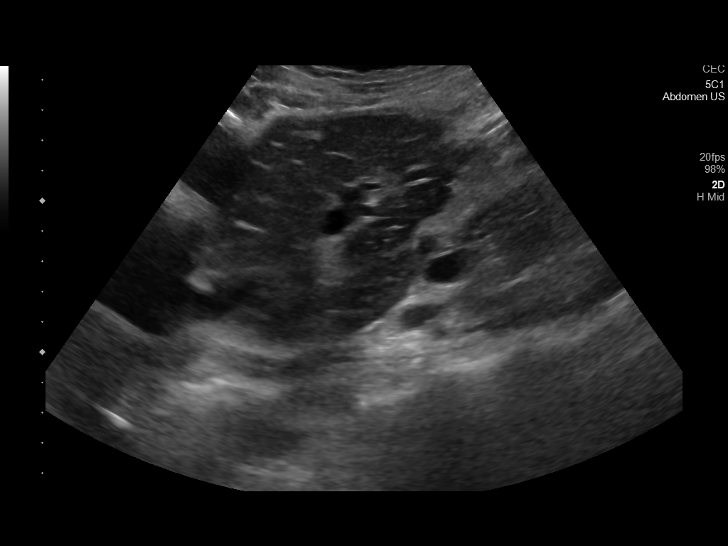
[im 55/83]
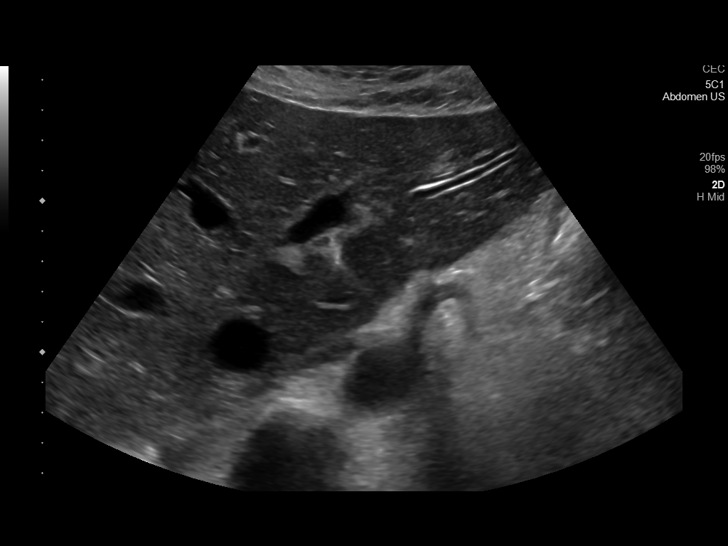
[im 62/83]
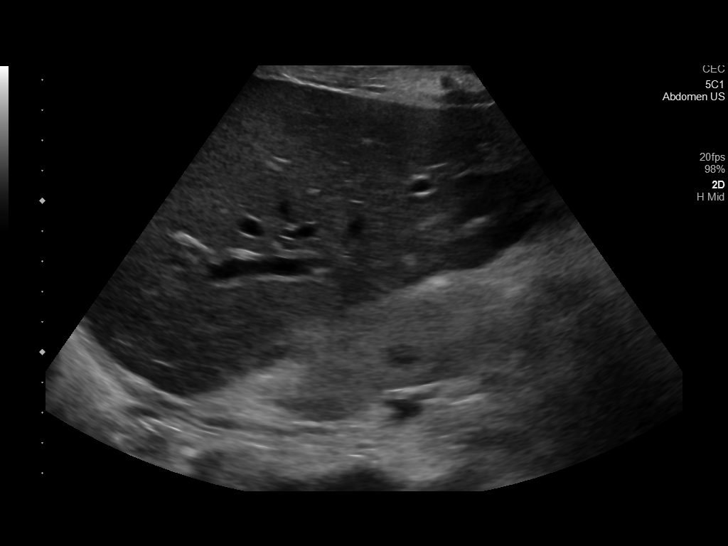
[im 69/83]
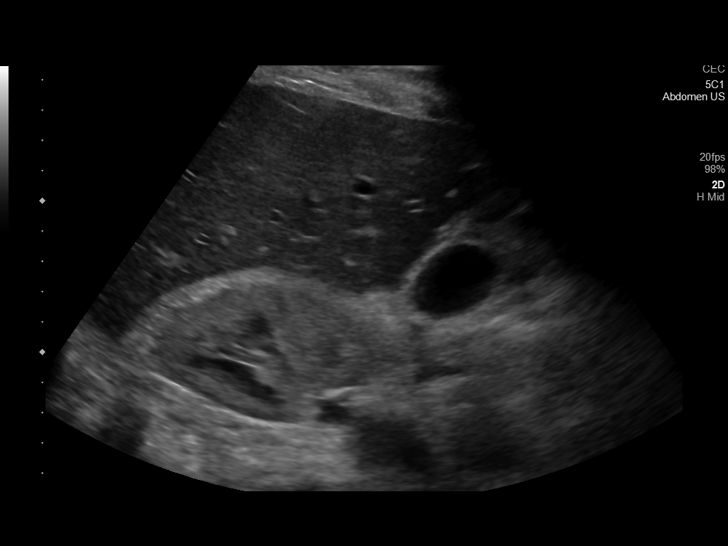
[im 76/83]
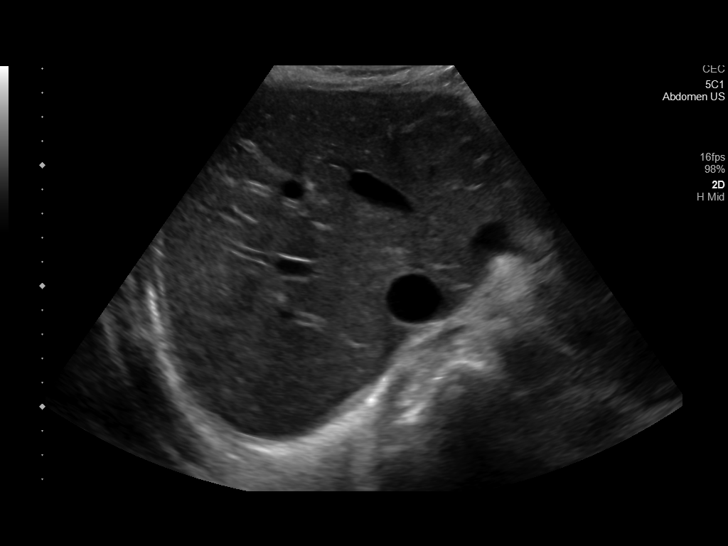
[im 83/83]
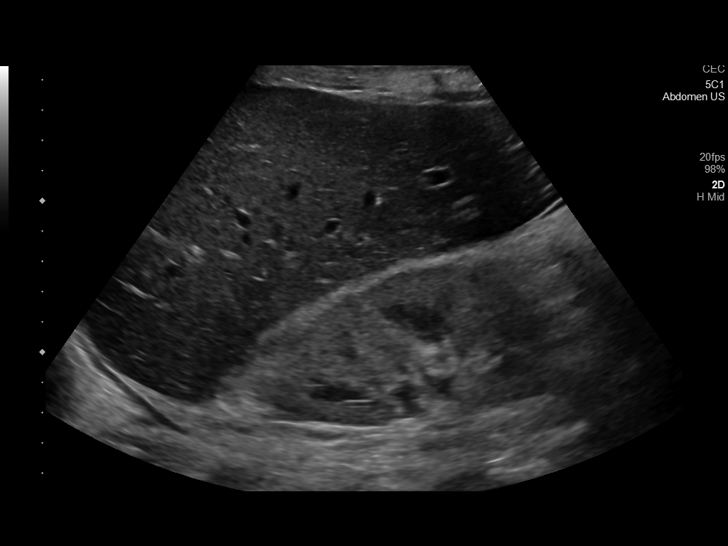

[13 of 25 positions shown; findings below may reference images not displayed]

FINDINGS: Gallbladder:

A thin, curvilinear nonshadowing echogenic area is seen along the
wall of the gallbladder fundus. No gallstones or wall thickening
visualized (2.8 mm). No sonographic Murphy sign noted by
sonographer.

Common bile duct:

Diameter: 4.2 mm

Liver:

No focal lesion identified. Within normal limits in parenchymal
echogenicity. Portal vein is patent on color Doppler imaging with
normal direction of blood flow towards the liver.

Other: Of incidental note is the presence a right-sided pleural
effusion and an echogenic right kidney.
IMPRESSION: 1. Thin, echogenic area along the wall of the gallbladder fundus
which may represent very mild gallbladder wall calcification.
2. No evidence of acute cholecystitis. Further evaluation with a
nuclear medicine hepatobiliary scan is recommended if this is of
clinical concern.
3. Small right pleural effusion.
4. Echogenic right kidney which may be secondary to medical renal
disease related to the patient's history of chronic renal disease.

## 2023-02-04 ENCOUNTER — Other Ambulatory Visit: Payer: Self-pay

## 2023-02-04 ENCOUNTER — Inpatient Hospital Stay
Admission: EM | Admit: 2023-02-04 | Discharge: 2023-02-09 | DRG: 919 | Discharge status: Against Medical Advice | Payer: Medicaid Other | Attending: Internal Medicine | Admitting: Internal Medicine

## 2023-02-04 ENCOUNTER — Emergency Department: Payer: Medicaid Other

## 2023-02-04 DIAGNOSIS — R1084 Generalized abdominal pain: Secondary | ICD-10-CM

## 2023-02-04 DIAGNOSIS — D638 Anemia in other chronic diseases classified elsewhere: Secondary | ICD-10-CM | POA: Diagnosis present

## 2023-02-04 DIAGNOSIS — M329 Systemic lupus erythematosus, unspecified: Secondary | ICD-10-CM | POA: Diagnosis present

## 2023-02-04 DIAGNOSIS — R81 Glycosuria: Secondary | ICD-10-CM | POA: Diagnosis present

## 2023-02-04 DIAGNOSIS — T361X5A Adverse effect of cephalosporins and other beta-lactam antibiotics, initial encounter: Secondary | ICD-10-CM | POA: Diagnosis not present

## 2023-02-04 DIAGNOSIS — R569 Unspecified convulsions: Secondary | ICD-10-CM | POA: Diagnosis present

## 2023-02-04 DIAGNOSIS — I5022 Chronic systolic (congestive) heart failure: Secondary | ICD-10-CM | POA: Diagnosis not present

## 2023-02-04 DIAGNOSIS — M3214 Glomerular disease in systemic lupus erythematosus: Secondary | ICD-10-CM | POA: Diagnosis present

## 2023-02-04 DIAGNOSIS — N186 End stage renal disease: Secondary | ICD-10-CM | POA: Diagnosis present

## 2023-02-04 DIAGNOSIS — E878 Other disorders of electrolyte and fluid balance, not elsewhere classified: Secondary | ICD-10-CM | POA: Diagnosis present

## 2023-02-04 DIAGNOSIS — B9689 Other specified bacterial agents as the cause of diseases classified elsewhere: Secondary | ICD-10-CM | POA: Diagnosis present

## 2023-02-04 DIAGNOSIS — K5900 Constipation, unspecified: Secondary | ICD-10-CM | POA: Diagnosis not present

## 2023-02-04 DIAGNOSIS — K658 Other peritonitis: Secondary | ICD-10-CM | POA: Diagnosis present

## 2023-02-04 DIAGNOSIS — Z83438 Family history of other disorder of lipoprotein metabolism and other lipidemia: Secondary | ICD-10-CM

## 2023-02-04 DIAGNOSIS — N2581 Secondary hyperparathyroidism of renal origin: Secondary | ICD-10-CM | POA: Diagnosis present

## 2023-02-04 DIAGNOSIS — E876 Hypokalemia: Secondary | ICD-10-CM | POA: Diagnosis present

## 2023-02-04 DIAGNOSIS — R319 Hematuria, unspecified: Secondary | ICD-10-CM | POA: Diagnosis present

## 2023-02-04 DIAGNOSIS — R109 Unspecified abdominal pain: Secondary | ICD-10-CM

## 2023-02-04 DIAGNOSIS — Z79899 Other long term (current) drug therapy: Secondary | ICD-10-CM

## 2023-02-04 DIAGNOSIS — I351 Nonrheumatic aortic (valve) insufficiency: Secondary | ICD-10-CM | POA: Diagnosis present

## 2023-02-04 DIAGNOSIS — Z5329 Procedure and treatment not carried out because of patient's decision for other reasons: Secondary | ICD-10-CM | POA: Diagnosis not present

## 2023-02-04 DIAGNOSIS — Z888 Allergy status to other drugs, medicaments and biological substances status: Secondary | ICD-10-CM

## 2023-02-04 DIAGNOSIS — T8571XA Infection and inflammatory reaction due to peritoneal dialysis catheter, initial encounter: Principal | ICD-10-CM | POA: Diagnosis present

## 2023-02-04 DIAGNOSIS — Z992 Dependence on renal dialysis: Secondary | ICD-10-CM

## 2023-02-04 DIAGNOSIS — D631 Anemia in chronic kidney disease: Secondary | ICD-10-CM | POA: Diagnosis present

## 2023-02-04 DIAGNOSIS — R809 Proteinuria, unspecified: Secondary | ICD-10-CM | POA: Diagnosis present

## 2023-02-04 DIAGNOSIS — Z7952 Long term (current) use of systemic steroids: Secondary | ICD-10-CM

## 2023-02-04 DIAGNOSIS — G251 Drug-induced tremor: Secondary | ICD-10-CM | POA: Diagnosis not present

## 2023-02-04 DIAGNOSIS — Y841 Kidney dialysis as the cause of abnormal reaction of the patient, or of later complication, without mention of misadventure at the time of the procedure: Secondary | ICD-10-CM | POA: Diagnosis present

## 2023-02-04 LAB — COMPREHENSIVE METABOLIC PANEL
ALT: 14 U/L (ref 0–44)
AST: 20 U/L (ref 15–41)
Albumin: 1.9 g/dL — ABNORMAL LOW (ref 3.5–5.0)
Alkaline Phosphatase: 58 U/L (ref 38–126)
Anion gap: 9 (ref 5–15)
BUN: 34 mg/dL — ABNORMAL HIGH (ref 6–20)
CO2: 28 mmol/L (ref 22–32)
Calcium: 7.7 mg/dL — ABNORMAL LOW (ref 8.9–10.3)
Chloride: 99 mmol/L (ref 98–111)
Creatinine, Ser: 12.64 mg/dL — ABNORMAL HIGH (ref 0.44–1.00)
GFR, Estimated: 4 mL/min — ABNORMAL LOW (ref >60)
Glucose, Bld: 94 mg/dL (ref 70–99)
Potassium: 2.6 mmol/L — CL (ref 3.5–5.1)
Sodium: 136 mmol/L (ref 135–145)
Total Bilirubin: 0.6 mg/dL (ref 0.3–1.2)
Total Protein: 5.1 g/dL — ABNORMAL LOW (ref 6.5–8.1)

## 2023-02-04 LAB — CBC WITH DIFFERENTIAL/PLATELET
Abs Immature Granulocytes: 0.19 10*3/uL — ABNORMAL HIGH (ref 0.00–0.07)
Basophils Absolute: 0 10*3/uL (ref 0.0–0.1)
Basophils Relative: 0 %
Eosinophils Absolute: 0 10*3/uL (ref 0.0–0.5)
Eosinophils Relative: 0 %
HCT: 23.3 % — ABNORMAL LOW (ref 36.0–46.0)
Hemoglobin: 7.3 g/dL — ABNORMAL LOW (ref 12.0–15.0)
Immature Granulocytes: 4 %
Lymphocytes Relative: 17 %
Lymphs Abs: 0.8 10*3/uL (ref 0.7–4.0)
MCH: 26.1 pg (ref 26.0–34.0)
MCHC: 31.3 g/dL (ref 30.0–36.0)
MCV: 83.2 fL (ref 80.0–100.0)
Monocytes Absolute: 0.2 10*3/uL (ref 0.1–1.0)
Monocytes Relative: 3 %
Neutro Abs: 3.5 10*3/uL (ref 1.7–7.7)
Neutrophils Relative %: 76 %
Platelets: 203 10*3/uL (ref 150–400)
RBC: 2.8 MIL/uL — ABNORMAL LOW (ref 3.87–5.11)
RDW: 15.5 % (ref 11.5–15.5)
WBC: 4.6 10*3/uL (ref 4.0–10.5)
nRBC: 0.4 % — ABNORMAL HIGH (ref 0.0–0.2)

## 2023-02-04 LAB — BASIC METABOLIC PANEL
Anion gap: 8 (ref 5–15)
BUN: 38 mg/dL — ABNORMAL HIGH (ref 6–20)
CO2: 27 mmol/L (ref 22–32)
Calcium: 7.2 mg/dL — ABNORMAL LOW (ref 8.9–10.3)
Chloride: 102 mmol/L (ref 98–111)
Creatinine, Ser: 13 mg/dL — ABNORMAL HIGH (ref 0.44–1.00)
GFR, Estimated: 4 mL/min — ABNORMAL LOW (ref >60)
Glucose, Bld: 90 mg/dL (ref 70–99)
Potassium: 3.5 mmol/L (ref 3.5–5.1)
Sodium: 137 mmol/L (ref 135–145)

## 2023-02-04 LAB — BODY FLUID CELL COUNT WITH DIFFERENTIAL
Eos, Fluid: 0 %
Lymphs, Fluid: 1 %
Monocyte-Macrophage-Serous Fluid: 5 %
Neutrophil Count, Fluid: 94 %
Total Nucleated Cell Count, Fluid: 1620 cu mm

## 2023-02-04 LAB — URINALYSIS, ROUTINE W REFLEX MICROSCOPIC
APPearance: UNDETERMINED — AB
Bacteria, UA: UNDETERMINED — AB
Bilirubin Urine: UNDETERMINED — AB
Color, Urine: UNDETERMINED — AB
Glucose, UA: UNDETERMINED mg/dL — AB
Hgb urine dipstick: UNDETERMINED — AB
Ketones, ur: UNDETERMINED mg/dL — AB
Leukocytes,Ua: UNDETERMINED — AB
Nitrite: UNDETERMINED — AB
Protein, ur: UNDETERMINED mg/dL — AB
RBC / HPF: UNDETERMINED RBC/hpf (ref 0–5)
Specific Gravity, Urine: UNDETERMINED (ref 1.005–1.030)
Squamous Epithelial / HPF: UNDETERMINED /HPF (ref 0–5)
WBC Clumps: UNDETERMINED
WBC, UA: UNDETERMINED WBC/hpf (ref 0–5)
pH: UNDETERMINED (ref 5.0–8.0)

## 2023-02-04 LAB — PROTEIN, PLEURAL OR PERITONEAL FLUID: Total protein, fluid: <3 g/dL

## 2023-02-04 LAB — HCG, QUANTITATIVE, PREGNANCY: hCG, Beta Chain, Quant, S: 3 m[IU]/mL (ref <5)

## 2023-02-04 LAB — LIPASE, BLOOD: Lipase: 64 U/L — ABNORMAL HIGH (ref 11–51)

## 2023-02-04 LAB — GLUCOSE, PLEURAL OR PERITONEAL FLUID: Glucose, Fluid: 249 mg/dL

## 2023-02-04 LAB — MAGNESIUM
Magnesium: 1.5 mg/dL — ABNORMAL LOW (ref 1.7–2.4)
Magnesium: 1.9 mg/dL (ref 1.7–2.4)

## 2023-02-04 MED ORDER — DELFLEX-LC/2.5% DEXTROSE 394 MOSM/L IP SOLN
INTRAPERITONEAL | Status: DC
  Filled 2023-02-04: qty 3000

## 2023-02-04 MED ORDER — HYDROMORPHONE HCL 1 MG/ML IJ SOLN
1.0000 mg | INTRAMUSCULAR | Status: DC | PRN
  Administered 2023-02-04 – 2023-02-07 (×12): 1 mg via INTRAVENOUS
  Filled 2023-02-04 (×12): qty 1

## 2023-02-04 MED ORDER — SODIUM CHLORIDE 0.9 % IV SOLN
1.0000 g | INTRAVENOUS | Status: DC
  Administered 2023-02-05: 1 g via INTRAVENOUS
  Filled 2023-02-04 (×2): qty 10

## 2023-02-04 MED ORDER — POLYETHYLENE GLYCOL 3350 17 G PO PACK
17.0000 g | PACK | Freq: Every day | ORAL | Status: DC | PRN
  Administered 2023-02-07: 17 g via ORAL
  Filled 2023-02-04: qty 1

## 2023-02-04 MED ORDER — ONDANSETRON HCL 4 MG/2ML IJ SOLN
4.0000 mg | Freq: Three times a day (TID) | INTRAMUSCULAR | Status: DC | PRN
  Administered 2023-02-08: 4 mg via INTRAVENOUS
  Filled 2023-02-04: qty 2

## 2023-02-04 MED ORDER — ONDANSETRON HCL 4 MG/2ML IJ SOLN: 4.0000 mg | Freq: Four times a day (QID) | INTRAMUSCULAR | Status: DC | PRN

## 2023-02-04 MED ORDER — LACTATED RINGERS IV BOLUS
500.0000 mL | Freq: Once | INTRAVENOUS | Status: AC
  Administered 2023-02-04: 500 mL via INTRAVENOUS

## 2023-02-04 MED ORDER — IRBESARTAN 150 MG PO TABS
75.0000 mg | ORAL_TABLET | Freq: Every day | ORAL | Status: DC
  Administered 2023-02-04 – 2023-02-08 (×5): 75 mg via ORAL
  Filled 2023-02-04 (×5): qty 1

## 2023-02-04 MED ORDER — MAGNESIUM SULFATE 2 GM/50ML IV SOLN
2.0000 g | Freq: Once | INTRAVENOUS | Status: AC
  Administered 2023-02-04: 2 g via INTRAVENOUS
  Filled 2023-02-04: qty 50

## 2023-02-04 MED ORDER — LACOSAMIDE 50 MG PO TABS
200.0000 mg | ORAL_TABLET | Freq: Two times a day (BID) | ORAL | Status: DC
  Administered 2023-02-04 – 2023-02-08 (×8): 200 mg via ORAL
  Filled 2023-02-04 (×9): qty 4

## 2023-02-04 MED ORDER — PANTOPRAZOLE SODIUM 40 MG PO TBEC
40.0000 mg | DELAYED_RELEASE_TABLET | Freq: Every day | ORAL | Status: DC
  Administered 2023-02-04 – 2023-02-08 (×5): 40 mg via ORAL
  Filled 2023-02-04 (×5): qty 1

## 2023-02-04 MED ORDER — POTASSIUM CHLORIDE 10 MEQ/100ML IV SOLN
10.0000 meq | INTRAVENOUS | Status: AC
  Administered 2023-02-04 (×2): 10 meq via INTRAVENOUS
  Filled 2023-02-04 (×2): qty 100

## 2023-02-04 MED ORDER — ONDANSETRON HCL 4 MG PO TABS
4.0000 mg | ORAL_TABLET | Freq: Three times a day (TID) | ORAL | Status: DC | PRN
  Filled 2023-02-04: qty 1

## 2023-02-04 MED ORDER — ONDANSETRON HCL 4 MG/2ML IJ SOLN
4.0000 mg | Freq: Once | INTRAMUSCULAR | Status: AC
  Administered 2023-02-04: 4 mg via INTRAVENOUS
  Filled 2023-02-04: qty 2

## 2023-02-04 MED ORDER — LACTATED RINGERS IV SOLN: INTRAVENOUS | Status: AC

## 2023-02-04 MED ORDER — GENTAMICIN SULFATE 0.1 % EX OINT
TOPICAL_OINTMENT | Freq: Every day | CUTANEOUS | Status: DC
  Administered 2023-02-08: 1 via TOPICAL
  Filled 2023-02-04: qty 15

## 2023-02-04 MED ORDER — GENTAMICIN SULFATE 0.1 % EX CREA
1.0000 | TOPICAL_CREAM | Freq: Every day | CUTANEOUS | Status: DC
  Filled 2023-02-04: qty 15

## 2023-02-04 MED ORDER — MORPHINE SULFATE (PF) 4 MG/ML IV SOLN
4.0000 mg | Freq: Once | INTRAVENOUS | Status: AC
  Administered 2023-02-04: 4 mg via INTRAVENOUS
  Filled 2023-02-04: qty 1

## 2023-02-04 MED ORDER — LEVETIRACETAM 500 MG PO TABS
500.0000 mg | ORAL_TABLET | Freq: Every day | ORAL | Status: DC
  Administered 2023-02-04 – 2023-02-08 (×5): 500 mg via ORAL
  Filled 2023-02-04 (×5): qty 1

## 2023-02-04 MED ORDER — POTASSIUM CHLORIDE CRYS ER 20 MEQ PO TBCR
40.0000 meq | EXTENDED_RELEASE_TABLET | Freq: Once | ORAL | Status: AC
  Administered 2023-02-04: 40 meq via ORAL
  Filled 2023-02-04: qty 2

## 2023-02-04 MED ORDER — SODIUM CHLORIDE 0.9 % IV SOLN
1.0000 g | Freq: Once | INTRAVENOUS | Status: AC
  Administered 2023-02-04: 1 g via INTRAVENOUS
  Filled 2023-02-04: qty 10

## 2023-02-04 MED ORDER — PREDNISONE 20 MG PO TABS
20.0000 mg | ORAL_TABLET | Freq: Every day | ORAL | Status: DC
  Administered 2023-02-04 – 2023-02-08 (×5): 20 mg via ORAL
  Filled 2023-02-04 (×5): qty 1

## 2023-02-04 MED ORDER — HYDROXYZINE HCL 25 MG PO TABS: 25.0000 mg | ORAL_TABLET | Freq: Two times a day (BID) | ORAL | Status: DC | PRN

## 2023-02-04 MED ORDER — DROPERIDOL 2.5 MG/ML IJ SOLN
2.5000 mg | Freq: Once | INTRAMUSCULAR | Status: AC
  Administered 2023-02-04: 2.5 mg via INTRAVENOUS
  Filled 2023-02-04: qty 2

## 2023-02-04 MED ORDER — ONDANSETRON HCL 4 MG PO TABS: 4.0000 mg | ORAL_TABLET | Freq: Four times a day (QID) | ORAL | Status: DC | PRN

## 2023-02-04 MED ORDER — SODIUM CHLORIDE 0.9% FLUSH
3.0000 mL | Freq: Two times a day (BID) | INTRAVENOUS | Status: DC
  Administered 2023-02-04 – 2023-02-08 (×9): 3 mL via INTRAVENOUS

## 2023-02-04 MED ORDER — HYDROMORPHONE HCL 1 MG/ML IJ SOLN
1.0000 mg | Freq: Once | INTRAMUSCULAR | Status: AC
  Administered 2023-02-04: 1 mg via INTRAVENOUS
  Filled 2023-02-04: qty 1

## 2023-02-04 MED ORDER — ACETAMINOPHEN 650 MG RE SUPP: 650.0000 mg | Freq: Four times a day (QID) | RECTAL | Status: DC | PRN

## 2023-02-04 MED ORDER — ACETAMINOPHEN 325 MG PO TABS
650.0000 mg | ORAL_TABLET | Freq: Four times a day (QID) | ORAL | Status: DC | PRN
  Administered 2023-02-04 – 2023-02-08 (×6): 650 mg via ORAL
  Filled 2023-02-04 (×6): qty 2

## 2023-02-04 MED ORDER — HEPARIN SODIUM (PORCINE) 5000 UNIT/ML IJ SOLN
5000.0000 [IU] | Freq: Three times a day (TID) | INTRAMUSCULAR | Status: DC
  Administered 2023-02-04 – 2023-02-07 (×2): 5000 [IU] via SUBCUTANEOUS
  Filled 2023-02-04 (×8): qty 1

## 2023-02-04 MED ORDER — VANCOMYCIN VARIABLE DOSE PER UNSTABLE RENAL FUNCTION (PHARMACIST DOSING): Status: DC

## 2023-02-04 MED ORDER — VANCOMYCIN HCL IN DEXTROSE 1-5 GM/200ML-% IV SOLN
1000.0000 mg | Freq: Once | INTRAVENOUS | Status: AC
  Administered 2023-02-04: 1000 mg via INTRAVENOUS
  Filled 2023-02-04: qty 200

## 2023-02-04 NOTE — ED Provider Notes (Signed)
[facility] Provider Note    Event Date/Time   First MD Initiated Contact with Patient 02/04/23 0750     (approximate)   History   Chief Complaint Abdominal Pain  HPI  Melinda Steele is a 22 y.o. female with past medical history of hypertension, seizures, lupus, and ESRD on PD who presents to the ED complaining of abdominal pain.  Patient reports that she developed severe pain across her abdomen diffusely earlier this morning.  Pain has been present constantly since then and is described as sharp, associated with nausea and a couple episodes of vomiting.  She denies any associated diarrhea, states she continues to make a small amount of urine but has not had any dysuria.  She denies any fevers, cough, chest pain, or shortness of breath.  She was finishing up a peritoneal dialysis cycle when EMS arrived this morning, denies any issues with her PD.     Physical Exam   Triage Vital Signs: ED Triage Vitals  Enc Vitals Group     BP      Pulse      Resp      Temp      Temp src      SpO2      Weight      Height      Head Circumference      Peak Flow      Pain Score      Pain Loc      Pain Edu?      Excl. in GC?     Most recent vital signs: Vitals:   02/04/23 0930 02/04/23 1114  BP: (!) 164/128 (!) 162/123  Pulse:  96  Resp:  20  Temp:    SpO2:  100%    Constitutional: Alert and oriented. Eyes: Conjunctivae are normal. Head: Atraumatic. Nose: No congestion/rhinnorhea. Mouth/Throat: Mucous membranes are moist.  Cardiovascular: Normal rate, regular rhythm. Grossly normal heart sounds.  2+ radial pulses bilaterally. Respiratory: Normal respiratory effort.  No retractions. Lungs CTAB. Gastrointestinal: Soft and diffusely tender to palpation with voluntary guarding, no distention noted.  PD catheter in place to left lower quadrant with no surrounding erythema, warmth, or drainage. Musculoskeletal: No lower extremity tenderness nor edema.   Neurologic:  Normal speech and language. No gross focal neurologic deficits are appreciated.    ED Results / Procedures / Treatments   Labs (all labs ordered are listed, but only abnormal results are displayed) Labs Reviewed  CBC WITH DIFFERENTIAL/PLATELET - Abnormal; Notable for the following components:      Result Value   RBC 2.80 (*)    Hemoglobin 7.3 (*)    HCT 23.3 (*)    nRBC 0.4 (*)    Abs Immature Granulocytes 0.19 (*)    All other components within normal limits  COMPREHENSIVE METABOLIC PANEL - Abnormal; Notable for the following components:   Potassium 2.6 (*)    BUN 34 (*)    Creatinine, Ser 12.64 (*)    Calcium 7.7 (*)    Total Protein 5.1 (*)    Albumin 1.9 (*)    GFR, Estimated 4 (*)    All other components within normal limits  LIPASE, BLOOD - Abnormal; Notable for the following components:   Lipase 64 (*)    All other components within normal limits  URINALYSIS, ROUTINE W REFLEX MICROSCOPIC - Abnormal; Notable for the following components:   APPearance CLOUDY (*)    pH 8.5 (*)    Glucose, UA  250 (*)    Hgb urine dipstick SMALL (*)    Protein, ur >300 (*)    Leukocytes,Ua MODERATE (*)    Bacteria, UA RARE (*)    All other components within normal limits  MAGNESIUM - Abnormal; Notable for the following components:   Magnesium 1.5 (*)    All other components within normal limits  BODY FLUID CULTURE W GRAM STAIN  HCG, QUANTITATIVE, PREGNANCY  BODY FLUID CELL COUNT WITH DIFFERENTIAL  PROTEIN, PLEURAL OR PERITONEAL FLUID  GLUCOSE, PLEURAL OR PERITONEAL FLUID  POC URINE PREG, ED   ED ECG REPORT I, Chesley Noon, the attending physician, personally viewed and interpreted this ECG.   Date: 02/04/2023  EKG Time: 8:51  Rate: 94  Rhythm: normal sinus rhythm  Axis: Normal  Intervals: Incomplete RBBB  ST&T Change: Inferolateral T wave inversions   RADIOLOGY CT abdomen/pelvis reviewed and interpreted by me with no dilated bowel loops, focal fluid  collections, or inflammatory changes.  PROCEDURES:  Critical Care performed: No  Procedures   MEDICATIONS ORDERED IN ED: Medications  droperidol (INAPSINE) 2.5 MG/ML injection 2.5 mg (has no administration in time range)  vancomycin (VANCOCIN) IVPB 1000 mg/200 mL premix (has no administration in time range)  ceFEPIme (MAXIPIME) 1 g in sodium chloride 0.9 % 100 mL IVPB (has no administration in time range)  morphine (PF) 4 MG/ML injection 4 mg (4 mg Intravenous Given 02/04/23 0803)  ondansetron (ZOFRAN) injection 4 mg (4 mg Intravenous Given 02/04/23 0803)  magnesium sulfate IVPB 2 g 50 mL (0 g Intravenous Stopped 02/04/23 1122)  potassium chloride 10 mEq in 100 mL IVPB (0 mEq Intravenous Stopped 02/04/23 1225)  HYDROmorphone (DILAUDID) injection 1 mg (1 mg Intravenous Given 02/04/23 0938)  potassium chloride SA (KLOR-CON M) CR tablet 40 mEq (40 mEq Oral Given 02/04/23 1016)     IMPRESSION / MDM / ASSESSMENT AND PLAN / ED COURSE  I reviewed the triage vital signs and the nursing notes.                              22 y.o. female with past medical history of hypertension, seizures, lupus, and ESRD on PD who presents to the ED complaining of severe abdominal pain associated with nausea and vomiting starting earlier this morning.  Patient's presentation is most consistent with acute presentation with potential threat to life or bodily function.  Differential diagnosis includes, but is not limited to, bowel obstruction, pancreatitis, hepatitis, cholecystitis, biliary colic, gastritis, appendicitis, diverticulitis, intra-abdominal abscess, peritonitis.  Patient uncomfortable appearing but in no acute distress, vital signs are remarkable for hypertension but otherwise reassuring.  Abdomen is soft but diffusely tender to palpation with voluntary guarding, will further assess with CT imaging, unfortunately unable to utilize contrast in this peritoneal dialysis patient.  We will treat  symptomatically with IV morphine and Zofran, labs and urinalysis are pending at this time.  I have also contacted the dialysis nurse to assist with obtaining peritoneal fluid for cell counts, Gram stain, and culture.  Labs remarkable for hypokalemia and hypomagnesemia, no apparent EKG changes noted.  LFTs and lipase are unremarkable, no significant leukocytosis noted but patient does appear anemic beyond her usual baseline.  No evidence of bleeding at this time, we will hold off on transfusion and continue to monitor.  CT imaging is negative for acute process, however patient continues to have severe pain in her abdomen despite multiple rounds of medication.  I am  clinically concerned for SBP and peritoneal fluid results are pending at this time.  Case discussed with Dr. Suezanne Jacquet of nephrology, who recommends empiric antibiotics and admission.  Case discussed with hospitalist for admission.      FINAL CLINICAL IMPRESSION(S) / ED DIAGNOSES   Final diagnoses:  Generalized abdominal pain  ESRD on peritoneal dialysis [facility])     Rx / DC Orders   ED Discharge Orders     None        Note:  This document was prepared using Dragon voice recognition software and may include unintentional dictation errors.   Chesley Noon, MD 02/04/23 1235

## 2023-02-04 NOTE — ED Notes (Addendum)
Patient declines any medications in the IV in the right [facility]. Patient states it burns.

## 2023-02-04 NOTE — Progress Notes (Signed)
PD fluid sample taken and handed to lab. Effluent is cloudy.

## 2023-02-04 NOTE — Assessment & Plan Note (Signed)
With recovered EF, last 50-55%.  Currently euvolemic on exam.  - Continue home GDMT - Hold home diuretics given risk for hypovolemia in the setting of peritonitis

## 2023-02-04 NOTE — Assessment & Plan Note (Signed)
Hypokalemia with hypomagnesemia.  - Potassium 60 mEq total ordered today - Magnesium sulfate 2 g - Repeat BMP and magnesium in the evening

## 2023-02-04 NOTE — Progress Notes (Signed)
Md inform about  patient elevated temp , and concerns of being dehydrated. Per md hold tx for tonight and will check tomorrow.   02/04/23 2206  Vitals  Temp (!) 101.5 F (38.6 C)  Temp Source Oral  BP (!) 139/104  MAP (mmHg) 116  BP Location Right Arm  BP Method Automatic  Patient Position (if appropriate) Lying  Pulse Rate (!) 120  Pulse Rate Source Monitor  ECG Heart Rate (!) 122  Resp 16  MEWS COLOR  MEWS Score Color Red  Oxygen Therapy  SpO2 99 %  O2 Device Room Air  MEWS Score  MEWS Temp 2  MEWS Systolic 0  MEWS Pulse 2  MEWS RR 0  MEWS LOC 0  MEWS Score 4

## 2023-02-04 NOTE — ED Triage Notes (Signed)
Patient began having abdominal pain around 0500 this morning.

## 2023-02-04 NOTE — Plan of Care (Signed)

## 2023-02-04 NOTE — Assessment & Plan Note (Signed)
Chronic anemia with hemoglobin averaging between 7.5 - 9.  Hemoglobin today is 7.3.  No evidence of bleeding on exam.  Previous history of hemolytic anemia, however no indication this is occurring at this time.  -Daily CBC

## 2023-02-04 NOTE — Assessment & Plan Note (Signed)
Patient is presenting with sudden onset abdominal pain that is severe in nature.  Primary differential at this time is peritonitis secondary to PD.  Previous history of such with negative cultures treated empirically with ceftazidime and vancomycin via PD catheter. Preliminary peritoneal fluid cultures with gram-negative rods.  Elevated cell count above 1000 - Continue IV cefepime and vancomycin - Narrow per culture data - ID was consulted-will see the patient tomorrow but normally nephrology manages intraperitoneal antibiotics if needed -Continue with supportive care

## 2023-02-04 NOTE — Consult Note (Signed)
Pharmacy Antibiotic Note  Melinda Steele is a 22 y.o. female admitted on 02/04/2023 with  peritonitis .  Pharmacy has been consulted for Vancomycin and Cefepime dosing. Patient on peritoneal dialysis.   Plan: Vancomycin 1000mg  (19.6mg /kg) IV x 1 dose given 5/11 @ 1259. Will order Vancomycin random 3-4 days after initial dose to and re-dose as needed to keep conc > 15 mcg/ml.  Cefepime 1gm IV x 1 dose given 5/11 @ 1229. Will start cefepime 1gm IV q 24hrs  Height: 5\' 6"  (167.6 cm) Weight: 50.8 kg (112 lb) IBW/kg (Calculated) : 59.3  Temp (24hrs), Avg:98.1 F (36.7 C), Min:98.1 F (36.7 C), Max:98.1 F (36.7 C)  Recent Labs  Lab 02/04/23 0756  WBC 4.6  CREATININE 12.64*    Estimated Creatinine Clearance: 5.6 mL/min (A) (by C-G formula based on SCr of 12.64 mg/dL (H)).    Allergies  Allergen Reactions   Amlodipine Swelling    Antimicrobials this admission: 5/11 Cefepime >>  5/11 Vancomycin >>    Microbiology results: 5/11: Peritoneal fluid culture: collected  Thank you for allowing pharmacy to be a part of this patient's care.  Jaleisa Brose Rodriguez-Guzman PharmD, BCPS 02/04/2023 1:56 PM

## 2023-02-04 NOTE — Assessment & Plan Note (Addendum)
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  Follows with [facility] rheumatology.  - Continue home immunosuppressive therapy

## 2023-02-04 NOTE — ED Notes (Signed)
Pt refusing heparin

## 2023-02-04 NOTE — Assessment & Plan Note (Signed)
Patient is currently on peritoneal dialysis.  - Nephrology consulted; appreciate their recommendations

## 2023-02-04 NOTE — Progress Notes (Addendum)
Referring Provider: No ref. provider found Primary Care Physician:  [facility] Primary Nephrologist: [facility]  Reason for Consultation: ESRD  HPI: She is a 22 year old female with hypertension, lupus, history of pericardial tamponade, congestive heart failure, seizures and end-stage renal disease secondary to lupus erythematosus and has been on peritoneal dialysis for the last 2 years.  She is now being admitted with history of diffuse abdominal pain.  She had a CT scan of the abdomen and pelvis done which was negative.  Patient says she had 2 episodes of possible peritonitis and was treated with IV antibiotics previously. The peritoneal dialysis fluid was drawn this morning which was cloudy.  She denies any history of chest pain, shortness of breath, fever or chills.  She is also found to have hypokalemia and had potassium supplemented.  Past Medical History:  Diagnosis Date   Hypertension    Lupus (HCC)    Lupus nephritis (HCC)    Seizure (HCC)     Past Surgical History:  Procedure Laterality Date   CAPD INSERTION N/A 08/27/2021   Procedure: LAPAROSCOPIC INSERTION CONTINUOUS AMBULATORY PERITONEAL DIALYSIS  (CAPD) CATHETER;  Surgeon: Leafy Ro, MD;  Location: [facility] ORS;  Service: General;  Laterality: N/A;   DIALYSIS/PERMA CATHETER INSERTION N/A 08/23/2021   Procedure: DIALYSIS/PERMA CATHETER INSERTION;  Surgeon: Annice Needy, MD;  Location: [facility] INVASIVE CV LAB;  Service: Cardiovascular;  Laterality: N/A;    Prior to Admission medications   Medication Sig Start Date End Date Taking? Authorizing Provider  carvedilol (COREG) 25 MG tablet Take 25 mg by mouth daily.    [provider]  cloNIDine (CATAPRES - DOSED IN MG/24 HR) 0.3 mg/24hr patch Place 1 patch (0.3 mg total) onto the skin once a week. 12/10/20   Erin Fulling, MD  hydroxychloroquine (PLAQUENIL) 200 MG tablet Take 400 mg by mouth daily.    [provider]  isosorbide-hydrALAZINE  (BIDIL) 20-37.5 MG tablet Take 1 tablet by mouth 3 (three) times daily. 08/28/21   Arnetha Courser, MD  lacosamide (VIMPAT) 50 MG TABS tablet Take 150 mg by mouth 2 (two) times daily.    [provider]  methylPREDNISolone sodium succinate (SOLU-MEDROL) 125 mg/2 mL injection Inject 1.28 mLs (80 mg total) into the vein daily. Please taper slowly to her home dose 08/28/21   Arnetha Courser, MD  multivitamin (RENA-VIT) TABS tablet Take 1 tablet by mouth at bedtime. 08/28/21   Arnetha Courser, MD  Nutritional Supplements (FEEDING SUPPLEMENT, NEPRO CARB STEADY,) LIQD Take 237 mLs by mouth 3 (three) times daily between meals. 08/28/21   Arnetha Courser, MD  oxyCODONE (ROXICODONE) 5 MG immediate release tablet Take 1 tablet (5 mg total) by mouth every 12 (twelve) hours as needed for severe pain or breakthrough pain. 07/08/22 07/08/23  Delton Prairie, MD  pantoprazole (PROTONIX) 40 MG tablet Take 1 tablet (40 mg total) by mouth daily. 08/28/21   Arnetha Courser, MD  polyethylene glycol (MIRALAX / GLYCOLAX) 17 g packet Take 17 g by mouth daily as needed for moderate constipation. 08/28/21   Arnetha Courser, MD  pravastatin (PRAVACHOL) 20 MG tablet Take 20 mg by mouth daily.    [provider]  predniSONE (DELTASONE) 20 MG tablet Take 20 mg by mouth daily with breakfast.    [provider]  sacubitril-valsartan (ENTRESTO) 24-26 MG Take 1 tablet by mouth 2 (two) times daily. 08/28/21   Arnetha Courser, MD  spironolactone (ALDACTONE) 25 MG tablet Take 12.5 mg by mouth daily.  [provider]  torsemide (DEMADEX) 20 MG tablet Take 40 mg by mouth daily.    [provider]    Current Facility-Administered Medications  Medication Dose Route Frequency Provider Last Rate Last Admin   ceFEPIme (MAXIPIME) 1 g in sodium chloride 0.9 % 100 mL IVPB  1 g Intravenous Once Chesley Noon, MD 200 mL/hr at 02/04/23 1229 1 g at 02/04/23 1229   droperidol (INAPSINE) 2.5 MG/ML injection 2.5 mg  2.5 mg  Intravenous Once Chesley Noon, MD       vancomycin (VANCOCIN) IVPB 1000 mg/200 mL premix  1,000 mg Intravenous Once Chesley Noon, MD       Current Outpatient Medications  Medication Sig Dispense Refill   carvedilol (COREG) 25 MG tablet Take 25 mg by mouth daily.     cloNIDine (CATAPRES - DOSED IN MG/24 HR) 0.3 mg/24hr patch Place 1 patch (0.3 mg total) onto the skin once a week. 4 patch 12   hydroxychloroquine (PLAQUENIL) 200 MG tablet Take 400 mg by mouth daily.     isosorbide-hydrALAZINE (BIDIL) 20-37.5 MG tablet Take 1 tablet by mouth 3 (three) times daily.     lacosamide (VIMPAT) 50 MG TABS tablet Take 150 mg by mouth 2 (two) times daily.     methylPREDNISolone sodium succinate (SOLU-MEDROL) 125 mg/2 mL injection Inject 1.28 mLs (80 mg total) into the vein daily. Please taper slowly to her home dose 1 each 0   multivitamin (RENA-VIT) TABS tablet Take 1 tablet by mouth at bedtime.  0   Nutritional Supplements (FEEDING SUPPLEMENT, NEPRO CARB STEADY,) LIQD Take 237 mLs by mouth 3 (three) times daily between meals.  0   oxyCODONE (ROXICODONE) 5 MG immediate release tablet Take 1 tablet (5 mg total) by mouth every 12 (twelve) hours as needed for severe pain or breakthrough pain. 10 tablet 0   pantoprazole (PROTONIX) 40 MG tablet Take 1 tablet (40 mg total) by mouth daily.     polyethylene glycol (MIRALAX / GLYCOLAX) 17 g packet Take 17 g by mouth daily as needed for moderate constipation. 14 each 0   pravastatin (PRAVACHOL) 20 MG tablet Take 20 mg by mouth daily.     predniSONE (DELTASONE) 20 MG tablet Take 20 mg by mouth daily with breakfast.     sacubitril-valsartan (ENTRESTO) 24-26 MG Take 1 tablet by mouth 2 (two) times daily. 60 tablet    spironolactone (ALDACTONE) 25 MG tablet Take 12.5 mg by mouth daily.     torsemide (DEMADEX) 20 MG tablet Take 40 mg by mouth daily.      Allergies as of 02/04/2023 - Review Complete 02/04/2023  Allergen Reaction Noted   Amlodipine Swelling  11/06/2020    Family History  Problem Relation Age of Onset   Hyperlipidemia Maternal Grandmother        a. many medical problems, unknown per prior note    Social History   Socioeconomic History   Marital status: Single    Spouse name: Not on file   Number of children: Not on file   Years of education: Not on file   Highest education level: Not on file  Occupational History   Not on file  Tobacco Use   Smoking status: Never   Smokeless tobacco: Never  Substance and Sexual Activity   Alcohol use: Never   Drug use: Never   Sexual activity: Yes    Birth control/protection: OCP  Other Topics Concern   Not on file  Social History Narrative   Not on  file   Social Determinants of Health   Financial Resource Strain: Not on file  Food Insecurity: Not on file  Transportation Needs: Not on file  Physical Activity: Not on file  Stress: Not on file  Social Connections: Not on file  Intimate Partner Violence: Not on file    Physical Exam: Vital signs in last 24 hours: Temp:  [98.1 F (36.7 C)] 98.1 F (36.7 C) (05/11 0808) Pulse Rate:  [96-100] 96 (05/11 1114) Resp:  [18-20] 20 (05/11 1114) BP: (151-164)/(119-128) 162/123 (05/11 1114) SpO2:  [100 %] 100 % (05/11 1114) Weight:  [50.8 kg] 50.8 kg (05/11 0809)   General:   Alert,  Well-developed, well-nourished, pleasant and cooperative in NAD Head:  Normocephalic and atraumatic. Eyes:  Sclera clear, no icterus.   Conjunctiva pink. Ears:  Normal auditory acuity. Nose:  No deformity, discharge,  or lesions. Lungs:  Clear throughout to auscultation.   No wheezes, crackles, or rhonchi. No acute distress. Heart:  Regular rate and rhythm; no murmurs, clicks, rubs,  or gallops. Abdomen:  Soft, nontender and nondistended. No masses, hepatosplenomegaly or hernias noted. Normal bowel sounds, without guarding, and without rebound.   Extremities:  Without clubbing or edema.  Intake/Output from previous day: No intake/output data  recorded. Intake/Output this shift: No intake/output data recorded.  Lab Results: Recent Labs    02/04/23 0756  WBC 4.6  HGB 7.3*  HCT 23.3*  PLT 203   BMET Recent Labs    02/04/23 0756  NA 136  K 2.6*  CL 99  CO2 28  GLUCOSE 94  BUN 34*  CREATININE 12.64*  CALCIUM 7.7*   LFT Recent Labs    02/04/23 0756  PROT 5.1*  ALBUMIN 1.9*  AST 20  ALT 14  ALKPHOS 58  BILITOT 0.6   PT/INR No results for input(s): "LABPROT", "INR" in the last 72 hours. Hepatitis Panel No results for input(s): "HEPBSAG", "HCVAB", "HEPAIGM", "HEPBIGM" in the last 72 hours.  Studies/Results: CT ABDOMEN PELVIS WO CONTRAST  Result Date: 02/04/2023 CLINICAL DATA:  Abdominal pain. EXAM: CT ABDOMEN AND PELVIS WITHOUT CONTRAST TECHNIQUE: Multidetector CT imaging of the abdomen and pelvis was performed following the standard protocol without IV contrast. RADIATION DOSE REDUCTION: This exam was performed according to the departmental dose-optimization program which includes automated exposure control, adjustment of the mA and/or kV according to patient size and/or use of iterative reconstruction technique. COMPARISON:  08/22/2021 FINDINGS: Lower chest: Unremarkable. Hepatobiliary: No suspicious focal abnormality in the liver on this study without intravenous contrast. There is no evidence for gallstones, gallbladder wall thickening, or pericholecystic fluid. No intrahepatic or extrahepatic biliary dilation. Pancreas: No focal mass lesion. No dilatation of the main duct. No intraparenchymal cyst. No peripancreatic edema. Spleen: No splenomegaly. No focal mass lesion. Adrenals/Urinary Tract: No adrenal nodule or mass. Kidneys unremarkable. No evidence for hydroureter. The urinary bladder appears normal for the degree of distention. Stomach/Bowel: Stomach is unremarkable. No gastric wall thickening. No evidence of outlet obstruction. Duodenum is normally positioned as is the ligament of Treitz. No small bowel wall  thickening. No small bowel dilatation. The appendix is normal. No gross colonic mass. No colonic wall thickening. Vascular/Lymphatic: No abdominal aortic aneurysm. No abdominal aortic atherosclerotic calcification. There is no gastrohepatic or hepatoduodenal ligament lymphadenopathy. No retroperitoneal or mesenteric lymphadenopathy. No pelvic sidewall lymphadenopathy. Reproductive: Unremarkable. Other: Moderate volume fluid is identified in the peritoneal cavity of the abdomen and pelvis with scattered foci of intraperitoneal free gas. These findings are presumably related to  the patient's peritoneal dialysis with peritoneal dialysis catheter seen coiled in the posterior left pelvis. Musculoskeletal: No worrisome lytic or sclerotic osseous abnormality. IMPRESSION: 1. No acute findings in the abdomen or pelvis. Specifically, no findings to explain the patient's history of abdominal pain. 2. Moderate volume fluid in the peritoneal cavity of the abdomen and pelvis with scattered foci of intraperitoneal free gas. These findings are presumably related to the patient's peritoneal dialysis with peritoneal dialysis catheter seen coiled in the posterior left pelvis. Electronically Signed   By: Kennith Center M.D.   On: 02/04/2023 09:31    Assessment/Plan:  22 year old female with hypertension, lupus, history of pericardial tamponade, congestive heart failure, seizures and end-stage renal disease secondary to lupus erythematosus and has been on peritoneal dialysis for the last 2 years.  She is now being admitted with history of diffuse abdominal pain.  She had a CT scan of the abdomen and pelvis done which was negative.  Patient says she had 2 episodes of possible peritonitis and was treated with IV antibiotics previously. The peritoneal dialysis fluid was drawn this morning which was cloudy.   ESRD: Will perform CCPD starting tonight.  Orders written.  ANEMIA: Will continue to monitor and follow protocols.  MBD: We  will check PTH, calcium and phosphorus levels.  HTN/VOL: Blood pressure is elevated.  Will need to continue clonidine, Entresto, Aldactone and carvedilol.  ACCESS/peritonitis: PD catheter.  PD fluid was sent for cell counts and cultures.  Will empirically start on cefepime and also vancomycin.  Advise ID evaluation.  Will continue to monitor Vanco troughs.  Hypokalemia: Will supplement and monitor closely.  Spoke to the family at bedside.   LOS: 0 Lorain Childes, MD [facility] @TODAY @12 :45 PM

## 2023-02-04 NOTE — Assessment & Plan Note (Signed)
-   Continue home Vimpat

## 2023-02-04 NOTE — H&P (Addendum)
History and Physical    Patient: Melinda Steele ZOX:[id number] DOB: 01/18/2001 DOA: 02/04/2023 DOS: the patient was seen and examined on 02/04/2023 PCP: [facility]  Patient coming from: Home  Chief Complaint:  Chief Complaint  Patient presents with   Abdominal Pain   HPI: Melinda Steele is a 22 y.o. 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  Melinda Steele states that she felt fine yesterday and was able to do her PD throughout the night, however this morning prior to the end of her PT session around 5 AM, she had sudden onset abdominal pain that was diffuse.  Abdominal pain has stayed persistent then without significant changes but she describes it as severe in nature.  She endorses some nausea and vomiting due to the pain, but denies any chest pain, shortness of breath, palpitations or diarrhea.  History of suspected PD associated peritonitis in November 2023 and then March 2024, with both cultures demonstrating no growth.  Treated empirically with intraperitoneal ceftazidime and vancomycin.  ED course: On arrival to the ED, patient was hypertensive at 158/119 with heart rate of 100.  He was saturating at 100% on room air.  She was afebrile at 98.1.  Initial workup notable for hemoglobin of 7.3, WBC of 4.6, platelets 203, potassium 2.6, mag 1.5, BUN 34, calcium 7.7, albumin 1.9, lipase 64, GFR 4.  Urinalysis with glucosuria, hematuria, leukocytes, proteinuria, and rare bacteria with over 50 WBC/hpf. CT of the abdomen was obtained that demonstrated no acute findings, however moderate volume fluid in the peritoneal abdomen with scattered foci of intraperitoneal free gas consistent with peritoneal dialysis.  Review of Systems: As  mentioned in the history of present illness. All other systems reviewed and are negative.  Past Medical History:  Diagnosis Date   Hypertension    Lupus (HCC)    Lupus nephritis (HCC)    Seizure (HCC)    Past Surgical History:  Procedure Laterality Date   CAPD INSERTION N/A 08/27/2021   Procedure: LAPAROSCOPIC INSERTION CONTINUOUS AMBULATORY PERITONEAL DIALYSIS  (CAPD) CATHETER;  Surgeon: Leafy Ro, MD;  Location: [facility] ORS;  Service: General;  Laterality: N/A;   DIALYSIS/PERMA CATHETER INSERTION N/A 08/23/2021   Procedure: DIALYSIS/PERMA CATHETER INSERTION;  Surgeon: Annice Needy, MD;  Location: [facility] INVASIVE CV LAB;  Service: Cardiovascular;  Laterality: N/A;   Social History:  reports that she has never smoked. She has never used smokeless tobacco. She reports that she does not drink alcohol and does not use drugs.  Allergies  Allergen Reactions   Amlodipine Swelling    Family History  Problem Relation Age of Onset   Hyperlipidemia Maternal Grandmother        a. many medical problems, unknown per prior note    Prior to Admission medications   Medication Sig Start Date End Date Taking? Authorizing Provider  carvedilol (COREG) 25 MG tablet Take 25 mg by mouth daily.    [provider]  cloNIDine (CATAPRES - DOSED IN MG/24 HR) 0.3 mg/24hr patch Place 1 patch (0.3 mg total) onto the skin once a week. 12/10/20   Erin Fulling, MD  hydroxychloroquine (PLAQUENIL) 200 MG tablet Take 400 mg by mouth daily.    [provider]  isosorbide-hydrALAZINE (BIDIL) 20-37.5 MG tablet Take 1 tablet by mouth 3 (three) times daily.  08/28/21   Arnetha Courser, MD  lacosamide (VIMPAT) 50 MG TABS tablet Take 150 mg by mouth 2 (two) times daily.    [provider]  methylPREDNISolone sodium succinate (SOLU-MEDROL) 125 mg/2 mL injection Inject 1.28 mLs (80 mg total) into the vein daily. Please taper slowly to her home dose 08/28/21   Arnetha Courser, MD  multivitamin (RENA-VIT)  TABS tablet Take 1 tablet by mouth at bedtime. 08/28/21   Arnetha Courser, MD  Nutritional Supplements (FEEDING SUPPLEMENT, NEPRO CARB STEADY,) LIQD Take 237 mLs by mouth 3 (three) times daily between meals. 08/28/21   Arnetha Courser, MD  oxyCODONE (ROXICODONE) 5 MG immediate release tablet Take 1 tablet (5 mg total) by mouth every 12 (twelve) hours as needed for severe pain or breakthrough pain. 07/08/22 07/08/23  Delton Prairie, MD  pantoprazole (PROTONIX) 40 MG tablet Take 1 tablet (40 mg total) by mouth daily. 08/28/21   Arnetha Courser, MD  polyethylene glycol (MIRALAX / GLYCOLAX) 17 g packet Take 17 g by mouth daily as needed for moderate constipation. 08/28/21   Arnetha Courser, MD  pravastatin (PRAVACHOL) 20 MG tablet Take 20 mg by mouth daily.    [provider]  predniSONE (DELTASONE) 20 MG tablet Take 20 mg by mouth daily with breakfast.    [provider]  sacubitril-valsartan (ENTRESTO) 24-26 MG Take 1 tablet by mouth 2 (two) times daily. 08/28/21   Arnetha Courser, MD  spironolactone (ALDACTONE) 25 MG tablet Take 12.5 mg by mouth daily.    [provider]  torsemide (DEMADEX) 20 MG tablet Take 40 mg by mouth daily.    [provider]    Physical Exam: Vitals:   02/04/23 0809 02/04/23 0900 02/04/23 0930 02/04/23 1114  BP:  (!) 162/121 (!) 164/128 (!) 162/123  Pulse:    96  Resp:    20  Temp:      TempSrc:      SpO2:    100%  Weight: 50.8 kg     Height: 5\' 6"  (1.676 m)      Physical Exam Vitals and nursing note reviewed.  Constitutional:      General: She is not in acute distress.    Appearance: She is normal weight.  HENT:     Head: Normocephalic and atraumatic.  Cardiovascular:     Rate and Rhythm: Regular rhythm. Tachycardia present.  Pulmonary:     Effort: Pulmonary effort is normal. No respiratory distress.     Breath sounds: No wheezing, rhonchi or rales.  Abdominal:     General: Bowel sounds are absent. There is no distension.      Palpations: Abdomen is soft.     Tenderness: There is abdominal tenderness (severe diffusely). There is guarding (mild).     Hernia: No hernia is present.  Musculoskeletal:     Right lower leg: No edema.     Left lower leg: No edema.  Skin:    General: Skin is warm and dry.  Neurological:     General: No focal deficit present.     Mental Status: She is alert and oriented to person, place, and time.  Psychiatric:        Mood and Affect: Affect is flat.        Speech: Speech normal.        Behavior: Behavior is withdrawn. Behavior is cooperative.     Data Reviewed: CBC with WBC of 4.6, hemoglobin 7.3, MCV 83 and platelets of 203 CMP with sodium of 136, potassium  2.6, bicarb 28, BUN 34, creatinine 12, calcium 7.7, anion gap 9, albumin 1.9, AST 20, ALT 14 and GFR 4 Magnesium low at 1.5 Lipase minimally elevated 64 Serum hCG negative Urinalysis with glucosuria, hematuria, moderate leukocytes, pH of 8.5, proteinuria, rare bacteria and over 50 WBC/hpf.  EKG personally reviewed.  Sinus rhythm with rate of 94.  T wave inversion in lateral and inferior leads.  Last EKG in the [facility] system is in November 2022, which T waves are upright.  CT ABDOMEN PELVIS WO CONTRAST  Result Date: 02/04/2023 CLINICAL DATA:  Abdominal pain. EXAM: CT ABDOMEN AND PELVIS WITHOUT CONTRAST TECHNIQUE: Multidetector CT imaging of the abdomen and pelvis was performed following the standard protocol without IV contrast. RADIATION DOSE REDUCTION: This exam was performed according to the departmental dose-optimization program which includes automated exposure control, adjustment of the mA and/or kV according to patient size and/or use of iterative reconstruction technique. COMPARISON:  08/22/2021 FINDINGS: Lower chest: Unremarkable. Hepatobiliary: No suspicious focal abnormality in the liver on this study without intravenous contrast. There is no evidence for gallstones, gallbladder wall thickening, or pericholecystic fluid.  No intrahepatic or extrahepatic biliary dilation. Pancreas: No focal mass lesion. No dilatation of the main duct. No intraparenchymal cyst. No peripancreatic edema. Spleen: No splenomegaly. No focal mass lesion. Adrenals/Urinary Tract: No adrenal nodule or mass. Kidneys unremarkable. No evidence for hydroureter. The urinary bladder appears normal for the degree of distention. Stomach/Bowel: Stomach is unremarkable. No gastric wall thickening. No evidence of outlet obstruction. Duodenum is normally positioned as is the ligament of Treitz. No small bowel wall thickening. No small bowel dilatation. The appendix is normal. No gross colonic mass. No colonic wall thickening. Vascular/Lymphatic: No abdominal aortic aneurysm. No abdominal aortic atherosclerotic calcification. There is no gastrohepatic or hepatoduodenal ligament lymphadenopathy. No retroperitoneal or mesenteric lymphadenopathy. No pelvic sidewall lymphadenopathy. Reproductive: Unremarkable. Other: Moderate volume fluid is identified in the peritoneal cavity of the abdomen and pelvis with scattered foci of intraperitoneal free gas. These findings are presumably related to the patient's peritoneal dialysis with peritoneal dialysis catheter seen coiled in the posterior left pelvis. Musculoskeletal: No worrisome lytic or sclerotic osseous abnormality. IMPRESSION: 1. No acute findings in the abdomen or pelvis. Specifically, no findings to explain the patient's history of abdominal pain. 2. Moderate volume fluid in the peritoneal cavity of the abdomen and pelvis with scattered foci of intraperitoneal free gas. These findings are presumably related to the patient's peritoneal dialysis with peritoneal dialysis catheter seen coiled in the posterior left pelvis. Electronically Signed   By: Kennith Center M.D.   On: 02/04/2023 09:31    Results are pending, will review when available.  Assessment and Plan:  * Peritoneal dialysis-associated peritonitis  [facility]) Patient is presenting with sudden onset abdominal pain that is severe in nature.  Primary differential at this time is peritonitis secondary to PD.  Previous history of such with negative cultures treated empirically with ceftazidime and vancomycin via PD catheter.  - Peritoneal fluid cell count and culture pending - Continue IV cefepime and vancomycin - Narrow per culture data - Will consider ID consultation - Dilaudid for pain control  Electrolyte abnormality Hypokalemia with hypomagnesemia.  - Potassium 60 mEq total ordered today - Magnesium sulfate 2 g - Repeat BMP and magnesium in the evening  ESRD (end-stage renal disease) due to SLE [facility]) Patient is currently on peritoneal dialysis.  - Nephrology consulted; appreciate their recommendations  Anemia of chronic disease Chronic anemia with hemoglobin averaging between 7.5 - 9.  Hemoglobin today is 7.3.  No evidence of bleeding on exam.  Previous history of hemolytic anemia, however no indication this is occurring at this time.  -Daily CBC  Seizures (HCC) - Continue home Vimpat  Chronic HFrEF (heart failure with reduced ejection fraction) (HCC) With recovered EF, last 50-55%.  Currently euvolemic on exam.  - Continue home GDMT - Hold home diuretics given risk for hypovolemia in the setting of peritonitis  Systemic lupus erythematosus (HCC) History of SLE diagnosed in 2017 with multiple complications including lupus nephritis, pericarditis with pleural effusion causing tamponade, vasculitis.  Follows with [facility] rheumatology.  - Continue home immunosuppressive therapy  Advance Care Planning:   Code Status: Full Code   Consults: Nephrology  Family Communication: Patient's mother updated at bedside  Severity of Illness: The appropriate patient status for this patient is OBSERVATION. Observation status is judged to be reasonable and necessary in order to provide the required intensity of service to ensure the patient's  safety. The patient's presenting symptoms, physical exam findings, and initial radiographic and laboratory data in the context of their medical condition is felt to place them at decreased risk for further clinical deterioration. Furthermore, it is anticipated that the patient will be medically stable for discharge from the hospital within 2 midnights of admission.   Author: Verdene Lennert, MD 02/04/2023 1:13 PM  For on call review [email or web address].

## 2023-02-05 DIAGNOSIS — R319 Hematuria, unspecified: Secondary | ICD-10-CM | POA: Diagnosis present

## 2023-02-05 DIAGNOSIS — R109 Unspecified abdominal pain: Secondary | ICD-10-CM

## 2023-02-05 DIAGNOSIS — R81 Glycosuria: Secondary | ICD-10-CM | POA: Diagnosis present

## 2023-02-05 DIAGNOSIS — N186 End stage renal disease: Secondary | ICD-10-CM

## 2023-02-05 DIAGNOSIS — E876 Hypokalemia: Secondary | ICD-10-CM | POA: Diagnosis present

## 2023-02-05 DIAGNOSIS — I5022 Chronic systolic (congestive) heart failure: Secondary | ICD-10-CM | POA: Diagnosis present

## 2023-02-05 DIAGNOSIS — I351 Nonrheumatic aortic (valve) insufficiency: Secondary | ICD-10-CM | POA: Diagnosis present

## 2023-02-05 DIAGNOSIS — B9689 Other specified bacterial agents as the cause of diseases classified elsewhere: Secondary | ICD-10-CM | POA: Diagnosis present

## 2023-02-05 DIAGNOSIS — T8571XD Infection and inflammatory reaction due to peritoneal dialysis catheter, subsequent encounter: Secondary | ICD-10-CM | POA: Diagnosis not present

## 2023-02-05 DIAGNOSIS — M3214 Glomerular disease in systemic lupus erythematosus: Secondary | ICD-10-CM | POA: Diagnosis not present

## 2023-02-05 DIAGNOSIS — R1084 Generalized abdominal pain: Principal | ICD-10-CM

## 2023-02-05 DIAGNOSIS — T361X5A Adverse effect of cephalosporins and other beta-lactam antibiotics, initial encounter: Secondary | ICD-10-CM | POA: Diagnosis not present

## 2023-02-05 DIAGNOSIS — N2581 Secondary hyperparathyroidism of renal origin: Secondary | ICD-10-CM | POA: Diagnosis present

## 2023-02-05 DIAGNOSIS — Z992 Dependence on renal dialysis: Secondary | ICD-10-CM | POA: Diagnosis not present

## 2023-02-05 DIAGNOSIS — D631 Anemia in chronic kidney disease: Secondary | ICD-10-CM | POA: Diagnosis present

## 2023-02-05 DIAGNOSIS — K658 Other peritonitis: Secondary | ICD-10-CM | POA: Diagnosis present

## 2023-02-05 DIAGNOSIS — G251 Drug-induced tremor: Secondary | ICD-10-CM | POA: Diagnosis not present

## 2023-02-05 DIAGNOSIS — K5900 Constipation, unspecified: Secondary | ICD-10-CM | POA: Diagnosis not present

## 2023-02-05 DIAGNOSIS — Z5329 Procedure and treatment not carried out because of patient's decision for other reasons: Secondary | ICD-10-CM | POA: Diagnosis not present

## 2023-02-05 DIAGNOSIS — Y841 Kidney dialysis as the cause of abnormal reaction of the patient, or of later complication, without mention of misadventure at the time of the procedure: Secondary | ICD-10-CM | POA: Diagnosis present

## 2023-02-05 DIAGNOSIS — R809 Proteinuria, unspecified: Secondary | ICD-10-CM | POA: Diagnosis present

## 2023-02-05 DIAGNOSIS — T8571XA Infection and inflammatory reaction due to peritoneal dialysis catheter, initial encounter: Secondary | ICD-10-CM | POA: Diagnosis not present

## 2023-02-05 DIAGNOSIS — Z79899 Other long term (current) drug therapy: Secondary | ICD-10-CM | POA: Diagnosis not present

## 2023-02-05 DIAGNOSIS — R569 Unspecified convulsions: Secondary | ICD-10-CM | POA: Diagnosis present

## 2023-02-05 DIAGNOSIS — Z888 Allergy status to other drugs, medicaments and biological substances status: Secondary | ICD-10-CM | POA: Diagnosis not present

## 2023-02-05 DIAGNOSIS — Z83438 Family history of other disorder of lipoprotein metabolism and other lipidemia: Secondary | ICD-10-CM | POA: Diagnosis not present

## 2023-02-05 LAB — BASIC METABOLIC PANEL
Anion gap: 12 (ref 5–15)
BUN: 45 mg/dL — ABNORMAL HIGH (ref 6–20)
CO2: 25 mmol/L (ref 22–32)
Calcium: 7.2 mg/dL — ABNORMAL LOW (ref 8.9–10.3)
Chloride: 100 mmol/L (ref 98–111)
Creatinine, Ser: 13.58 mg/dL — ABNORMAL HIGH (ref 0.44–1.00)
GFR, Estimated: 4 mL/min — ABNORMAL LOW (ref >60)
Glucose, Bld: 88 mg/dL (ref 70–99)
Potassium: 4.4 mmol/L (ref 3.5–5.1)
Sodium: 137 mmol/L (ref 135–145)

## 2023-02-05 LAB — CBC WITH DIFFERENTIAL/PLATELET
Abs Immature Granulocytes: 0.28 10*3/uL — ABNORMAL HIGH (ref 0.00–0.07)
Basophils Absolute: 0 10*3/uL (ref 0.0–0.1)
Basophils Relative: 0 %
Eosinophils Absolute: 0 10*3/uL (ref 0.0–0.5)
Eosinophils Relative: 0 %
HCT: 24.4 % — ABNORMAL LOW (ref 36.0–46.0)
Hemoglobin: 7.6 g/dL — ABNORMAL LOW (ref 12.0–15.0)
Immature Granulocytes: 2 %
Lymphocytes Relative: 3 %
Lymphs Abs: 0.4 10*3/uL — ABNORMAL LOW (ref 0.7–4.0)
MCH: 26 pg (ref 26.0–34.0)
MCHC: 31.1 g/dL (ref 30.0–36.0)
MCV: 83.6 fL (ref 80.0–100.0)
Monocytes Absolute: 0.6 10*3/uL (ref 0.1–1.0)
Monocytes Relative: 5 %
Neutro Abs: 12.3 10*3/uL — ABNORMAL HIGH (ref 1.7–7.7)
Neutrophils Relative %: 90 %
Platelets: 145 10*3/uL — ABNORMAL LOW (ref 150–400)
RBC: 2.92 MIL/uL — ABNORMAL LOW (ref 3.87–5.11)
RDW: 15.6 % — ABNORMAL HIGH (ref 11.5–15.5)
WBC: 13.6 10*3/uL — ABNORMAL HIGH (ref 4.0–10.5)
nRBC: 0 % (ref 0.0–0.2)

## 2023-02-05 LAB — CULTURE, BLOOD (ROUTINE X 2)

## 2023-02-05 LAB — HIV ANTIBODY (ROUTINE TESTING W REFLEX): HIV Screen 4th Generation wRfx: NONREACTIVE

## 2023-02-05 MED ORDER — DELFLEX-LC/1.5% DEXTROSE 344 MOSM/L IP SOLN
Freq: Once | INTRAPERITONEAL | Status: DC
  Filled 2023-02-05: qty 3000

## 2023-02-05 MED ORDER — CALCITRIOL 0.25 MCG PO CAPS
0.5000 ug | ORAL_CAPSULE | Freq: Every day | ORAL | Status: DC
  Administered 2023-02-05 – 2023-02-08 (×4): 0.5 ug via ORAL
  Filled 2023-02-05 (×4): qty 2

## 2023-02-05 MED ORDER — CALCIUM ACETATE (PHOS BINDER) 667 MG PO CAPS
667.0000 mg | ORAL_CAPSULE | Freq: Three times a day (TID) | ORAL | Status: DC
  Administered 2023-02-05 – 2023-02-08 (×9): 667 mg via ORAL
  Filled 2023-02-05 (×11): qty 1

## 2023-02-05 MED ORDER — CHLORHEXIDINE GLUCONATE CLOTH 2 % EX PADS
6.0000 | MEDICATED_PAD | Freq: Every day | CUTANEOUS | Status: DC
  Administered 2023-02-05 – 2023-02-08 (×4): 6 via TOPICAL

## 2023-02-05 NOTE — Progress Notes (Signed)
[facility]  PROGRESS NOTE   Subjective:   Patient seen at bedside.  Feels much better this morning. Patient's mother at bedside. Had fevers last night.  Presently the temperature is around 99 F. She was given a dose of vancomycin and is presently on cefepime.  Objective:  Vital signs: Blood pressure (!) 133/102, pulse (!) 110, temperature 99.4 F (37.4 C), temperature source Oral, resp. rate 16, height 5\' 6"  (1.676 m), weight 50.8 kg, SpO2 100 %.  Intake/Output Summary (Last 24 hours) at 02/05/2023 1218 Last data filed at 02/05/2023 0100 Gross per 24 hour  Intake 526.25 ml  Output --  Net 526.25 ml   Filed Weights   02/04/23 0809  Weight: 50.8 kg     Physical Exam: General:  No acute distress  Head:  Normocephalic, atraumatic. Moist oral mucosal membranes  Eyes:  Anicteric  Neck:  Supple  Lungs:   Clear to auscultation, normal effort  Heart:  S1S2 no rubs  Abdomen:   Soft, nontender, bowel sounds present  Extremities:  peripheral edema.  Neurologic:  Awake, alert, following commands  Skin:  No lesions  Access:     Basic Metabolic Panel: Recent Labs  Lab 02/04/23 0756 02/04/23 1702 02/05/23 0404  NA 136 137 137  K 2.6* 3.5 4.4  CL 99 102 100  CO2 28 27 25   GLUCOSE 94 90 88  BUN 34* 38* 45*  CREATININE 12.64* 13.00* 13.58*  CALCIUM 7.7* 7.2* 7.2*  MG 1.5* 1.9  --    GFR: Estimated Creatinine Clearance: 5.2 mL/min (A) (by C-G formula based on SCr of 13.58 mg/dL (H)).  Liver Function Tests: Recent Labs  Lab 02/04/23 0756  AST 20  ALT 14  ALKPHOS 58  BILITOT 0.6  PROT 5.1*  ALBUMIN 1.9*   Recent Labs  Lab 02/04/23 0756  LIPASE 64*   No results for input(s): "AMMONIA" in the last 168 hours.  CBC: Recent Labs  Lab 02/04/23 0756 02/05/23 0404  WBC 4.6 13.6*  NEUTROABS 3.5 12.3*  HGB 7.3* 7.6*  HCT 23.3* 24.4*  MCV 83.2 83.6  PLT 203 145*     HbA1C: No results found for: "HGBA1C"  Urinalysis: Recent Labs     02/04/23 1017  COLORURINE SPECIMEN/CONTAINER TYPE INAPPROPRIATE FOR ORDERED TEST, UNABLE TO PERFORM*  LABSPEC SPECIMEN/CONTAINER TYPE INAPPROPRIATE FOR ORDERED TEST, UNABLE TO PERFORM  PHURINE SPECIMEN/CONTAINER TYPE INAPPROPRIATE FOR ORDERED TEST, UNABLE TO PERFORM  GLUCOSEU SPECIMEN/CONTAINER TYPE INAPPROPRIATE FOR ORDERED TEST, UNABLE TO PERFORM*  HGBUR SPECIMEN/CONTAINER TYPE INAPPROPRIATE FOR ORDERED TEST, UNABLE TO PERFORM*  BILIRUBINUR SPECIMEN/CONTAINER TYPE INAPPROPRIATE FOR ORDERED TEST, UNABLE TO PERFORM*  KETONESUR SPECIMEN/CONTAINER TYPE INAPPROPRIATE FOR ORDERED TEST, UNABLE TO PERFORM*  PROTEINUR SPECIMEN/CONTAINER TYPE INAPPROPRIATE FOR ORDERED TEST, UNABLE TO PERFORM*  NITRITE SPECIMEN/CONTAINER TYPE INAPPROPRIATE FOR ORDERED TEST, UNABLE TO PERFORM*  LEUKOCYTESUR SPECIMEN/CONTAINER TYPE INAPPROPRIATE FOR ORDERED TEST, UNABLE TO PERFORM*      Imaging: CT ABDOMEN PELVIS WO CONTRAST  Result Date: 02/04/2023 CLINICAL DATA:  Abdominal pain. EXAM: CT ABDOMEN AND PELVIS WITHOUT CONTRAST TECHNIQUE: Multidetector CT imaging of the abdomen and pelvis was performed following the standard protocol without IV contrast. RADIATION DOSE REDUCTION: This exam was performed according to the departmental dose-optimization program which includes automated exposure control, adjustment of the mA and/or kV according to patient size and/or use of iterative reconstruction technique. COMPARISON:  08/22/2021 FINDINGS: Lower chest: Unremarkable. Hepatobiliary: No suspicious focal abnormality in the liver on this study without intravenous contrast. There is no evidence for gallstones,  gallbladder wall thickening, or pericholecystic fluid. No intrahepatic or extrahepatic biliary dilation. Pancreas: No focal mass lesion. No dilatation of the main duct. No intraparenchymal cyst. No peripancreatic edema. Spleen: No splenomegaly. No focal mass lesion. Adrenals/Urinary Tract: No adrenal nodule or mass. Kidneys  unremarkable. No evidence for hydroureter. The urinary bladder appears normal for the degree of distention. Stomach/Bowel: Stomach is unremarkable. No gastric wall thickening. No evidence of outlet obstruction. Duodenum is normally positioned as is the ligament of Treitz. No small bowel wall thickening. No small bowel dilatation. The appendix is normal. No gross colonic mass. No colonic wall thickening. Vascular/Lymphatic: No abdominal aortic aneurysm. No abdominal aortic atherosclerotic calcification. There is no gastrohepatic or hepatoduodenal ligament lymphadenopathy. No retroperitoneal or mesenteric lymphadenopathy. No pelvic sidewall lymphadenopathy. Reproductive: Unremarkable. Other: Moderate volume fluid is identified in the peritoneal cavity of the abdomen and pelvis with scattered foci of intraperitoneal free gas. These findings are presumably related to the patient's peritoneal dialysis with peritoneal dialysis catheter seen coiled in the posterior left pelvis. Musculoskeletal: No worrisome lytic or sclerotic osseous abnormality. IMPRESSION: 1. No acute findings in the abdomen or pelvis. Specifically, no findings to explain the patient's history of abdominal pain. 2. Moderate volume fluid in the peritoneal cavity of the abdomen and pelvis with scattered foci of intraperitoneal free gas. These findings are presumably related to the patient's peritoneal dialysis with peritoneal dialysis catheter seen coiled in the posterior left pelvis. Electronically Signed   By: Kennith Center M.D.   On: 02/04/2023 09:31     Medications:    ceFEPime (MAXIPIME) IV 1 g (02/05/23 4166)   dialysis solution 1.5% low-MG/low-CA      Chlorhexidine Gluconate Cloth  6 each Topical Daily   gentamicin ointment   Topical Daily   heparin  5,000 Units Subcutaneous Q8H   irbesartan  75 mg Oral Daily   lacosamide  200 mg Oral BID   levETIRAcetam  500 mg Oral Daily   pantoprazole  40 mg Oral Daily   predniSONE  20 mg Oral Q  breakfast   sodium chloride flush  3 mL Intravenous Q12H   vancomycin variable dose per unstable renal function (pharmacist dosing)   Does not apply See admin instructions    Assessment/ Plan:     22 year old female with hypertension, lupus, history of pericardial tamponade, congestive heart failure, seizures and end-stage renal disease secondary to lupus erythematosus and has been on peritoneal dialysis for the last 2 years.  She is now being admitted with history of diffuse abdominal pain.  She had a CT scan of the abdomen and pelvis done which was negative.  Patient says she had 2 episodes of possible peritonitis and was treated with IV antibiotics previously. The peritoneal dialysis fluid was drawn this morning which was cloudy.    ESRD: Patient has been on peritoneal dialysis.  Did not receive any treatment last night.  Will resume treatment tonight with 1.5% dextrose.Marland Kitchen   ANEMIA: Will continue to monitor and follow protocols.   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   HTN/VOL: Blood pressure is better today.  Will need to continue clonidine, Entresto, Aldactone and carvedilol.   Sepsis/peritonitis: PD catheter.  PD fluid shows a cell count of over 1000.  Cultures are negative so far.  Will continue the vancomycin and cefepime.  Pharmacy to adjust the dosage.  Will continue to monitor Vanco troughs.   Hypokalemia: Supplemented yesterday.  .  Systemic lupus erythematosus: Continue the prednisone as ordered.  Will follow closely. Spoke to the family at bedside.  Answered all their questions to their satisfaction.    LOS: 0 Lorain Childes, MD [facility] 5/12/202412:18 PM

## 2023-02-05 NOTE — Hospital Course (Addendum)
Taken from H&P.  Melinda Steele is a 22 y.o. 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   History of suspected PD associated peritonitis in November 2023 and then March 2024, with both cultures demonstrating no growth.  Treated empirically with intraperitoneal ceftazidime and vancomycin.   ED course.  She was mildly tachycardic, maximum temperature recorded at 102.7.  Labs pertinent for hemoglobin of 7.3, WBC of 4.6, potassium 2.6, magnesium 1.5,  BUN 34, calcium 7.7, albumin 1.9, lipase 64, GFR 4.  Urinalysis was not done due to inappropriate container. CT of the abdomen was obtained that demonstrated no acute findings, however moderate volume fluid in the peritoneal abdomen with scattered foci of intraperitoneal free gas consistent with peritoneal dialysis.  Peritoneal fluid was sent to lab and she was started on empiric antibiotics with cefepime and vancomycin.  5/12: Afebrile this morning.  Persistent mild tachycardia.  Labs with new development of leukocytosis at 13.6, with increased neutrophil at 12.3, hemoglobin stable at 7.6.  Potassium 4.4 after repletion.  Preliminary blood cultures negative in 12 hours, peritoneal fluid with cell count of 1620, 94% neutrophil, preliminary cultures with rare gram-negative rods. Repeat UA and urine culture is not appropriate at this time as she is already on antibiotics. ID was consulted  5/13: Patient was having fever at 100.2 with mild tachycardia in low 100 and blood pressure of 144/102.  Patient was recently started on metoprolol which was added. Also concerning of bilateral hand tremors, discussed with nephrology as she is high risk for any seizure with cefepime.  They might start her on intraperitoneal  ceftriaxone, ID was also consulted.  Still pending peritoneal fluid cultures.

## 2023-02-05 NOTE — Progress Notes (Signed)
Progress Note   Patient: Melinda Steele JXB:[id number] DOB: 03-27-2001 DOA: 02/04/2023     0 DOS: the patient was seen and examined on 02/05/2023   Brief hospital course: Taken from H&P.  Melinda Steele is a 22 y.o. 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   History of suspected PD associated peritonitis in November 2023 and then March 2024, with both cultures demonstrating no growth.  Treated empirically with intraperitoneal ceftazidime and vancomycin.   ED course.  She was mildly tachycardic, maximum temperature recorded at 102.7.  Labs pertinent for hemoglobin of 7.3, WBC of 4.6, potassium 2.6, magnesium 1.5,  BUN 34, calcium 7.7, albumin 1.9, lipase 64, GFR 4.  Urinalysis was not done due to inappropriate container. CT of the abdomen was obtained that demonstrated no acute findings, however moderate volume fluid in the peritoneal abdomen with scattered foci of intraperitoneal free gas consistent with peritoneal dialysis.  Peritoneal fluid was sent to lab and she was started on empiric antibiotics with cefepime and vancomycin.  5/12: Afebrile this morning.  Persistent mild tachycardia.  Labs with new development of leukocytosis at 13.6, with increased neutrophil at 12.3, hemoglobin stable at 7.6.  Potassium 4.4 after repletion.  Preliminary blood cultures negative in 12 hours, peritoneal fluid with cell count of 1620, 94% neutrophil, preliminary cultures with rare gram-negative rods. Repeat UA and urine culture is not appropriate at this time as she is already on antibiotics. ID was consulted    Assessment and Plan: * Peritoneal dialysis-associated peritonitis [facility]) Patient is presenting with sudden onset abdominal pain that is severe in  nature.  Primary differential at this time is peritonitis secondary to PD.  Previous history of such with negative cultures treated empirically with ceftazidime and vancomycin via PD catheter. Preliminary peritoneal fluid cultures with gram-negative rods.  Elevated cell count above 1000 - Continue IV cefepime and vancomycin - Narrow per culture data - ID was consulted-will see the patient tomorrow but normally nephrology manages intraperitoneal antibiotics if needed -Continue with supportive care  Electrolyte abnormality Hypokalemia with hypomagnesemia, which has been resolved with repletion -Continue to monitor and replete as needed  ESRD (end-stage renal disease) due to SLE [facility]) Patient is currently on peritoneal dialysis.  - Nephrology consulted; -will continue PD  Chronic HFrEF (heart failure with reduced ejection fraction) (HCC) With recovered EF, last 50-55%.  Currently euvolemic on exam.  - Continue home GDMT - Hold home diuretics given risk for hypovolemia in the setting of peritonitis  Systemic lupus erythematosus (HCC) History of SLE diagnosed in 2017 with multiple complications including lupus nephritis, pericarditis with pleural effusion causing tamponade, vasculitis.  Follows with [facility] rheumatology.  - Continue home immunosuppressive therapy  Seizures (HCC) - Continue home Vimpat  Anemia of chronic disease Chronic anemia with hemoglobin averaging between 7.5 - 9.  Hemoglobin today is 7.3.  No evidence of bleeding on exam.  Previous history of hemolytic anemia, however no indication this is occurring at this time.  -Daily CBC   Subjective: Patient was looking depressed but denied when asked.  Denies any suicidal thoughts.  Becoming little snappy in between.  Denies any pain or other complaints.  Physical Exam: Vitals:   02/04/23 2220 02/05/23 0204 02/05/23 0608 02/05/23 0858  BP:  (!) 128/98 (!) 131/97 (!) 133/102  Pulse:  (!) 119 Marland Kitchen)  116 (!) 110  Resp:   16 16   Temp: (!) 100.9 F (38.3 C) 99.4 F (37.4 C) 98.4 F (36.9 C) 99.4 F (37.4 C)  TempSrc: Oral Oral  Oral  SpO2:  100% 100% 100%  Weight:      Height:       General.  Thin built young lady, in no acute distress. Pulmonary.  Lungs clear bilaterally, normal respiratory effort. CV.  Regular rate and rhythm, no JVD, rub or murmur. Abdomen.  Soft, nontender, nondistended, BS positive. CNS.  Alert and oriented .  No focal neurologic deficit. Extremities.  No edema, no cyanosis, pulses intact and symmetrical. Psychiatry.  Judgment and insight appears normal.   Data Reviewed: Prior data reviewed  Family Communication: Discussed with patient  Disposition: Status is: Observation The patient will require care spanning > 2 midnights and should be moved to inpatient because: Severity of illness  Planned Discharge Destination: Home  DVT prophylaxis.  Subcu heparin Time spent: 45 minutes  This record has been created using Conservation officer, historic buildings. Errors have been sought and corrected,but may not always be located. Such creation errors do not reflect on the standard of care.   Author: Arnetha Courser, MD 02/05/2023 1:33 PM  For on call review [email or web address].

## 2023-02-05 NOTE — Plan of Care (Signed)

## 2023-02-05 NOTE — Progress Notes (Signed)
     Peritoneal Dialysis Treatment Initiation Note    Tx started  2130 Pre Treatment Weight:49.6kg   Consent signed and in chart.  PD treatment initiated via aseptic technique.    Patient is awake and alert. No complaints of pain.     Dressing changed PD exit site clean, dry and intact, Gentamicin applied      Hand-off given to the patient's nurse. Judithann Sauger RN Education provided to dept staff  regarding PD machine and how  to contact tech support if machine  alarms.      Adah Salvage RN Kidney Dialysis Unit

## 2023-02-06 DIAGNOSIS — T8571XA Infection and inflammatory reaction due to peritoneal dialysis catheter, initial encounter: Secondary | ICD-10-CM | POA: Diagnosis not present

## 2023-02-06 DIAGNOSIS — M3214 Glomerular disease in systemic lupus erythematosus: Secondary | ICD-10-CM | POA: Diagnosis not present

## 2023-02-06 DIAGNOSIS — N186 End stage renal disease: Secondary | ICD-10-CM | POA: Diagnosis not present

## 2023-02-06 DIAGNOSIS — R1084 Generalized abdominal pain: Secondary | ICD-10-CM | POA: Diagnosis not present

## 2023-02-06 LAB — PATHOLOGIST SMEAR REVIEW

## 2023-02-06 MED ORDER — NEPRO/CARBSTEADY PO LIQD
237.0000 mL | Freq: Three times a day (TID) | ORAL | Status: DC
  Administered 2023-02-07: 237 mL via ORAL

## 2023-02-06 MED ORDER — PIPERACILLIN-TAZOBACTAM IN DEX 2-0.25 GM/50ML IV SOLN
2.2500 g | Freq: Three times a day (TID) | INTRAVENOUS | Status: DC
  Administered 2023-02-06 – 2023-02-08 (×6): 2.25 g via INTRAVENOUS
  Filled 2023-02-06 (×7): qty 50

## 2023-02-06 MED ORDER — RENA-VITE PO TABS
1.0000 | ORAL_TABLET | Freq: Every day | ORAL | Status: DC
  Administered 2023-02-07 – 2023-02-08 (×2): 1 via ORAL
  Filled 2023-02-06 (×2): qty 1

## 2023-02-06 MED ORDER — CLONIDINE HCL 0.1 MG PO TABS
0.2000 mg | ORAL_TABLET | Freq: Four times a day (QID) | ORAL | Status: DC | PRN
  Administered 2023-02-06 – 2023-02-08 (×2): 0.2 mg via ORAL
  Filled 2023-02-06 (×2): qty 2

## 2023-02-06 MED ORDER — METOPROLOL SUCCINATE ER 25 MG PO TB24
25.0000 mg | ORAL_TABLET | Freq: Every day | ORAL | Status: DC
  Administered 2023-02-06 – 2023-02-08 (×3): 25 mg via ORAL
  Filled 2023-02-06 (×3): qty 1

## 2023-02-06 NOTE — TOC Initial Note (Signed)
Transition of Care [facility]) - Initial/Assessment Note    Patient Details  Name: Melinda Steele MRN: [id number] Date of Birth: Aug 29, 2001  Transition of Care [facility]) CM/SW Contact:    Margarito Liner, LCSW Phone Number: 02/06/2023, 1:40 PM  Clinical Narrative:  Readmission prevention screen complete. CSW met with patient. No supports at bedside. CSW introduced role and explained that discharge planning would be discussed. PCP is at [facility] but she cannot remember their name. She currently has driving restrictions due to seizures  so her mother transports her to appointments. Pharmacies are the [facility] and [facility] in [place]. No issues obtaining medications. Patient lives with her mom. No home health or DME use prior to admission. No further concerns. CSW encouraged patient to contact CSW as needed. CSW will continue to follow patient for support and facilitate return home once stable. Her mother will transport her home at discharge.              Expected Discharge Plan: Home/Self Care Barriers to Discharge: Continued Medical Work up   Patient Goals and CMS Choice            Expected Discharge Plan and Services     Post Acute Care Choice: NA Living arrangements for the past 2 months: Single Family Home                                      Prior Living Arrangements/Services Living arrangements for the past 2 months: Single Family Home Lives with:: Parents Patient language and need for interpreter reviewed:: Yes Do you feel safe going back to the place where you live?: Yes      Need for Family Participation in Patient Care: Yes (Comment) Care giver support system in place?: Yes (comment)   Criminal Activity/Legal Involvement Pertinent to Current Situation/Hospitalization: No - Comment as needed  Activities of Daily Living Home Assistive Devices/Equipment: None ADL Screening (condition at time of admission) Patient's cognitive ability adequate to safely complete  daily activities?: Yes Is the patient deaf or have difficulty hearing?: No Does the patient have difficulty seeing, even when wearing glasses/contacts?: No Does the patient have difficulty concentrating, remembering, or making decisions?: No Patient able to express need for assistance with ADLs?: Yes Does the patient have difficulty dressing or bathing?: No Independently performs ADLs?: Yes (appropriate for developmental age) Does the patient have difficulty walking or climbing stairs?: No Weakness of Legs: None Weakness of Arms/Hands: None  Permission Sought/Granted                  Emotional Assessment Appearance:: Appears stated age Attitude/Demeanor/Rapport: Engaged Affect (typically observed): Accepting, Appropriate, Calm Orientation: : Oriented to Self, Oriented to Place, Oriented to  Time, Oriented to Situation Alcohol / Substance Use: Not Applicable Psych Involvement: No (comment)  Admission diagnosis:  Generalized abdominal pain [R10.84] Peritoneal dialysis-associated peritonitis (HCC) [T85.71XA] ESRD on peritoneal dialysis (HCC) [N18.6, Z99.2] Patient Active Problem List   Diagnosis Date Noted   Generalized abdominal pain 02/05/2023   ESRD on peritoneal dialysis (HCC) 02/05/2023   Peritoneal dialysis-associated peritonitis (HCC) 02/04/2023   ESRD (end-stage renal disease) due to SLE (HCC) 02/04/2023   Electrolyte abnormality 02/04/2023   Mood disorder (HCC) 01/11/2023   Iron deficiency 08/21/2022   HFrEF (heart failure with reduced ejection fraction) (HCC) 12/16/2021   Moderate protein-calorie malnutrition (HCC) 10/07/2021   Secondary hyperparathyroidism (HCC) 09/06/2021   Multifocal pneumonia  Pericardial effusion    Sepsis (HCC) 08/21/2021   Acute renal failure superimposed on chronic kidney disease (HCC) 08/21/2021   Chronic HFrEF (heart failure with reduced ejection fraction) (HCC) 08/21/2021   Hypertension 08/21/2021   Hyperlipidemia 08/21/2021    Seizures (HCC) 08/21/2021   Chronic hemolytic anemia (HCC) 08/21/2021   Metabolic acidosis, normal anion gap (NAG) 08/21/2021   Hidradenitis suppurativa 02/13/2021   Acute respiratory failure with hypoxia (HCC) 12/09/2020   Anemia of chronic disease 10/31/2020   PRES (posterior reversible encephalopathy syndrome) 10/31/2020   Systemic lupus erythematosus (HCC) 09/18/2019   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) 09/16/2016   PCP:  [facility] Pharmacy:   [facility] [address] [place] - [address] [place] [address] Phone: [phone] Fax: [phone]     Social Determinants of Health (SDOH) Social History: SDOH Screenings   Food Insecurity: Patient Declined (02/04/2023)  Housing: Low Risk  (02/04/2023)  Tobacco Use: Low Risk  (02/04/2023)   SDOH Interventions:     Readmission Risk Interventions    02/06/2023    1:38 PM  Readmission Risk Prevention Plan  Transportation Screening Complete  PCP or Specialist Appt within 3-5 Days Complete  Social Work Consult for Recovery Care Planning/Counseling Complete  Palliative Care Screening Not Applicable  Medication Review [employer]) Complete

## 2023-02-06 NOTE — Progress Notes (Signed)
Progress Note   Patient: Melinda Steele EAV:[id number] DOB: 2001-05-06 DOA: 02/04/2023     1 DOS: the patient was seen and examined on 02/06/2023   Brief hospital course: Taken from H&P.  Yvonnie Barbie is a 22 y.o. 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   History of suspected PD associated peritonitis in November 2023 and then March 2024, with both cultures demonstrating no growth.  Treated empirically with intraperitoneal ceftazidime and vancomycin.   ED course.  She was mildly tachycardic, maximum temperature recorded at 102.7.  Labs pertinent for hemoglobin of 7.3, WBC of 4.6, potassium 2.6, magnesium 1.5,  BUN 34, calcium 7.7, albumin 1.9, lipase 64, GFR 4.  Urinalysis was not done due to inappropriate container. CT of the abdomen was obtained that demonstrated no acute findings, however moderate volume fluid in the peritoneal abdomen with scattered foci of intraperitoneal free gas consistent with peritoneal dialysis.  Peritoneal fluid was sent to lab and she was started on empiric antibiotics with cefepime and vancomycin.  5/12: Afebrile this morning.  Persistent mild tachycardia.  Labs with new development of leukocytosis at 13.6, with increased neutrophil at 12.3, hemoglobin stable at 7.6.  Potassium 4.4 after repletion.  Preliminary blood cultures negative in 12 hours, peritoneal fluid with cell count of 1620, 94% neutrophil, preliminary cultures with rare gram-negative rods. Repeat UA and urine culture is not appropriate at this time as she is already on antibiotics. ID was consulted  5/13: Patient was having fever at 100.2 with mild tachycardia in low 100 and blood pressure of 144/102.  Patient was recently started on metoprolol which was  added. Also concerning of bilateral hand tremors, discussed with nephrology as she is high risk for any seizure with cefepime.  They might start her on intraperitoneal ceftriaxone, ID was also consulted.  Still pending peritoneal fluid cultures.   Assessment and Plan: * Peritoneal dialysis-associated peritonitis [facility]) Patient is presenting with sudden onset abdominal pain that is severe in nature.  Primary differential at this time is peritonitis secondary to PD.  Previous history of such with negative cultures treated empirically with ceftazidime and vancomycin via PD catheter. Preliminary peritoneal fluid cultures with gram-negative rods.  Elevated cell count above 1000-final culture still pending - Continue IV cefepime and vancomycin-message sent to ID and nephrology as intraperitoneal ceftriaxone might be more beneficial. - Narrow per culture data - ID was consulted- -Continue with supportive care  Electrolyte abnormality Hypokalemia with hypomagnesemia, which has been resolved with repletion -Continue to monitor and replete as needed  ESRD (end-stage renal disease) due to SLE [facility]) Patient is currently on peritoneal dialysis.  - Nephrology consulted; -will continue PD  Chronic HFrEF (heart failure with reduced ejection fraction) (HCC) With recovered EF, last 50-55%.  Currently euvolemic on exam.  - Continue home GDMT - Hold home diuretics given risk for hypovolemia in the setting of peritonitis  Systemic lupus erythematosus (HCC) History of SLE diagnosed in 2017 with multiple complications including lupus nephritis, pericarditis with pleural effusion causing tamponade, vasculitis.  Follows with [facility] rheumatology.  - Continue home immunosuppressive therapy  Seizures (HCC) - Continue home Vimpat  Anemia of chronic disease Chronic anemia with hemoglobin averaging between 7.5 - 9.  Hemoglobin today is 7.3.  No evidence of bleeding on exam.  Previous history of hemolytic anemia,  however no indication this is occurring at this time.  -Daily CBC   Subjective: Patient continued to have abdominal pain.  Did develop mild bilateral hand tremors.  Physical Exam: Vitals:   02/06/23 0400 02/06/23 0742 02/06/23 0941 02/06/23 1000  BP: (!) 129/97 (!) 144/102 127/88   Pulse: (!) 109 (!) 106 (!) 107   Resp: 18 16    Temp: 98.1 F (36.7 C) 100.2 F (37.9 C) 99.6 F (37.6 C)   TempSrc:  Oral    SpO2: 100% 92%    Weight:    49.9 kg  Height:       General.  Thin built young lady, in no acute distress. Pulmonary.  Lungs clear bilaterally, normal respiratory effort. CV.  Regular rate and rhythm, no JVD, rub or murmur. Abdomen.  Soft, nontender, nondistended, BS positive. CNS.  Alert and oriented .  No focal neurologic deficit. Extremities.  No edema, no cyanosis, pulses intact and symmetrical. Psychiatry.  Judgment and insight appears normal.   Data Reviewed: Prior data reviewed  Family Communication: Discussed with patient and talked with mother on phone  Disposition: Status is: Observation The patient will require care spanning > 2 midnights and should be moved to inpatient because: Severity of illness  Planned Discharge Destination: Home  DVT prophylaxis.  Subcu heparin Time spent: 42 minutes  This record has been created using Conservation officer, historic buildings. Errors have been sought and corrected,but may not always be located. Such creation errors do not reflect on the standard of care.   Author: Arnetha Courser, MD 02/06/2023 2:14 PM  For on call review [email or web address].

## 2023-02-06 NOTE — Progress Notes (Signed)
     Peritoneal Dialysis Treatment Disconnection  Note     Post Treatment Weight:49.9kg   Consent signed and in chart.  PD treatment disconnected via aseptic technique.    Patient is awake and alert. No complaints of pain.     Dressing clean, dry, intact    Hand-off given to the patient's nurse. Jesse Sans RN     Adah Salvage RN Kidney Dialysis Unit

## 2023-02-06 NOTE — Progress Notes (Signed)
     Peritoneal Dialysis Treatment Initiation Note     Pre Treatment Weight:49.6kg   Consent signed and in chart.  PD treatment initiated via aseptic technique.    Patient is awake and alert. No complaints of pain.     Dressing changed PD exit site clean, dry and intact, Gentamicin applied      Hand-off given to the patient's nurse. Burna Forts RN Education provided to dept staff  regarding PD machine and how  to contact tech support if machine  alarms.      Adah Salvage RN Kidney Dialysis Unit

## 2023-02-06 NOTE — Consult Note (Signed)
Pharmacy Antibiotic Note  Melinda Steele is a 22 y.o. female admitted on 02/04/2023 with  peritonitis .  Pharmacy has been consulted for Vancomycin and Cefepime IV dosing. Patient on peritoneal dialysis. Reportedly with two previous cases of peritonitis (Dec '23 and March '24), reviewing care everywhere cultures where no growth.    Today, 02/06/2023 Day #3 vancomycin and cefepime Renal - ESRD on PD WBC 13.6 on last check 5/12 Afebrile 5/11 Peritoneal fluid: 1620 Nuc cells with 94% neutrophils Culture: GNR - identification pending  Vancomycin 1gm x 1 given 5/11 at 12:59  Plan: Culture with GNR if vancomycin to continue will check vancomycin random level 5/14 Cefepime 1gm IV q 24hrs  Follow cultures and route of administration  Height: 5\' 6"  (167.6 cm) Weight: 49.6 kg (109 lb 5.6 oz) IBW/kg (Calculated) : 59.3  Temp (24hrs), Avg:99.4 F (37.4 C), Min:98.1 F (36.7 C), Max:100.3 F (37.9 C)  Recent Labs  Lab 02/04/23 0756 02/04/23 1702 02/05/23 0404  WBC 4.6  --  13.6*  CREATININE 12.64* 13.00* 13.58*     Estimated Creatinine Clearance: 5.1 mL/min (A) (by C-G formula based on SCr of 13.58 mg/dL (H)).    Allergies  Allergen Reactions   Amlodipine Swelling    Antimicrobials this admission: 5/11 Cefepime >>  5/11 Vancomycin >>    Microbiology results: 5/11: Peritoneal fluid culture: GNR  Thank you for allowing pharmacy to be a part of this patient's care.  Juliette Alcide, PharmD, BCPS, BCIDP Work Cell: [phone] 02/06/2023 12:23 PM

## 2023-02-06 NOTE — Assessment & Plan Note (Signed)
Patient is presenting with sudden onset abdominal pain that is severe in nature.  Primary differential at this time is peritonitis secondary to PD.  Previous history of such with negative cultures treated empirically with ceftazidime and vancomycin via PD catheter. Preliminary peritoneal fluid cultures with gram-negative rods.  Elevated cell count above 1000-final culture still pending - Continue IV cefepime and vancomycin-message sent to ID and nephrology as intraperitoneal ceftriaxone might be more beneficial. - Narrow per culture data - ID was consulted- -Continue with supportive care

## 2023-02-06 NOTE — Assessment & Plan Note (Signed)
Hypokalemia with hypomagnesemia.  - Potassium 60 mEq total ordered today - Magnesium sulfate 2 g - Repeat BMP and magnesium in the evening 

## 2023-02-06 NOTE — Progress Notes (Signed)
[facility]  PROGRESS NOTE   Subjective:   Peritoneal dialysis last night.  Tmax 100.3 Patient with tremors after cefepime administration.   Objective:  Vital signs: Blood pressure 127/88, pulse (!) 107, temperature 99.6 F (37.6 C), resp. rate 16, height 5\' 6"  (1.676 m), weight 49.9 kg, SpO2 92 %.  Intake/Output Summary (Last 24 hours) at 02/06/2023 1610 Last data filed at 02/06/2023 1200 Gross per 24 hour  Intake 580 ml  Output 40 ml  Net 540 ml    Filed Weights   02/04/23 0809 02/05/23 2233 02/06/23 1000  Weight: 50.8 kg 49.6 kg 49.9 kg     Physical Exam: General:  No acute distress  Head:  Normocephalic, atraumatic. Moist oral mucosal membranes  Eyes:  Anicteric  Neck:  Supple  Lungs:   Clear to auscultation, normal effort  Heart:  regular  Abdomen:   Soft, nontender, bowel sounds present  Extremities:  peripheral edema.  Neurologic:  +resting tremor  Skin:  No lesions  Access: PD catheter    Basic Metabolic Panel: Recent Labs  Lab 02/04/23 0756 02/04/23 1702 02/05/23 0404  NA 136 137 137  K 2.6* 3.5 4.4  CL 99 102 100  CO2 28 27 25   GLUCOSE 94 90 88  BUN 34* 38* 45*  CREATININE 12.64* 13.00* 13.58*  CALCIUM 7.7* 7.2* 7.2*  MG 1.5* 1.9  --     GFR: Estimated Creatinine Clearance: 5.1 mL/min (A) (by C-G formula based on SCr of 13.58 mg/dL (H)).  Liver Function Tests: Recent Labs  Lab 02/04/23 0756  AST 20  ALT 14  ALKPHOS 58  BILITOT 0.6  PROT 5.1*  ALBUMIN 1.9*    Recent Labs  Lab 02/04/23 0756  LIPASE 64*    No results for input(s): "AMMONIA" in the last 168 hours.  CBC: Recent Labs  Lab 02/04/23 0756 02/05/23 0404  WBC 4.6 13.6*  NEUTROABS 3.5 12.3*  HGB 7.3* 7.6*  HCT 23.3* 24.4*  MCV 83.2 83.6  PLT 203 145*      HbA1C: No results found for: "HGBA1C"  Urinalysis: Recent Labs    02/04/23 1017  COLORURINE SPECIMEN/CONTAINER TYPE INAPPROPRIATE FOR ORDERED TEST, UNABLE TO PERFORM*  LABSPEC  SPECIMEN/CONTAINER TYPE INAPPROPRIATE FOR ORDERED TEST, UNABLE TO PERFORM  PHURINE SPECIMEN/CONTAINER TYPE INAPPROPRIATE FOR ORDERED TEST, UNABLE TO PERFORM  GLUCOSEU SPECIMEN/CONTAINER TYPE INAPPROPRIATE FOR ORDERED TEST, UNABLE TO PERFORM*  HGBUR SPECIMEN/CONTAINER TYPE INAPPROPRIATE FOR ORDERED TEST, UNABLE TO PERFORM*  BILIRUBINUR SPECIMEN/CONTAINER TYPE INAPPROPRIATE FOR ORDERED TEST, UNABLE TO PERFORM*  KETONESUR SPECIMEN/CONTAINER TYPE INAPPROPRIATE FOR ORDERED TEST, UNABLE TO PERFORM*  PROTEINUR SPECIMEN/CONTAINER TYPE INAPPROPRIATE FOR ORDERED TEST, UNABLE TO PERFORM*  NITRITE SPECIMEN/CONTAINER TYPE INAPPROPRIATE FOR ORDERED TEST, UNABLE TO PERFORM*  LEUKOCYTESUR SPECIMEN/CONTAINER TYPE INAPPROPRIATE FOR ORDERED TEST, UNABLE TO PERFORM*       Imaging: No results found.   Medications:    dialysis solution 1.5% low-MG/low-CA     piperacillin-tazobactam (ZOSYN)  IV      calcitRIOL  0.5 mcg Oral Daily   calcium acetate  667 mg Oral TID WC   Chlorhexidine Gluconate Cloth  6 each Topical Daily   feeding supplement (NEPRO CARB STEADY)  237 mL Oral TID BM   gentamicin ointment   Topical Daily   heparin  5,000 Units Subcutaneous Q8H   irbesartan  75 mg Oral Daily   lacosamide  200 mg Oral BID   levETIRAcetam  500 mg Oral Daily   metoprolol succinate  25 mg Oral Daily   [  START ON 02/07/2023] multivitamin  1 tablet Oral QHS   pantoprazole  40 mg Oral Daily   predniSONE  20 mg Oral Q breakfast   sodium chloride flush  3 mL Intravenous Q12H    Assessment/ Plan:     22 year old female with hypertension, lupus, history of pericardial tamponade, congestive heart failure, seizures and end-stage renal disease secondary to lupus erythematosus and has been on peritoneal dialysis for the last 2 years.  She is now being admitted with history of diffuse abdominal pain.  She had a CT scan of the abdomen and pelvis done which was negative.  Patient says she had 2 episodes of possible  peritonitis and was treated with IV antibiotics previously. The peritoneal dialysis fluid was drawn this morning which was cloudy.    ESRD: Patient has been on peritoneal dialysis.  Did not receive any treatment last night.  Will resume treatment tonight with 1.5% dextrose  Anemia with chronic kidney disease: hemoglobin 7.6. Low threshold for ESA administration.   Secondary Hyperparathyroidism: with hypocalcemia - continue calcium acetate and calcitriol.  Hypertension with chronic kidney disease - irbesartan and metoprolol  Peritonitis: reaction to cefepime. Will transition to IV pip/tazo and discontinue vancomycin. Cultures with gram negative rods in peritoneal fluid on 02/04/23.    LOS: 1 Lamont Dowdy, MD [facility] 5/13/20244:10 PM

## 2023-02-06 NOTE — Progress Notes (Signed)
Oncall dialysis nurse notified dialysis bag leaking, stated someone would look when they come in

## 2023-02-07 DIAGNOSIS — I5022 Chronic systolic (congestive) heart failure: Secondary | ICD-10-CM | POA: Diagnosis not present

## 2023-02-07 DIAGNOSIS — N186 End stage renal disease: Secondary | ICD-10-CM | POA: Diagnosis not present

## 2023-02-07 DIAGNOSIS — M3214 Glomerular disease in systemic lupus erythematosus: Secondary | ICD-10-CM | POA: Diagnosis not present

## 2023-02-07 DIAGNOSIS — R1084 Generalized abdominal pain: Secondary | ICD-10-CM | POA: Diagnosis not present

## 2023-02-07 DIAGNOSIS — T8571XD Infection and inflammatory reaction due to peritoneal dialysis catheter, subsequent encounter: Secondary | ICD-10-CM

## 2023-02-07 LAB — BODY FLUID CULTURE W GRAM STAIN

## 2023-02-07 LAB — CULTURE, BLOOD (ROUTINE X 2)
Culture: NO GROWTH
Special Requests: ADEQUATE

## 2023-02-07 LAB — HEPATITIS B SURFACE ANTIGEN: Hepatitis B Surface Ag: NONREACTIVE

## 2023-02-07 MED ORDER — BISACODYL 10 MG RE SUPP
10.0000 mg | Freq: Once | RECTAL | Status: AC
  Administered 2023-02-07: 10 mg via RECTAL
  Filled 2023-02-07: qty 1

## 2023-02-07 MED ORDER — SENNOSIDES-DOCUSATE SODIUM 8.6-50 MG PO TABS
1.0000 | ORAL_TABLET | Freq: Two times a day (BID) | ORAL | Status: DC
  Administered 2023-02-07 – 2023-02-08 (×3): 1 via ORAL
  Filled 2023-02-07 (×4): qty 1

## 2023-02-07 NOTE — Progress Notes (Signed)
  Progress Note   Patient: Melinda Steele XBJ:[id number] DOB: 12-09-2000 DOA: 02/04/2023     2 DOS: the patient was seen and examined on 02/07/2023   Brief hospital course: Melinda Steele is a 22 y.o. 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 History of suspected PD associated peritonitis in November 2023 and then March 2024, with both cultures demonstrating no growth. Peritoneal fluid was sent to lab and she was started on empiric antibiotics with cefepime and vancomycin. Hospitalist called for admission.  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  Appreciate their insight and recommendations -currently on IV Zosyn, vancomycin discontinued  Hypokalemia, hypomagnesemia Questionably dietary in etiology, continue supplement as appropriate  ESRD (end-stage renal disease) due to SLE [facility]) Patient is currently on peritoneal dialysis -defer to nephrology, PD ongoing  Chronic HFrEF (heart failure with reduced ejection fraction) (HCC) EF, last 50-55%.  Currently euvolemic on exam.  Systemic lupus erythematosus (HCC) History of SLE diagnosed in 2017 with multiple complications including lupus nephritis, pericarditis with pleural effusion causing tamponade, vasculitis.  Follows with [facility] rheumatology. - Continue home immunosuppressive therapy  Seizures (HCC) - Continue Vimpat  Anemia of chronic disease/ESRD -Baseline hemoglobin around  8 -Continue to follow repeat labs  Subjective: Abdominal pain improving, constipation is patient's only complaint today otherwise denies nausea vomiting headache fever chills chest pain shortness of breath  Physical Exam: Vitals:   02/06/23 1706 02/06/23 1739 02/06/23 2000 02/06/23 2340  BP: (!) 150/110 (!) 157/113 (!) 150/104   Pulse: (!) 103 (!) 102 100   Resp: 17  15   Temp: 98.2 F (36.8 C)  99.6 F (37.6 C)   TempSrc:   Oral   SpO2: 100%  100%   Weight:    49.6 kg  Height:       General.  Thin built young lady, in no acute distress. Pulmonary.  Lungs clear bilaterally, normal respiratory effort. CV.  Regular rate and rhythm, no JVD, rub or murmur. Abdomen.  Soft, nontender, nondistended, BS positive. CNS.  Alert and oriented .  No focal neurologic deficit. Extremities.  No edema, no cyanosis, pulses intact and symmetrical.  Data Reviewed: Prior data reviewed  Family Communication: None present  Disposition: Status is: Inpatient -continues to require IV antibiotics, possible further intervention or procedure pending peritoneal dialysis catheter evaluation  DVT prophylaxis.  Subcu heparin Time spent: 45 minutes  Author: Carma Leaven DO 02/07/2023 7:11 AM  For on call review [email or web address].

## 2023-02-07 NOTE — Progress Notes (Signed)
     Peritoneal Dialysis Treatment Disconnection Note     Pre Treatment Weight:49.6kg Post tx wt53.3kg   Consent signed and in chart.  PD treatment disconnected aseptic technique.    Patient is awake and alert. No complaints of pain.          Hand-off given to the patient's nurse. Eboni Fogg-Kelly RN      Adah Salvage RN [facility]

## 2023-02-07 NOTE — Progress Notes (Signed)
[facility]  PROGRESS NOTE   Subjective:   Peritoneal dialysis last night.   Changed to IV zosyn.   Objective:  Vital signs: Blood pressure (!) 140/107, pulse 90, temperature 98.1 F (36.7 C), resp. rate 18, height 5\' 6"  (1.676 m), weight 53.3 kg, SpO2 100 %.  Intake/Output Summary (Last 24 hours) at 02/07/2023 1722 Last data filed at 02/07/2023 1457 Gross per 24 hour  Intake -372.4 ml  Output --  Net -372.4 ml    Filed Weights   02/06/23 1000 02/06/23 2340 02/07/23 1124  Weight: 49.9 kg 49.6 kg 53.3 kg     Physical Exam: General:  No acute distress  Head:  Normocephalic, atraumatic. Moist oral mucosal membranes  Eyes:  Anicteric  Neck:  Supple  Lungs:   Clear to auscultation, normal effort  Heart:  regular  Abdomen:   Soft, nontender, bowel sounds present  Extremities:  peripheral edema.  Neurologic:  No tremor  Skin:  No lesions  Access: PD catheter    Basic Metabolic Panel: Recent Labs  Lab 02/04/23 0756 02/04/23 1702 02/05/23 0404  NA 136 137 137  K 2.6* 3.5 4.4  CL 99 102 100  CO2 28 27 25   GLUCOSE 94 90 88  BUN 34* 38* 45*  CREATININE 12.64* 13.00* 13.58*  CALCIUM 7.7* 7.2* 7.2*  MG 1.5* 1.9  --     GFR: Estimated Creatinine Clearance: 5.5 mL/min (A) (by C-G formula based on SCr of 13.58 mg/dL (H)).  Liver Function Tests: Recent Labs  Lab 02/04/23 0756  AST 20  ALT 14  ALKPHOS 58  BILITOT 0.6  PROT 5.1*  ALBUMIN 1.9*    Recent Labs  Lab 02/04/23 0756  LIPASE 64*    No results for input(s): "AMMONIA" in the last 168 hours.  CBC: Recent Labs  Lab 02/04/23 0756 02/05/23 0404  WBC 4.6 13.6*  NEUTROABS 3.5 12.3*  HGB 7.3* 7.6*  HCT 23.3* 24.4*  MCV 83.2 83.6  PLT 203 145*      HbA1C: No results found for: "HGBA1C"  Urinalysis: No results for input(s): "COLORURINE", "LABSPEC", "PHURINE", "GLUCOSEU", "HGBUR", "BILIRUBINUR", "KETONESUR", "PROTEINUR", "UROBILINOGEN", "NITRITE", "LEUKOCYTESUR" in the last 72  hours.  Invalid input(s): "APPERANCEUR"     Imaging: No results found.   Medications:    dialysis solution 1.5% low-MG/low-CA     piperacillin-tazobactam (ZOSYN)  IV 2.25 g (02/07/23 1413)    calcitRIOL  0.5 mcg Oral Daily   calcium acetate  667 mg Oral TID WC   Chlorhexidine Gluconate Cloth  6 each Topical Daily   feeding supplement (NEPRO CARB STEADY)  237 mL Oral TID BM   gentamicin ointment   Topical Daily   heparin  5,000 Units Subcutaneous Q8H   irbesartan  75 mg Oral Daily   lacosamide  200 mg Oral BID   levETIRAcetam  500 mg Oral Daily   metoprolol succinate  25 mg Oral Daily   multivitamin  1 tablet Oral QHS   pantoprazole  40 mg Oral Daily   predniSONE  20 mg Oral Q breakfast   senna-docusate  1 tablet Oral BID   sodium chloride flush  3 mL Intravenous Q12H    Assessment/ Plan:     22 year old female with hypertension, lupus, history of pericardial tamponade, congestive heart failure, seizures and end-stage renal disease secondary to lupus erythematosus and has been on peritoneal dialysis for the last 2 years.  She is now being admitted with history of diffuse abdominal pain.  She had  a CT scan of the abdomen and pelvis done which was negative.  Patient says she had 2 episodes of possible peritonitis and was treated with IV antibiotics previously. The peritoneal dialysis fluid was drawn this morning which was cloudy.    ESRD:Continue nightly peritoneal dialysis  Anemia with chronic kidney disease: hemoglobin 7.6. Low threshold for ESA administration.   Secondary Hyperparathyroidism: with hypocalcemia - continue calcium acetate and calcitriol.  Hypertension with chronic kidney disease - irbesartan and metoprolol  Peritonitis: reaction to cefepime. Will transition to IV pip/tazo and discontinue vancomycin. Cultures with gram negative rods in peritoneal fluid on 02/04/23.    LOS: 2 Lamont Dowdy, MD [facility] 5/14/20245:22 PM

## 2023-02-07 NOTE — Progress Notes (Signed)
     Peritoneal Dialysis Treatment Initiation Note     Pre Treatment Weight:53.2 KG   Consent signed and in chart.  PD treatment initiated via aseptic technique.    Patient is awake and alert. No complaints of pain.     Dressing changed PD exit site clean, dry and intact, Gentamicin applied      Hand-off given to the patient's nurse.  Education provided to dept staff  regarding PD machine and how  to contact tech support if machine  alarms.      Adah Salvage RN [facility] Unit

## 2023-02-07 NOTE — Progress Notes (Signed)
       CROSS COVER NOTE  NAME: Melinda Steele MRN: [id number] DOB : 01-29-2001    HPI/Events of Note   Report:This pt is on peritoneal dialysis and she is c/o that she hasn't had BM in 2 days and she is filling discomfort and tightness in the abdomen. She asked me to let you know. I believe they added Miralax and Sennokot prn. She is requesting an enema.   On review of chart: Last progress note reviewed.  Admitted with peritoneal dialysis associated gram-negative rod peritonitis    Assessment and  Interventions   Assessment:moderate constipation, little relief with Senokot and MiraLAX Plan: Dulcolax 10 mg suppository x 1 and continue daily laxatives If no improvement will order tapwater enema X

## 2023-02-07 NOTE — Plan of Care (Signed)
Patient AOX3, disoriented to time, forgetful and irritable at baseline.  VSS throughout shift.  All meds given on time as ordered.  Pt c.o pain relieved by PRN pain med,  PD cath intact and dialysis started via dialysis nurse.  POC maintained, will continue to monitor.  Problem: Education: Goal: Knowledge of General Education information will improve Description: Including pain rating scale, medication(s)/side effects and non-pharmacologic comfort measures Outcome: Progressing   Problem: Health Behavior/Discharge Planning: Goal: Ability to manage health-related needs will improve Outcome: Progressing   Problem: Clinical Measurements: Goal: Ability to maintain clinical measurements within normal limits will improve Outcome: Progressing Goal: Will remain free from infection Outcome: Progressing Goal: Diagnostic test results will improve Outcome: Progressing Goal: Respiratory complications will improve Outcome: Progressing Goal: Cardiovascular complication will be avoided Outcome: Progressing   Problem: Activity: Goal: Risk for activity intolerance will decrease Outcome: Progressing   Problem: Nutrition: Goal: Adequate nutrition will be maintained Outcome: Progressing   Problem: Coping: Goal: Level of anxiety will decrease Outcome: Progressing   Problem: Elimination: Goal: Will not experience complications related to bowel motility Outcome: Progressing Goal: Will not experience complications related to urinary retention Outcome: Progressing   Problem: Pain Managment: Goal: General experience of comfort will improve Outcome: Progressing   Problem: Safety: Goal: Ability to remain free from injury will improve Outcome: Progressing   Problem: Skin Integrity: Goal: Risk for impaired skin integrity will decrease Outcome: Progressing

## 2023-02-08 DIAGNOSIS — T8571XD Infection and inflammatory reaction due to peritoneal dialysis catheter, subsequent encounter: Secondary | ICD-10-CM | POA: Diagnosis not present

## 2023-02-08 LAB — TYPE AND SCREEN
ABO/RH(D): B POS
Antibody Screen: NEGATIVE

## 2023-02-08 LAB — BASIC METABOLIC PANEL
Anion gap: 11 (ref 5–15)
BUN: 50 mg/dL — ABNORMAL HIGH (ref 6–20)
CO2: 27 mmol/L (ref 22–32)
Calcium: 7.8 mg/dL — ABNORMAL LOW (ref 8.9–10.3)
Chloride: 93 mmol/L — ABNORMAL LOW (ref 98–111)
Creatinine, Ser: 10.91 mg/dL — ABNORMAL HIGH (ref 0.44–1.00)
GFR, Estimated: 5 mL/min — ABNORMAL LOW (ref >60)
Glucose, Bld: 94 mg/dL (ref 70–99)
Potassium: 3.4 mmol/L — ABNORMAL LOW (ref 3.5–5.1)
Sodium: 131 mmol/L — ABNORMAL LOW (ref 135–145)

## 2023-02-08 LAB — BPAM RBC: Unit Type and Rh: 7300

## 2023-02-08 LAB — CBC
HCT: 17.9 % — ABNORMAL LOW (ref 36.0–46.0)
Hemoglobin: 5.8 g/dL — ABNORMAL LOW (ref 12.0–15.0)
MCH: 26 pg (ref 26.0–34.0)
MCHC: 32.4 g/dL (ref 30.0–36.0)
MCV: 80.3 fL (ref 80.0–100.0)
Platelets: 107 10*3/uL — ABNORMAL LOW (ref 150–400)
RBC: 2.23 MIL/uL — ABNORMAL LOW (ref 3.87–5.11)
RDW: 15.7 % — ABNORMAL HIGH (ref 11.5–15.5)
WBC: 6.5 10*3/uL (ref 4.0–10.5)
nRBC: 0 % (ref 0.0–0.2)

## 2023-02-08 LAB — PREPARE RBC (CROSSMATCH)

## 2023-02-08 LAB — HEMOGLOBIN AND HEMATOCRIT, BLOOD
HCT: 18.8 % — ABNORMAL LOW (ref 36.0–46.0)
Hemoglobin: 6.2 g/dL — ABNORMAL LOW (ref 12.0–15.0)

## 2023-02-08 LAB — HEPATITIS B SURFACE ANTIBODY, QUANTITATIVE: Hep B S AB Quant (Post): 9.5 m[IU]/mL — ABNORMAL LOW (ref >9.9)

## 2023-02-08 MED ORDER — OXYCODONE HCL 5 MG PO TABS
5.0000 mg | ORAL_TABLET | ORAL | Status: DC | PRN
  Administered 2023-02-08: 10 mg via ORAL
  Filled 2023-02-08: qty 2

## 2023-02-08 MED ORDER — SODIUM CHLORIDE 0.9% IV SOLUTION: Freq: Once | INTRAVENOUS | Status: DC

## 2023-02-08 MED ORDER — PROMETHAZINE HCL 25 MG PO TABS
12.5000 mg | ORAL_TABLET | Freq: Four times a day (QID) | ORAL | Status: DC | PRN
  Administered 2023-02-08: 12.5 mg via ORAL
  Filled 2023-02-08 (×2): qty 1

## 2023-02-08 MED ORDER — DELFLEX-LC/1.5% DEXTROSE 344 MOSM/L IP SOLN
Freq: Every day | INTRAPERITONEAL | Status: DC
  Filled 2023-02-08: qty 6000

## 2023-02-08 MED ORDER — METHOCARBAMOL 500 MG PO TABS
500.0000 mg | ORAL_TABLET | Freq: Three times a day (TID) | ORAL | Status: DC
  Administered 2023-02-08 (×2): 500 mg via ORAL
  Filled 2023-02-08 (×2): qty 1

## 2023-02-08 MED ORDER — DELFLEX-LC/1.5% DEXTROSE 344 MOSM/L IP SOLN
Freq: Once | INTRAPERITONEAL | Status: AC
  Filled 2023-02-08: qty 6000

## 2023-02-08 MED ORDER — CEFTRIAXONE SODIUM 1 G IJ SOLR
1.0000 g | INTRAMUSCULAR | Status: DC
Start: 2023-02-08 — End: 2023-02-08

## 2023-02-08 NOTE — Progress Notes (Signed)
Peritoneal Dialysis  Post Treatment Note:  PD treatment completed. Patient tolerated treatment well.   PD effluent is clear ywllow. PD exit site clean, dry and intact.   No specimen collected.  Patient is awake and alert and no acute distress.  Hand-off given to patient's nurse.   Total UF removed: -199 ml Post treatment weight: 50.7 kg   Ralene Muskrat RN Kidney Dialysis Unit

## 2023-02-08 NOTE — Progress Notes (Signed)
Peritoneal Dialysis Treatment Initiation Note  Pre Treatment Weight: 49.5 kg  Consent signed and in chart.  PD treatment initiated via aseptic technique.   Patient is awake and alert. No complaints of pain.   PD exit site clean, dry and intact.  Gentamicin and new dressing applied.   Hand-off given to the patient's nurse.  Education provided to dept staff  regarding PD machine and how  to contact tech support if machine  alarms.    Ralene Muskrat RN [facility] Unit

## 2023-02-08 NOTE — Progress Notes (Addendum)
2025: Floor RN called Dialysis unit due to patient's complain of severe abdominal pain during fill. Mother requested to revise PD to 2.5L fill and 5 cycles (Home set up). Notified Dr. Wynelle Link and agreed. When I came to her room, her Tx was paused by mother. Attempted to drain prior to unhooking patient to the PD Machine.

## 2023-02-08 NOTE — Progress Notes (Signed)
[facility]  PROGRESS NOTE   Subjective:   Peritoneal dialysis last night.   Patient with no complaints.   Hemoglobin 6.2  Objective:  Vital signs: Blood pressure (!) 158/111, pulse 99, temperature 98.4 F (36.9 C), temperature source Oral, resp. rate 18, height 5\' 6"  (1.676 m), weight 53.9 kg, SpO2 100 %.  Intake/Output Summary (Last 24 hours) at 02/08/2023 1347 Last data filed at 02/07/2023 1457 Gross per 24 hour  Intake 0 ml  Output --  Net 0 ml    Filed Weights   02/07/23 1124 02/07/23 1909 02/08/23 0500  Weight: 53.3 kg 53.2 kg 53.9 kg     Physical Exam: General:  No acute distress  Head:  Normocephalic, atraumatic. Moist oral mucosal membranes  Eyes:  Anicteric  Neck:  Supple  Lungs:   Clear to auscultation, normal effort  Heart:  regular  Abdomen:   Soft, nontender, bowel sounds present  Extremities:  peripheral edema.  Neurologic:  No tremor  Skin:  No lesions  Access: PD catheter    Basic Metabolic Panel: Recent Labs  Lab 02/04/23 0756 02/04/23 1702 02/05/23 0404 02/08/23 0650  NA 136 137 137 131*  K 2.6* 3.5 4.4 3.4*  CL 99 102 100 93*  CO2 28 27 25 27   GLUCOSE 94 90 88 94  BUN 34* 38* 45* 50*  CREATININE 12.64* 13.00* 13.58* 10.91*  CALCIUM 7.7* 7.2* 7.2* 7.8*  MG 1.5* 1.9  --   --     GFR: Estimated Creatinine Clearance: 6.9 mL/min (A) (by C-G formula based on SCr of 10.91 mg/dL (H)).  Liver Function Tests: Recent Labs  Lab 02/04/23 0756  AST 20  ALT 14  ALKPHOS 58  BILITOT 0.6  PROT 5.1*  ALBUMIN 1.9*    Recent Labs  Lab 02/04/23 0756  LIPASE 64*    No results for input(s): "AMMONIA" in the last 168 hours.  CBC: Recent Labs  Lab 02/04/23 0756 02/05/23 0404 02/08/23 0650 02/08/23 1111  WBC 4.6 13.6* 6.5  --   NEUTROABS 3.5 12.3*  --   --   HGB 7.3* 7.6* 5.8* 6.2*  HCT 23.3* 24.4* 17.9* 18.8*  MCV 83.2 83.6 80.3  --   PLT 203 145* 107*  --       HbA1C: No results found for:  "HGBA1C"  Urinalysis: No results for input(s): "COLORURINE", "LABSPEC", "PHURINE", "GLUCOSEU", "HGBUR", "BILIRUBINUR", "KETONESUR", "PROTEINUR", "UROBILINOGEN", "NITRITE", "LEUKOCYTESUR" in the last 72 hours.  Invalid input(s): "APPERANCEUR"     Imaging: No results found.   Medications:    dialysis solution 1.5% low-MG/low-CA     piperacillin-tazobactam (ZOSYN)  IV 2.25 g (02/08/23 0534)    sodium chloride   Intravenous Once   calcitRIOL  0.5 mcg Oral Daily   calcium acetate  667 mg Oral TID WC   Chlorhexidine Gluconate Cloth  6 each Topical Daily   feeding supplement (NEPRO CARB STEADY)  237 mL Oral TID BM   gentamicin ointment   Topical Daily   heparin  5,000 Units Subcutaneous Q8H   irbesartan  75 mg Oral Daily   lacosamide  200 mg Oral BID   levETIRAcetam  500 mg Oral Daily   metoprolol succinate  25 mg Oral Daily   multivitamin  1 tablet Oral QHS   pantoprazole  40 mg Oral Daily   predniSONE  20 mg Oral Q breakfast   senna-docusate  1 tablet Oral BID   sodium chloride flush  3 mL Intravenous Q12H  Assessment/ Plan:     22 year old female with hypertension, lupus, history of pericardial tamponade, congestive heart failure, seizures and end-stage renal disease secondary to lupus erythematosus and has been on peritoneal dialysis for the last 2 years.  She is now being admitted with history of diffuse abdominal pain.  She had a CT scan of the abdomen and pelvis done which was negative.  Patient says she had 2 episodes of possible peritonitis and was treated with IV antibiotics previously. The peritoneal dialysis fluid was drawn this morning which was cloudy.    ESRD: Continue nightly peritoneal dialysis  Anemia with chronic kidney disease:  Transfusion for today.  ESA as outpatient.   Secondary Hyperparathyroidism: with hypocalcemia - continue calcium acetate and calcitriol.  Hypertension with chronic kidney disease - irbesartan and metoprolol  Peritonitis:  reaction to cefepime. Citrobacter braakii.  Start IP ceftriaxone 1g daily for a total of 3 weeks.    LOS: 3 Lamont Dowdy, MD [facility] 5/15/20241:47 PM

## 2023-02-08 NOTE — Progress Notes (Signed)
PROGRESS NOTE    Melinda Steele  UEA:[id number] DOB: 04/28/2001 DOA: 02/04/2023 PCP: [facility]    Brief Narrative:  Melinda Steele is a 22 y.o. 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 History of suspected PD associated peritonitis in November 2023 and then March 2024, with both cultures demonstrating no growth. Peritoneal fluid was sent to lab and she was started on empiric antibiotics with cefepime and vancomycin. Hospitalist called for admission.    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  Continue PD.  Will reach out to nephrology regarding administration of intraperitoneal Rocephin with peritoneal dialysis in-house.  Possible discharge in 24 hours.  Patient endorsing abdominal pain.  Will treat with nonnarcotics as per patient request.   Hypokalemia, hypomagnesemia Questionably dietary in etiology, continue supplement as appropriate   ESRD (end-stage renal disease) due to  SLE [facility]) Patient is currently on peritoneal dialysis -defer to nephrology, PD ongoing.  Follow-up with [facility] nephrology postdischarge   Chronic HFrEF (heart failure with reduced ejection fraction) (HCC) EF, last 50-55%.  Currently euvolemic on exam.   Systemic lupus erythematosus (HCC) History of SLE diagnosed in 2017 with multiple complications including lupus nephritis, pericarditis with pleural effusion causing tamponade, vasculitis.  Follows with [facility] rheumatology. - Continue home immunosuppressive therapy   Seizures (HCC) - Continue Vimpat   Anemia of chronic disease/ESRD Acute on chronic anemia Patient's baseline hemoglobin is around 8.  On 5/15 hemoglobin 5.8.  Repeat at 6.2.  Hemodynamically stable with no obvious blood loss.  Discussed case with nephrology who spoke to patient's [facility] nephrologist.  [facility] nephrology did recommend transfusion however patient and mother currently declining transfusion at this time.  At this moment I feel that is reasonable.  There is no signs of acute blood loss.  Will recheck hemoglobin in a.m.  Defer to nephrology regarding EPO with dialysis  DVT prophylaxis: SQ heparin Code Status: Full Family Communication: Mother at bedside 5/15 Disposition Plan: Status is: Inpatient Remains inpatient appropriate because: Infectious peritonitis on IV antibiotics   Level of care: Telemetry Medical  Consultants:  Nephrology  Procedures:  None  Antimicrobials: Intraperitoneal Rocephin   Subjective: Seen and examined.  Continues to report crampy intermittent abdominal pain.  Endorses right arm swelling and bilateral lower extremity edema.  Objective: Vitals:   02/08/23 0500 02/08/23 0538 02/08/23 0910 02/08/23 1538  BP:  (!) 140/102 (!) 158/111 (!) 162/116  Pulse:  97 99 90  Resp:  18 18 18   Temp:  98 F (36.7 C) 98.4 F (36.9 C) 98.4 F (36.9 C)  TempSrc:  Oral Oral Oral  SpO2:  100% 100% 99%  Weight: 53.9 kg     Height:        Intake/Output  Summary (Last 24 hours) at 02/08/2023 1549 Last data filed at 02/08/2023 0715 Gross per 24 hour  Intake -199 ml  Output --  Net -199 ml   Filed Weights   02/07/23 1124 02/07/23 1909 02/08/23 0500  Weight: 53.3 kg 53.2 kg 53.9 kg    Examination:  General exam: No acute distress Respiratory system: Clear to auscultation. Respiratory effort normal. Cardiovascular system: S1-S2, RRR, no murmurs, trace pedal edema BLE Gastrointestinal system: Thin, soft, NT/ND, normal bowel sounds, PD catheter in place Central nervous system: Alert and oriented. No focal neurological deficits. Extremities: Symmetric 5 x 5 power. Skin: No rashes, lesions or ulcers Psychiatry: Judgement and insight appear normal. Mood & affect flattened.     Data Reviewed: I have personally reviewed following labs and imaging studies  CBC: Recent Labs  Lab 02/04/23 0756 02/05/23 0404 02/08/23 0650 02/08/23 1111  WBC 4.6 13.6* 6.5  --   NEUTROABS 3.5 12.3*  --   --   HGB 7.3* 7.6* 5.8* 6.2*  HCT 23.3* 24.4* 17.9* 18.8*  MCV 83.2 83.6 80.3  --   PLT 203 145* 107*  --    Basic Metabolic Panel: Recent Labs  Lab 02/04/23 0756 02/04/23 1702 02/05/23 0404 02/08/23 0650  NA 136 137 137 131*  K 2.6* 3.5 4.4 3.4*  CL 99 102 100 93*  CO2 28 27 25 27   GLUCOSE 94 90 88 94  BUN 34* 38* 45* 50*  CREATININE 12.64* 13.00* 13.58* 10.91*  CALCIUM 7.7* 7.2* 7.2* 7.8*  MG 1.5* 1.9  --   --    GFR: Estimated Creatinine Clearance: 6.9 mL/min (A) (by C-G formula based on SCr of 10.91 mg/dL (H)). Liver Function Tests: Recent Labs  Lab 02/04/23 0756  AST 20  ALT 14  ALKPHOS 58  BILITOT 0.6  PROT 5.1*  ALBUMIN 1.9*   Recent Labs  Lab 02/04/23 0756  LIPASE 64*   No results for input(s): "AMMONIA" in the last 168 hours. Coagulation Profile: No results for input(s): "INR", "PROTIME" in the last 168 hours. Cardiac Enzymes: No results for input(s): "CKTOTAL", "CKMB", "CKMBINDEX", "TROPONINI" in the last 168  hours. BNP (last 3 results) No results for input(s): "PROBNP" in the last 8760 hours. HbA1C: No results for input(s): "HGBA1C" in the last 72 hours. CBG: No results for input(s): "GLUCAP" in the last 168 hours. Lipid Profile: No results for input(s): "CHOL", "HDL", "LDLCALC", "TRIG", "CHOLHDL", "LDLDIRECT" in the last 72 hours. Thyroid Function Tests: No results for input(s): "TSH", "T4TOTAL", "FREET4", "T3FREE", "THYROIDAB" in the last 72 hours. Anemia Panel: No results for input(s): "VITAMINB12", "FOLATE", "FERRITIN", "TIBC", "IRON", "RETICCTPCT" in the last 72 hours. Sepsis Labs: No results for input(s): "PROCALCITON", "LATICACIDVEN" in the last 168 hours.  Recent Results (from the past 240 hour(s))  Peritoneal fluid culture w Gram Stain     Status: None   Collection Time: 02/04/23 10:19 AM   Specimen: Peritoneal Washings; Peritoneal Fluid  Result Value Ref Range Status   Specimen Description   Final    PERITONEAL Performed at [facility], [address] [place], [place] [address]    Special Requests   Final    NONE Performed at [facility], [address] [place], [place] [address]    Gram Stain   Final    RARE WBC PRESENT, PREDOMINANTLY MONONUCLEAR RARE GRAM NEGATIVE RODS Performed at [facility], [address], [place], [place] [address]    Culture MODERATE CITROBACTER BRAAKII  Final   Report Status 02/07/2023 FINAL  Final   Organism ID, Bacteria CITROBACTER BRAAKII  Final      Susceptibility   Citrobacter braakii - MIC*    CEFEPIME <=0.12 SENSITIVE Sensitive     CEFTAZIDIME <=1 SENSITIVE Sensitive     CEFTRIAXONE <=0.25 SENSITIVE Sensitive     CIPROFLOXACIN <=0.25 SENSITIVE Sensitive     GENTAMICIN <=1 SENSITIVE Sensitive     IMIPENEM 2 SENSITIVE Sensitive     TRIMETH/SULFA <=20 SENSITIVE Sensitive     PIP/TAZO <=4 SENSITIVE Sensitive     * MODERATE CITROBACTER BRAAKII  Culture, blood (Routine X 2) w Reflex to ID Panel     Status:  None (Preliminary result)   Collection Time: 02/04/23  9:13 PM   Specimen: BLOOD  Result Value Ref Range Status   Specimen Description BLOOD BLOOD RIGHT ARM  Final   Special Requests   Final    BOTTLES DRAWN AEROBIC ONLY Blood Culture adequate volume   Culture   Final    NO GROWTH 4 DAYS Performed at [facility], [address] [place], [place] [address]    Report Status PENDING  Incomplete  Culture, blood (Routine X 2) w Reflex to ID Panel     Status: None (Preliminary result)   Collection Time: 02/04/23  9:23 PM   Specimen: BLOOD  Result Value Ref Range Status   Specimen Description BLOOD BLOOD LEFT ARM  Final   Special Requests   Final    BOTTLES DRAWN AEROBIC AND ANAEROBIC Blood Culture adequate volume   Culture   Final    NO GROWTH 4 DAYS Performed at [facility], [address], [place], [place] [address]    Report Status PENDING  Incomplete         Radiology Studies: No results found.      Scheduled Meds:  calcitRIOL  0.5 mcg Oral Daily   calcium acetate  667 mg Oral TID WC   Chlorhexidine Gluconate Cloth  6 each Topical Daily   feeding supplement (NEPRO CARB STEADY)  237 mL Oral TID BM   gentamicin ointment   Topical Daily   heparin  5,000 Units Subcutaneous Q8H   irbesartan  75 mg Oral Daily   lacosamide  200 mg Oral BID   levETIRAcetam  500 mg Oral Daily   methocarbamol  500 mg Oral TID   metoprolol succinate  25 mg Oral Daily   multivitamin  1 tablet Oral QHS   pantoprazole  40 mg Oral Daily   predniSONE  20 mg Oral Q breakfast   senna-docusate  1 tablet Oral BID   sodium chloride flush  3 mL Intravenous Q12H   Continuous Infusions:  dialysis solution 1.5% low-MG/low-CA     piperacillin-tazobactam (ZOSYN)  IV 2.25 g (02/08/23 0534)     LOS: 3 days     Tresa Moore, MD [facility]   If 7PM-7AM, please contact night-coverage  02/08/2023, 3:49 PM

## 2023-02-08 NOTE — Plan of Care (Signed)

## 2023-02-09 LAB — CULTURE, BLOOD (ROUTINE X 2)
Culture: NO GROWTH
Special Requests: ADEQUATE

## 2023-02-09 NOTE — Progress Notes (Signed)
HD nurse to Rm 224 to start PD on Pt. Pt and mother questioned the amount of cycles and fluid to be used which was 4 cycles and 3000 mls. Pt mother noted that Pt does 5 cycles and uses 2500 ml at home. CN asked if they would like Tx to be changed to home Tx which would mean calling the Nephrologist  and having Pharmacy redo the prescription as stated by the HD RN but the Pt mother noted that she did not want to delay treatment so the HD nurse can start the treatment. Approx 45 mins into cycle Pt asked for the Tx to be stopped. The HD nurse came to Rm and stopped the Tx. After discussion with the HD nurse and the MD and the mother the prescription was redone to suit the Pt's home Tx. During the Tx the machine kept alarming noting slow flow. Per Policy and the information attached to the machine the manufacturer was called for assistance. The phone was put on speaker so that the Pt and mother could hear the request and commands made by the Rep. to help get the flow rate up.On several occasions the Pt was asked to move in the bed from side to side, sit up in the bed, sit at the side of the bed or stand at the side of the bed to which the Pt noted that she could not do. At one time the mother assisted her to turn slightly to the left but this did not help. After several attempts to get the Pt to move in the bed she finally sat at the side of the bed. The CN asked the Pt if she could assist her to stand for a couple mins. and she said no. The Rep. continued to have staff trouble shoot the lines but with no change in the flow rate. The Rep. then suggested that the TX be changed to manual. At this time the Pt got up off the bed and went into the bathroom. When the Pt returned to the bed the PD started Cycle 2 of 5 and this was relayed to the Rep who then noted that this was good and informed to call if any other issues. The Pt then stated that she did not drain completely and that she was going home to do her PD herself.  Pt mother was in agreement. CN called the manufacturer number back so that Pt could stay and complete the TX and was on hold but Pt mother noted that they were not staying. CN tried to encourage them to stay but Pt mother noted that they would do her Tx at home. CN asked if they needed assistance to disconnect the lined but the mother noted that she knew how to do it. CN informed that it should be in a  sterile setting and the mother noted that she washed her hands and had her mask on.The CN stepped out so that the mother could proceed. The [facility] and the NP were called and informed of the situation. The RN had the Pt signed the AMA papers and Pt dressed herself and got into the wheelchair and mother pushed her down.

## 2023-02-09 NOTE — Progress Notes (Signed)
         CROSS COVER NOTE  NAME: Melinda Steele MRN: [id number] DOB : 26-Nov-2000 ATTENDING PHYSICIAN: Tresa Moore, MD                                                                    Against Medical Advice Patient at this time expresses desire to leave the Hospital immediately, patient has been warned that this is not Medically advisable at this time, and can result in Medical complications like Death and Disability. M(r)s Chad was not happy with the change in Peritoneal Dialysis settings and issues with the dialysis machine overnight. Patient has full decision making capacity and understands and accepts the risks involved and assumes full responsibilty of this decision.  This patient has also been advised that if they feel the need for further medical assistance to return to the closest ER or dial [phone].    This document was prepared using Dragon voice recognition software and may include unintentional dictation errors.  Bishop Limbo DNP, MBA, FNP-BC, PMHNP-BC Nurse Practitioner [facility] Pager [phone]

## 2023-02-09 NOTE — Discharge Summary (Signed)
AMA Discharge Summary  Brief Narrative:  Melinda Steele is a 22 y.o. 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 History of suspected PD associated peritonitis in November 2023 and then March 2024, with both cultures demonstrating no growth. Peritoneal fluid was sent to lab and she was started on empiric antibiotics with cefepime and vancomycin. Hospitalist called for admission.   Patient was admitted with a working diagnosis of gram-negative rod peritonitis in the setting of peritoneal dialysis.  Appears to be recurrent episode.  Nephrology involved and antibiotics initiated.  Initially on IV Zosyn and after culture data plan to proceed with intraperitoneal Rocephin.  Early a.m. on 5/16 per documentation in medical record the peritoneal dialysis machine was alarming.  There was no dialysis RN available.  Charge RN responded to bedside to troubleshoot.  Manufacture contacted.  Again per documentation patient was advised to stand to facilitate flow.  Patient states she is "unable to move" at this time patient advises she was to leave AGAINST MEDICAL ADVICE as PD machine at home works better.  Mother present at bedside and agreed.  Patient was advised to stay by charge RN and complete course of treatment and see MD in AM.  Patient and mother disagreed.  Alert and oriented x 4.  Patient signed AMA forms.  NP overnight contacted.  Patient was advised to seek urgent medical attention or return to the closest ER or dial 911 for any issues.  Patient remains high risk for hospital readmission.  Lolita Patella MD  No charge

## 2023-02-09 NOTE — Progress Notes (Addendum)
Pt PD alarming, no RD RN available. Charge RN (Dawn, RN) called to bedside to trouble shoot. Manufacturer contacted per policy, advises Pt to stand at bedside. Pt states she is unable to stand. PD continues to alarm. Pt again advised to stand to facilitate flow and states she is "unable to move." This RN leaves to attend another Pt with Charge RN in attendance. Returns to find Pt walking around room without complaint of pain or discomfort. Charge RN at bedside, continuing to trouble shoot PD. Pt advises she wishes to leave AMA, as her PD machine at home works better and she is familiar with it. Mother at bedside agrees. Both are advised by Charge RN to stay and complete the course of treatment/see MD in AM. Pt and Mother vehemently disagree and wish to leave immediately. Pt is A&O4, calm and cooperative. Same signs AMA form, gathers belongings, dresses and ambulates to wheelchair without incident. Pt denies presence of pain or discomfort and departs unit without incident with mother in attendance.

## 2023-02-10 LAB — TYPE AND SCREEN: Unit division: 0

## 2023-02-10 LAB — BPAM RBC: Blood Product Expiration Date: 202406092359

## 2023-05-25 ENCOUNTER — Other Ambulatory Visit: Payer: Self-pay

## 2023-05-25 ENCOUNTER — Emergency Department
Admission: EM | Admit: 2023-05-25 | Discharge: 2023-05-25 | Disposition: A | Discharge status: Home / Self Care | Payer: Medicaid Other | Attending: Emergency Medicine | Admitting: Emergency Medicine

## 2023-05-25 ENCOUNTER — Other Ambulatory Visit: Payer: Self-pay | Admitting: Internal Medicine

## 2023-05-25 DIAGNOSIS — R569 Unspecified convulsions: Secondary | ICD-10-CM | POA: Diagnosis not present

## 2023-05-25 DIAGNOSIS — I5030 Unspecified diastolic (congestive) heart failure: Secondary | ICD-10-CM | POA: Diagnosis not present

## 2023-05-25 DIAGNOSIS — D696 Thrombocytopenia, unspecified: Secondary | ICD-10-CM | POA: Diagnosis not present

## 2023-05-25 DIAGNOSIS — N186 End stage renal disease: Secondary | ICD-10-CM | POA: Insufficient documentation

## 2023-05-25 DIAGNOSIS — I132 Hypertensive heart and chronic kidney disease with heart failure and with stage 5 chronic kidney disease, or end stage renal disease: Secondary | ICD-10-CM | POA: Insufficient documentation

## 2023-05-25 DIAGNOSIS — Z992 Dependence on renal dialysis: Secondary | ICD-10-CM | POA: Diagnosis not present

## 2023-05-25 LAB — BASIC METABOLIC PANEL
Anion gap: 14 (ref 5–15)
BUN: 12 mg/dL (ref 6–20)
CO2: 29 mmol/L (ref 22–32)
Calcium: 8.8 mg/dL — ABNORMAL LOW (ref 8.9–10.3)
Chloride: 93 mmol/L — ABNORMAL LOW (ref 98–111)
Creatinine, Ser: 3.96 mg/dL — ABNORMAL HIGH (ref 0.44–1.00)
GFR, Estimated: 16 mL/min — ABNORMAL LOW (ref >60)
Glucose, Bld: 92 mg/dL (ref 70–99)
Potassium: 3.5 mmol/L (ref 3.5–5.1)
Sodium: 136 mmol/L (ref 135–145)

## 2023-05-25 LAB — CBC WITH DIFFERENTIAL/PLATELET
Abs Immature Granulocytes: 0.05 10*3/uL (ref 0.00–0.07)
Basophils Absolute: 0 10*3/uL (ref 0.0–0.1)
Basophils Relative: 0 %
Eosinophils Absolute: 0.1 10*3/uL (ref 0.0–0.5)
Eosinophils Relative: 4 %
HCT: 37.4 % (ref 36.0–46.0)
Hemoglobin: 11 g/dL — ABNORMAL LOW (ref 12.0–15.0)
Immature Granulocytes: 2 %
Lymphocytes Relative: 16 %
Lymphs Abs: 0.5 10*3/uL — ABNORMAL LOW (ref 0.7–4.0)
MCH: 25.6 pg — ABNORMAL LOW (ref 26.0–34.0)
MCHC: 29.4 g/dL — ABNORMAL LOW (ref 30.0–36.0)
MCV: 87 fL (ref 80.0–100.0)
Monocytes Absolute: 0.3 10*3/uL (ref 0.1–1.0)
Monocytes Relative: 8 %
Neutro Abs: 2.4 10*3/uL (ref 1.7–7.7)
Neutrophils Relative %: 70 %
Platelets: 29 10*3/uL — CL (ref 150–400)
RBC: 4.3 MIL/uL (ref 3.87–5.11)
RDW: 17.2 % — ABNORMAL HIGH (ref 11.5–15.5)
WBC: 3.4 10*3/uL — ABNORMAL LOW (ref 4.0–10.5)
nRBC: 0 % (ref 0.0–0.2)

## 2023-05-25 LAB — VITAMIN B12: Vitamin B-12: 316 pg/mL (ref 180–914)

## 2023-05-25 LAB — FOLATE: Folate: >40 ng/mL (ref >5.9)

## 2023-05-25 LAB — HEPATIC FUNCTION PANEL
ALT: 17 U/L (ref 0–44)
AST: 26 U/L (ref 15–41)
Albumin: 3.3 g/dL — ABNORMAL LOW (ref 3.5–5.0)
Alkaline Phosphatase: 87 U/L (ref 38–126)
Bilirubin, Direct: <0.1 mg/dL (ref 0.0–0.2)
Total Bilirubin: 0.5 mg/dL (ref 0.3–1.2)
Total Protein: 7.4 g/dL (ref 6.5–8.1)

## 2023-05-25 LAB — PROTIME-INR
INR: 1 (ref 0.8–1.2)
Prothrombin Time: 13.6 seconds (ref 11.4–15.2)

## 2023-05-25 LAB — LACTATE DEHYDROGENASE: LDH: 171 U/L (ref 98–192)

## 2023-05-25 LAB — FIBRINOGEN: Fibrinogen: 324 mg/dL (ref 210–475)

## 2023-05-25 LAB — APTT: aPTT: 32 seconds (ref 24–36)

## 2023-05-25 MED ORDER — DEXAMETHASONE 20 MG PO TABS: 40.0000 mg | ORAL_TABLET | Freq: Every day | ORAL | 0 refills | Status: AC

## 2023-05-25 NOTE — Discharge Instructions (Addendum)
Continue taking your Vimpat as prescribed.  Follow-up with your neurologist.  Labs today showed that your platelet count is low.  This can be due to your autoimmune conditions.  We have consulted hematology.  The hematology office will contact you to arrange for follow-up within the next few days for repeat labs.  In the meantime, return to the ER immediately for new, worsening, recurrent symptoms including any recurrent seizures, any abnormal bleeding, bruising, rash that does not blanch when you apply pressure, severe headache, blood in the stool, or any other new or worsening symptoms that concern you.

## 2023-05-25 NOTE — ED Notes (Signed)
Pt refusing to answer any questions, on her phone and not responding to this nurse.

## 2023-05-25 NOTE — ED Triage Notes (Signed)
[facility] reports pt coming from dialysis where she had a full body seizure lasting about per staff. History of seizures. Pt received all but 13 min of dialysis. Pt postictal with EMS.

## 2023-05-25 NOTE — ED Provider Notes (Signed)
[facility] Provider Note    Event Date/Time   First MD Initiated Contact with Patient 05/25/23 [phone]     (approximate)   History   Seizures   HPI  Melinda Steele is a 22 y.o. female with a history of ESRD on dialysis, SLE, lupus nephritis, pericardial effusion, AIHA, vasculitis, HFpEF, hypertension, press syndrome, and seizure disorder who presents after apparent seizure.  The patient denies having any prodromal symptoms.  She states that she is feeling fine now and denies any headache, other pain, or other acute symptoms.  She states that she is on Vimpat for seizures and reports that she has been compliant and has not missed any doses that she knows of.  She states that it has been "a minute" since she had her last seizure, but declines to clarify whether she means weeks, months, or longer.  I reviewed the past medical records.  The patient was most recently admitted to the hospitalist service in May with abdominal pain and was worked up and treated for peritonitis with IV Zosyn and intraperitoneal Rocephin.   Physical Exam   Triage Vital Signs: ED Triage Vitals  Encounter Vitals Group     BP 05/25/23 0928 (!) 137/105     Systolic BP Percentile --      Diastolic BP Percentile --      Pulse Rate 05/25/23 0928 (!) 53     Resp 05/25/23 0928 18     Temp 05/25/23 0928 98.2 F (36.8 C)     Temp Source 05/25/23 0928 Oral     SpO2 05/25/23 0928 98 %     Weight --      Height --      Head Circumference --      Peak Flow --      Pain Score 05/25/23 0929 0     Pain Loc --      Pain Education --      Exclude from Growth Chart --     Most recent vital signs: Vitals:   05/25/23 1215 05/25/23 1230  BP:  (!) 124/97  Pulse: 87 94  Resp: 18 (!) 24  Temp:    SpO2: 100% 100%     General: Alert and oriented, flat affect, no distress.  CV:  Good peripheral perfusion.  Resp:  Normal effort.  Abd:  No distention.  Other:  EOMI.  PERRLA.  No photophobia.   Motor intact in all extremities.  Normal speech and coordination.   ED Results / Procedures / Treatments   Labs (all labs ordered are listed, but only abnormal results are displayed) Labs Reviewed  BASIC METABOLIC PANEL - Abnormal; Notable for the following components:      Result Value   Chloride 93 (*)    Creatinine, Ser 3.96 (*)    Calcium 8.8 (*)    GFR, Estimated 16 (*)    All other components within normal limits  CBC WITH DIFFERENTIAL/PLATELET - Abnormal; Notable for the following components:   WBC 3.4 (*)    Hemoglobin 11.0 (*)    MCH 25.6 (*)    MCHC 29.4 (*)    RDW 17.2 (*)    Platelets 29 (*)    Lymphs Abs 0.5 (*)    All other components within normal limits  HEPATIC FUNCTION PANEL - Abnormal; Notable for the following components:   Albumin 3.3 (*)    All other components within normal limits  PROTIME-INR  APTT  FIBRINOGEN  LACTATE DEHYDROGENASE  VITAMIN B12  FOLATE  CBC WITH DIFFERENTIAL/PLATELET  HAPTOGLOBIN     EKG  ED ECG REPORT I, Dionne Bucy, the attending physician, personally viewed and interpreted this ECG.  Date: 05/25/2023 EKG Time: 0931 Rate: 106 Rhythm: normal sinus rhythm QRS Axis: normal Intervals: normal ST/T Wave abnormalities: LVH with repolarization abnormality Narrative Interpretation: no evidence of acute ischemia; no significant change when compared to EKG of 02/04/2023    RADIOLOGY   PROCEDURES:  Critical Care performed: No  Procedures   MEDICATIONS ORDERED IN ED: Medications - No data to display   IMPRESSION / MDM / ASSESSMENT AND PLAN / ED COURSE  I reviewed the triage vital signs and the nursing notes.  22 year old female with PMH as noted above presents after an apparent seizure during dialysis.  The patient is alert and oriented and denies any acute symptoms at this time.  She has a flat affect and is minimally participatory during exam, however I am able to elicit a full neuroexam which is  nonfocal.  Differential diagnosis includes, but is not limited to, seizure disorder, pseudoseizure, syncope.  We will obtain basic labs and observe the patient in the ED.  Patient's presentation is most consistent with acute presentation with potential threat to life or bodily function.  The patient is on the cardiac monitor to evaluate for evidence of arrhythmia and/or significant heart rate changes.  ----------------------------------------- 12:33 PM on 05/25/2023 -----------------------------------------  The patient has remained asymptomatic without any recurrent seizure activity.  BMP shows elevated creatinine consistent with baseline and no other concerning findings.  CBC is significant for thrombocytopenia.  The patient has some chronic thrombocytopenia but this is worse than her baseline.  I consulted and discussed the case with Dr. Michae Kava from hematology who reviewed the patient's presentation and workup.    Aside from the thrombocytopenia, there is no indication to admit the patient at this time.  Dr. Michae Kava recommended starting the patient on a steroid course and advised that she would arrange for the patient have close follow-up in the next 1 to 2 days for repeat labs.  However discussing this with the patient and her mother, they state that the patient is getting an immune infusion in several days and was told to discontinue steroids.  Dr. Michae Kava recommended that in this case the patient follow-up for repeat labs and then steroids can be discussed as an outpatient.  I counseled the patient and her mother on the results of the workup, hematology recommendations, and overall plan of care.  I advised the patient to continue her Vimpat and follow-up with neurology.  I gave strict return precautions and they both expressed understanding.   FINAL CLINICAL IMPRESSION(S) / ED DIAGNOSES   Final diagnoses:  Seizure (HCC)  Thrombocytopenia (HCC)     Rx / DC Orders   ED Discharge  Orders     None        Note:  This document was prepared using Dragon voice recognition software and may include unintentional dictation errors.    Dionne Bucy, MD 05/26/23 [phone]

## 2023-05-25 NOTE — Progress Notes (Signed)
Received call from ED regarding platelets of 29.  Last month her platelets were normal.  Patient has history of lupus.  This is likely ITP.  We did send blood work for PT APTT, fibrinogen, LDH and have to.  Less likely to be TMA since his hemoglobin has been at baseline and creatinine is at baseline.  I offered Decadron 40 mg once daily for 4 days.  Patient reports that she is scheduled for an infusion next week and was advised not to take any steroid prior by her rheumatologist.  I spoke with Dr. Berton Lan rheumatology at [facility] and discussed the case.  She agrees with Decadron.  She also discussed about consideration of switching Anifrolumab infusion to rituximab now with ITP.  She will call the patient and discuss about that.  I was unable to reach the patient.  I reached out to the mother and explained the plan.  Sent the prescription for Decadron.

## 2023-05-26 ENCOUNTER — Inpatient Hospital Stay: Payer: Medicaid Other

## 2023-05-26 ENCOUNTER — Inpatient Hospital Stay: Payer: Medicaid Other | Attending: Internal Medicine | Admitting: Internal Medicine

## 2023-05-26 ENCOUNTER — Encounter: Payer: Self-pay | Admitting: Internal Medicine

## 2023-05-26 VITALS — BP 123/93 | HR 99 | Temp 98.7°F | Wt 107.0 lb

## 2023-05-26 DIAGNOSIS — Z79899 Other long term (current) drug therapy: Secondary | ICD-10-CM | POA: Insufficient documentation

## 2023-05-26 DIAGNOSIS — Z888 Allergy status to other drugs, medicaments and biological substances status: Secondary | ICD-10-CM | POA: Diagnosis not present

## 2023-05-26 DIAGNOSIS — Z992 Dependence on renal dialysis: Secondary | ICD-10-CM | POA: Diagnosis not present

## 2023-05-26 DIAGNOSIS — N186 End stage renal disease: Secondary | ICD-10-CM | POA: Insufficient documentation

## 2023-05-26 DIAGNOSIS — M3214 Glomerular disease in systemic lupus erythematosus: Secondary | ICD-10-CM | POA: Insufficient documentation

## 2023-05-26 DIAGNOSIS — D693 Immune thrombocytopenic purpura: Secondary | ICD-10-CM | POA: Insufficient documentation

## 2023-05-26 DIAGNOSIS — I120 Hypertensive chronic kidney disease with stage 5 chronic kidney disease or end stage renal disease: Secondary | ICD-10-CM | POA: Diagnosis not present

## 2023-05-26 DIAGNOSIS — Z83438 Family history of other disorder of lipoprotein metabolism and other lipidemia: Secondary | ICD-10-CM | POA: Insufficient documentation

## 2023-05-26 LAB — CBC WITH DIFFERENTIAL/PLATELET
Abs Immature Granulocytes: 0.03 10*3/uL (ref 0.00–0.07)
Basophils Absolute: 0 10*3/uL (ref 0.0–0.1)
Basophils Relative: 0 %
Eosinophils Absolute: 0.1 10*3/uL (ref 0.0–0.5)
Eosinophils Relative: 3 %
HCT: 35.9 % — ABNORMAL LOW (ref 36.0–46.0)
Hemoglobin: 10.7 g/dL — ABNORMAL LOW (ref 12.0–15.0)
Immature Granulocytes: 1 %
Lymphocytes Relative: 12 %
Lymphs Abs: 0.5 10*3/uL — ABNORMAL LOW (ref 0.7–4.0)
MCH: 26.4 pg (ref 26.0–34.0)
MCHC: 29.8 g/dL — ABNORMAL LOW (ref 30.0–36.0)
MCV: 88.4 fL (ref 80.0–100.0)
Monocytes Absolute: 0.1 10*3/uL (ref 0.1–1.0)
Monocytes Relative: 2 %
Neutro Abs: 3.7 10*3/uL (ref 1.7–7.7)
Neutrophils Relative %: 82 %
Platelets: 55 10*3/uL — ABNORMAL LOW (ref 150–400)
RBC: 4.06 MIL/uL (ref 3.87–5.11)
RDW: 16.8 % — ABNORMAL HIGH (ref 11.5–15.5)
Smear Review: NORMAL
WBC: 4.5 10*3/uL (ref 4.0–10.5)
nRBC: 0 % (ref 0.0–0.2)

## 2023-05-26 NOTE — Progress Notes (Signed)
Patient says that she is not as fatigued, hemo dialysis since end of May. So since she started that she says that she feels a lot better. One of the cath's is no longer inserted.

## 2023-05-27 LAB — HAPTOGLOBIN: Haptoglobin: 74 mg/dL (ref 33–278)

## 2023-05-30 ENCOUNTER — Inpatient Hospital Stay: Payer: Medicaid Other | Attending: Internal Medicine

## 2023-05-30 ENCOUNTER — Other Ambulatory Visit: Payer: Medicaid Other

## 2023-05-30 DIAGNOSIS — Z79899 Other long term (current) drug therapy: Secondary | ICD-10-CM | POA: Insufficient documentation

## 2023-05-30 DIAGNOSIS — D693 Immune thrombocytopenic purpura: Secondary | ICD-10-CM | POA: Insufficient documentation

## 2023-05-30 LAB — CBC WITH DIFFERENTIAL/PLATELET
Abs Immature Granulocytes: 0.11 10*3/uL — ABNORMAL HIGH (ref 0.00–0.07)
Basophils Absolute: 0 10*3/uL (ref 0.0–0.1)
Basophils Relative: 0 %
Eosinophils Absolute: 0 10*3/uL (ref 0.0–0.5)
Eosinophils Relative: 0 %
HCT: 32.4 % — ABNORMAL LOW (ref 36.0–46.0)
Hemoglobin: 9.8 g/dL — ABNORMAL LOW (ref 12.0–15.0)
Immature Granulocytes: 1 %
Lymphocytes Relative: 15 %
Lymphs Abs: 1.1 10*3/uL (ref 0.7–4.0)
MCH: 26.1 pg (ref 26.0–34.0)
MCHC: 30.2 g/dL (ref 30.0–36.0)
MCV: 86.2 fL (ref 80.0–100.0)
Monocytes Absolute: 0.7 10*3/uL (ref 0.1–1.0)
Monocytes Relative: 10 %
Neutro Abs: 5.7 10*3/uL (ref 1.7–7.7)
Neutrophils Relative %: 74 %
Platelets: 63 10*3/uL — ABNORMAL LOW (ref 150–400)
RBC: 3.76 MIL/uL — ABNORMAL LOW (ref 3.87–5.11)
RDW: 16.6 % — ABNORMAL HIGH (ref 11.5–15.5)
WBC: 7.7 10*3/uL (ref 4.0–10.5)
nRBC: 0.5 % — ABNORMAL HIGH (ref 0.0–0.2)

## 2023-06-13 DIAGNOSIS — D693 Immune thrombocytopenic purpura: Secondary | ICD-10-CM | POA: Insufficient documentation

## 2023-06-13 NOTE — Progress Notes (Signed)
[facility]  Telephone:([phone] [phone]  ID: Melinda Steele OB: 12-10-2000  MR#: [id number]  UVO#:[id number]  Patient Care Team: [facility] as PCP - General  REFERRING PROVIDER: Dr. Marisa Severin  REASON FOR REFERRAL: Thrombocytopenia  HPI: Melinda Steele is a 22 y.o. female with PMH of SLE, ESRD on HD, pericarditis, pericardial effusion with history of tamponade, AIHA, hypertension, press, seizures, vasculitis presented to [facility] ED on 05/25/2023 with concern for possible seizure.  Patient was in the hemodialysis and reports that the next thing she remembers was she was in the ED.  No other history available.  In the ER, patient was alert oriented.  On lab work, was incidentally found to have thrombocytopenia of 29, WBC 3.4 and hemoglobin of 11.  PT/INR normal, APTT 32, fibrinogen 234, LDH 171, haptoglobin 74, vitamin B12 316 and folate normal.  Patient has extensive rheumatology history and follows with [facility] Dr. Berton Lan.  Most recent admission was in May 2024 when patient resented with PD associated peritonitis.  She is planned to start Anifrolumab infusions with the worsening skin rash    REVIEW OF SYSTEMS:   ROS  As per HPI. Otherwise, a complete review of systems is negative.  PAST MEDICAL HISTORY: Past Medical History:  Diagnosis Date   Hypertension    Lupus (HCC)    Lupus nephritis (HCC)    Seizure (HCC)     PAST SURGICAL HISTORY: Past Surgical History:  Procedure Laterality Date   CAPD INSERTION N/A 08/27/2021   Procedure: LAPAROSCOPIC INSERTION CONTINUOUS AMBULATORY PERITONEAL DIALYSIS  (CAPD) CATHETER;  Surgeon: Leafy Ro, MD;  Location: [facility] ORS;  Service: General;  Laterality: N/A;   DIALYSIS/PERMA CATHETER INSERTION N/A 08/23/2021   Procedure: DIALYSIS/PERMA CATHETER INSERTION;  Surgeon: Annice Needy, MD;  Location: [facility] INVASIVE CV LAB;  Service: Cardiovascular;  Laterality: N/A;    FAMILY  HISTORY: Family History  Problem Relation Age of Onset   Hyperlipidemia Maternal Grandmother        a. many medical problems, unknown per prior note    HEALTH MAINTENANCE: Social History   Tobacco Use   Smoking status: Never   Smokeless tobacco: Never  Substance Use Topics   Alcohol use: Never   Drug use: Never     Allergies  Allergen Reactions   Amlodipine Swelling    Current Outpatient Medications  Medication Sig Dispense Refill   acetaminophen (TYLENOL) 500 MG tablet Take 1,000 mg by mouth every 8 (eight) hours as needed for mild pain.     BENLYSTA 200 MG/ML SOAJ Inject 200 mg into the skin once a week. Thursday     calcitRIOL (ROCALTROL) 0.5 MCG capsule Take 0.5 mcg by mouth daily.     calcium acetate (PHOSLO) 667 MG capsule Take 667 mg by mouth 3 (three) times daily with meals.     Cholecalciferol (VITAMIN D3) 10 MCG (400 UNIT) tablet Take 400 Units by mouth daily.     hydrOXYzine (ATARAX) 25 MG tablet Take 25 mg by mouth 2 (two) times daily as needed for anxiety.     lacosamide (VIMPAT) 200 MG TABS tablet Take 200 mg by mouth 2 (two) times daily.     metoprolol succinate (TOPROL-XL) 25 MG 24 hr tablet Take 25 mg by mouth daily.     Nutritional Supplements (FEEDING SUPPLEMENT, NEPRO CARB STEADY,) LIQD Take 237 mLs by mouth 3 (three) times daily between meals.  0   pantoprazole (PROTONIX) 40 MG tablet Take 1 tablet (40 mg  total) by mouth daily.     polyethylene glycol (MIRALAX / GLYCOLAX) 17 g packet Take 17 g by mouth daily as needed for moderate constipation. 14 each 0   promethazine (PHENERGAN) 12.5 MG tablet Take 12.5 mg by mouth every 6 (six) hours as needed for nausea or vomiting.     valsartan (DIOVAN) 80 MG tablet Take 80 mg by mouth daily.     carvedilol (COREG) 25 MG tablet Take 25 mg by mouth daily. (Patient not taking: Reported on 02/04/2023)     cloNIDine (CATAPRES - DOSED IN MG/24 HR) 0.3 mg/24hr patch Place 1 patch (0.3 mg total) onto the skin once a week.  (Patient not taking: Reported on 02/04/2023) 4 patch 12   hydroxychloroquine (PLAQUENIL) 200 MG tablet Take 400 mg by mouth daily. (Patient not taking: Reported on 02/04/2023)     isosorbide-hydrALAZINE (BIDIL) 20-37.5 MG tablet Take 1 tablet by mouth 3 (three) times daily. (Patient not taking: Reported on 02/04/2023)     methylPREDNISolone sodium succinate (SOLU-MEDROL) 125 mg/2 mL injection Inject 1.28 mLs (80 mg total) into the vein daily. Please taper slowly to her home dose (Patient not taking: Reported on 02/04/2023) 1 each 0   multivitamin (RENA-VIT) TABS tablet Take 1 tablet by mouth at bedtime. (Patient not taking: Reported on 02/04/2023)  0   oxyCODONE (ROXICODONE) 5 MG immediate release tablet Take 1 tablet (5 mg total) by mouth every 12 (twelve) hours as needed for severe pain or breakthrough pain. (Patient not taking: Reported on 02/04/2023) 10 tablet 0   pravastatin (PRAVACHOL) 20 MG tablet Take 20 mg by mouth daily. (Patient not taking: Reported on 02/04/2023)     predniSONE (DELTASONE) 20 MG tablet Take 20 mg by mouth daily with breakfast. (Patient not taking: Reported on 05/26/2023)     sacubitril-valsartan (ENTRESTO) 24-26 MG Take 1 tablet by mouth 2 (two) times daily. (Patient not taking: Reported on 02/04/2023) 60 tablet    spironolactone (ALDACTONE) 25 MG tablet Take 12.5 mg by mouth daily. (Patient not taking: Reported on 02/04/2023)     torsemide (DEMADEX) 20 MG tablet Take 40 mg by mouth daily. (Patient not taking: Reported on 02/04/2023)     No current facility-administered medications for this visit.    OBJECTIVE: Vitals:   05/26/23 1509  BP: (!) 123/93  Pulse: 99  Temp: 98.7 F (37.1 C)  SpO2: 96%     Body mass index is 17.27 kg/m.      General: Well-developed, well-nourished, no acute distress. Eyes: Pink conjunctiva, anicteric sclera. HEENT: Normocephalic, moist mucous membranes, clear oropharnyx. Lungs: Clear to auscultation bilaterally. Heart: Regular rate and  rhythm. No rubs, murmurs, or gallops. Abdomen: Soft, nontender, nondistended. No organomegaly noted, normoactive bowel sounds. Musculoskeletal: No edema, cyanosis, or clubbing. Neuro: Alert, answering all questions appropriately. Cranial nerves grossly intact. Skin: No rashes or petechiae noted. Psych: Normal affect. Lymphatics: No cervical, calvicular, axillary or inguinal LAD.   LAB RESULTS:  Lab Results  Component Value Date   NA 136 05/25/2023   K 3.5 05/25/2023   CL 93 (L) 05/25/2023   CO2 29 05/25/2023   GLUCOSE 92 05/25/2023   BUN 12 05/25/2023   CREATININE 3.96 (H) 05/25/2023   CALCIUM 8.8 (L) 05/25/2023   PROT 7.4 05/25/2023   ALBUMIN 3.3 (L) 05/25/2023   AST 26 05/25/2023   ALT 17 05/25/2023   ALKPHOS 87 05/25/2023   BILITOT 0.5 05/25/2023   GFRNONAA 16 (L) 05/25/2023    Lab Results  Component Value Date  WBC 7.7 05/30/2023   NEUTROABS 5.7 05/30/2023   HGB 9.8 (L) 05/30/2023   HCT 32.4 (L) 05/30/2023   MCV 86.2 05/30/2023   PLT 63 (L) 05/30/2023    No results found for: "TIBC", "FERRITIN", "IRONPCTSAT"   STUDIES: No results found.  ASSESSMENT AND PLAN:   Melinda Steele is a 22 y.o. female with pmh of SLE, ESRD on HD, pericarditis, pericardial effusion with history of tamponade, AIHA, hypertension, press, seizures, vasculitis presented to [facility] ED on 05/25/2023 with concern for possible seizure.  Patient was in the hemodialysis and reports that the next thing she remembers was she was in the ED.  No other history available.  In the ER, patient was alert oriented.  # SLE # Lupus nephritis on HD # History of AIHA # Thrombocytopenia -Patient presented to ED from the dialysis session with concern for possible seizure.  On lab work, was incidentally found to have thrombocytopenia of 29, WBC 3.4 and hemoglobin of 11.  PT/INR normal, APTT 32, fibrinogen 234, LDH 171, haptoglobin 74, vitamin B12 316 and folate normal.  -Case was discussed with the ED provider with  concern for ITP with other autoimmune history.  Was recommended Decadron 40 mg daily for 4 days.  Patient did express the concern that she is supposed to start aniforlumab infusions for her lupus on Tuesday and was advised not to be on any steroids.  I placed a call to [facility] and discussed the case with Dr. Berton Lan who agreed with the plan of treatment with Decadron.  She has also decided to switch the infusion to rituximab for dual effect on ITP and her lupus rash.  They will call her to discuss further.  -Seen in the clinic on 05/26/2023.  Platelets improved to 63.  Continue with Decadron 40 mg for 4 days. -Patient advised to have a lab check on Tuesday.  And then follow-up in 1 month.   Orders Placed This Encounter  Procedures   CBC with Differential/Platelet   CBC with Differential/Platelet   Patient expressed understanding and was in agreement with this plan. She also understands that She can call clinic at any time with any questions, concerns, or complaints.   I spent a total of 45 minutes reviewing chart data, face-to-face evaluation with the patient, counseling and coordination of care as detailed above.  Michaelyn Barter, MD   06/13/2023 8:31 AM

## 2023-06-22 ENCOUNTER — Other Ambulatory Visit: Payer: Self-pay | Admitting: *Deleted

## 2023-06-22 DIAGNOSIS — D693 Immune thrombocytopenic purpura: Secondary | ICD-10-CM

## 2023-06-22 NOTE — Progress Notes (Signed)
cvb

## 2023-06-23 ENCOUNTER — Inpatient Hospital Stay: Payer: Medicaid Other

## 2023-06-23 ENCOUNTER — Inpatient Hospital Stay: Payer: Medicaid Other | Admitting: Internal Medicine

## 2023-06-23 ENCOUNTER — Other Ambulatory Visit: Payer: Medicaid Other

## 2023-06-23 ENCOUNTER — Ambulatory Visit: Payer: Medicaid Other | Admitting: Internal Medicine

## 2023-06-26 ENCOUNTER — Encounter: Payer: Self-pay | Admitting: Internal Medicine

## 2023-11-23 ENCOUNTER — Other Ambulatory Visit
Admission: RE | Admit: 2023-11-23 | Discharge: 2023-11-23 | Disposition: A | Discharge status: Home / Self Care | Payer: Medicaid Other | Source: Ambulatory Visit | Attending: Nephrology | Admitting: Nephrology

## 2023-11-23 DIAGNOSIS — D631 Anemia in chronic kidney disease: Secondary | ICD-10-CM | POA: Insufficient documentation

## 2023-11-23 LAB — CBC WITH DIFFERENTIAL/PLATELET
Abs Immature Granulocytes: 0.04 K/uL (ref 0.00–0.07)
Basophils Absolute: 0 K/uL (ref 0.0–0.1)
Basophils Relative: 0 %
Eosinophils Absolute: 0.1 K/uL (ref 0.0–0.5)
Eosinophils Relative: 5 %
HCT: 24 % — ABNORMAL LOW (ref 36.0–46.0)
Hemoglobin: 7.3 g/dL — ABNORMAL LOW (ref 12.0–15.0)
Immature Granulocytes: 1 %
Lymphocytes Relative: 20 %
Lymphs Abs: 0.6 K/uL — ABNORMAL LOW (ref 0.7–4.0)
MCH: 26.1 pg (ref 26.0–34.0)
MCHC: 30.4 g/dL (ref 30.0–36.0)
MCV: 85.7 fL (ref 80.0–100.0)
Monocytes Absolute: 0.4 K/uL (ref 0.1–1.0)
Monocytes Relative: 15 %
Neutro Abs: 1.7 K/uL (ref 1.7–7.7)
Neutrophils Relative %: 59 %
Platelets: 152 K/uL (ref 150–400)
RBC: 2.8 MIL/uL — ABNORMAL LOW (ref 3.87–5.11)
RDW: 15.3 % (ref 11.5–15.5)
WBC: 2.9 K/uL — ABNORMAL LOW (ref 4.0–10.5)
nRBC: 0 % (ref 0.0–0.2)

## 2023-12-15 ENCOUNTER — Other Ambulatory Visit: Payer: Self-pay | Admitting: Internal Medicine

## 2023-12-15 DIAGNOSIS — D649 Anemia, unspecified: Secondary | ICD-10-CM

## 2023-12-18 ENCOUNTER — Other Ambulatory Visit: Payer: Self-pay

## 2023-12-18 ENCOUNTER — Encounter: Payer: Self-pay | Admitting: Internal Medicine

## 2023-12-18 ENCOUNTER — Inpatient Hospital Stay: Attending: Internal Medicine

## 2023-12-18 ENCOUNTER — Inpatient Hospital Stay: Admitting: Internal Medicine

## 2023-12-18 ENCOUNTER — Inpatient Hospital Stay (HOSPITAL_BASED_OUTPATIENT_CLINIC_OR_DEPARTMENT_OTHER): Admitting: Internal Medicine

## 2023-12-18 ENCOUNTER — Inpatient Hospital Stay

## 2023-12-18 VITALS — BP 90/68 | HR 115 | Temp 98.7°F | Resp 16 | Wt 103.5 lb

## 2023-12-18 DIAGNOSIS — Z992 Dependence on renal dialysis: Secondary | ICD-10-CM | POA: Diagnosis not present

## 2023-12-18 DIAGNOSIS — Z79899 Other long term (current) drug therapy: Secondary | ICD-10-CM | POA: Diagnosis not present

## 2023-12-18 DIAGNOSIS — R000 Tachycardia, unspecified: Secondary | ICD-10-CM | POA: Diagnosis not present

## 2023-12-18 DIAGNOSIS — Z79624 Long term (current) use of inhibitors of nucleotide synthesis: Secondary | ICD-10-CM | POA: Diagnosis not present

## 2023-12-18 DIAGNOSIS — I120 Hypertensive chronic kidney disease with stage 5 chronic kidney disease or end stage renal disease: Secondary | ICD-10-CM | POA: Diagnosis not present

## 2023-12-18 DIAGNOSIS — Z83438 Family history of other disorder of lipoprotein metabolism and other lipidemia: Secondary | ICD-10-CM | POA: Insufficient documentation

## 2023-12-18 DIAGNOSIS — D693 Immune thrombocytopenic purpura: Secondary | ICD-10-CM | POA: Insufficient documentation

## 2023-12-18 DIAGNOSIS — N186 End stage renal disease: Secondary | ICD-10-CM | POA: Insufficient documentation

## 2023-12-18 DIAGNOSIS — M3214 Glomerular disease in systemic lupus erythematosus: Secondary | ICD-10-CM | POA: Diagnosis not present

## 2023-12-18 DIAGNOSIS — R0602 Shortness of breath: Secondary | ICD-10-CM | POA: Diagnosis not present

## 2023-12-18 DIAGNOSIS — D649 Anemia, unspecified: Secondary | ICD-10-CM

## 2023-12-18 DIAGNOSIS — D638 Anemia in other chronic diseases classified elsewhere: Secondary | ICD-10-CM | POA: Diagnosis not present

## 2023-12-18 DIAGNOSIS — Z888 Allergy status to other drugs, medicaments and biological substances status: Secondary | ICD-10-CM | POA: Insufficient documentation

## 2023-12-18 LAB — CBC WITH DIFFERENTIAL/PLATELET
Abs Immature Granulocytes: 0.04 10*3/uL (ref 0.00–0.07)
Basophils Absolute: 0 10*3/uL (ref 0.0–0.1)
Basophils Relative: 0 %
Eosinophils Absolute: 0.1 10*3/uL (ref 0.0–0.5)
Eosinophils Relative: 2 %
HCT: 16.8 % — ABNORMAL LOW (ref 36.0–46.0)
Hemoglobin: 5 g/dL — CL (ref 12.0–15.0)
Immature Granulocytes: 1 %
Lymphocytes Relative: 20 %
Lymphs Abs: 0.7 10*3/uL (ref 0.7–4.0)
MCH: 25.6 pg — ABNORMAL LOW (ref 26.0–34.0)
MCHC: 29.8 g/dL — ABNORMAL LOW (ref 30.0–36.0)
MCV: 86.2 fL (ref 80.0–100.0)
Monocytes Absolute: 0.2 10*3/uL (ref 0.1–1.0)
Monocytes Relative: 6 %
Neutro Abs: 2.5 10*3/uL (ref 1.7–7.7)
Neutrophils Relative %: 71 %
Platelets: 159 10*3/uL (ref 150–400)
RBC: 1.95 MIL/uL — ABNORMAL LOW (ref 3.87–5.11)
RDW: 15 % (ref 11.5–15.5)
Smear Review: NORMAL
WBC: 3.5 10*3/uL — ABNORMAL LOW (ref 4.0–10.5)
nRBC: 0 % (ref 0.0–0.2)

## 2023-12-18 LAB — SAMPLE TO BLOOD BANK

## 2023-12-18 LAB — BILIRUBIN, TOTAL AND DIRECT (CANCER CENTER ONLY)
Bilirubin, Direct: <0.1 mg/dL (ref 0.0–0.2)
Total Bilirubin: 0.7 mg/dL (ref 0.0–1.2)

## 2023-12-18 LAB — VITAMIN B12: Vitamin B-12: 285 pg/mL (ref 180–914)

## 2023-12-18 LAB — LACTATE DEHYDROGENASE: LDH: 125 U/L (ref 98–192)

## 2023-12-18 NOTE — Progress Notes (Signed)
 [facility]  Telephone:(336) [phone] [phone]) [phone]  ID: Melinda Steele OB: [birth year]  MR#: [id number]  QIO#:[id number]  Patient Care Team: [facility] as PCP - General Michaelyn Barter, MD as Consulting Physician (Oncology)  REFERRING PROVIDER: Dr. Marisa Severin  REASON FOR REFERRAL: anemia  HPI: Melinda Steele is a 23 y.o. female with PMH of SLE, ESRD on HD, pericarditis, pericardial effusion with history of tamponade, AIHA, hypertension, press, seizures, vasculitis presented to [facility] ED on 05/25/2023 with concern for possible seizure.  Patient was in the hemodialysis and reports that the next thing she remembers was she was in the ED.  No other history available.  In the ER, patient was alert oriented.  On lab work, was incidentally found to have thrombocytopenia of 29, WBC 3.4 and hemoglobin of 11.  PT/INR normal, APTT 32, fibrinogen 234, LDH 171, haptoglobin 74, vitamin B12 316 and folate normal.  Patient has extensive rheumatology history and follows with [facility] Dr. Berton Lan.  Most recent admission was in May 2024 when patient resented with PD associated peritonitis.  Patient was no-show for subsequent appointments.  She was then referred back to hematology by Dr. Monika Salk, nephrologist for worsening anemia.  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  Per patient, had bad skin rash.  She is currently on Myfortic 360 mg twice daily.  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   Interval history Patient was seen today as follow-up accompanied by mother for anemia and labs. She has been having shortness of breath and lightheadedness.  In clinic, she was tachycardic.  REVIEW OF SYSTEMS:   ROS  As per HPI. Otherwise, a complete review of systems is negative.  PAST MEDICAL HISTORY: Past Medical History:  Diagnosis Date   Hypertension    Lupus    Lupus nephritis (HCC)    Seizure  (HCC)     PAST SURGICAL HISTORY: Past Surgical History:  Procedure Laterality Date   CAPD INSERTION N/A 08/27/2021   Procedure: LAPAROSCOPIC INSERTION CONTINUOUS AMBULATORY PERITONEAL DIALYSIS  (CAPD) CATHETER;  Surgeon: Leafy Ro, MD;  Location: [facility] ORS;  Service: General;  Laterality: N/A;   DIALYSIS/PERMA CATHETER INSERTION N/A 08/23/2021   Procedure: DIALYSIS/PERMA CATHETER INSERTION;  Surgeon: Annice Needy, MD;  Location: [facility] INVASIVE CV LAB;  Service: Cardiovascular;  Laterality: N/A;    FAMILY HISTORY: Family History  Problem Relation Age of Onset   Hyperlipidemia Maternal Grandmother        a. many medical problems, unknown per prior note    HEALTH MAINTENANCE: Social History   Tobacco Use   Smoking status: Never   Smokeless tobacco: Never  Substance Use Topics   Alcohol use: Never   Drug use: Never     Allergies  Allergen Reactions   Amlodipine Swelling    Current Outpatient Medications  Medication Sig Dispense Refill   acetaminophen (TYLENOL) 500 MG tablet Take 1,000 mg by mouth every 8 (eight) hours as needed for mild pain.     hydroxychloroquine (PLAQUENIL) 200 MG tablet Take 400 mg by mouth daily.     lacosamide (VIMPAT) 200 MG TABS tablet Take 200 mg by mouth 2 (two) times daily.     metoprolol succinate (TOPROL-XL) 25 MG 24 hr tablet Take 25 mg by mouth daily.     Nutritional Supplements (FEEDING SUPPLEMENT, NEPRO CARB STEADY,) LIQD Take 237 mLs by mouth 3 (three) times daily between meals.  0  pantoprazole (PROTONIX) 40 MG tablet Take 1 tablet (40 mg total) by mouth daily.     polyethylene glycol (MIRALAX / GLYCOLAX) 17 g packet Take 17 g by mouth daily as needed for moderate constipation. 14 each 0   BENLYSTA 200 MG/ML SOAJ Inject 200 mg into the skin once a week. Thursday (Patient not taking: Reported on 12/18/2023)     calcitRIOL (ROCALTROL) 0.5 MCG capsule Take 0.5 mcg by mouth daily. (Patient not taking: Reported on 12/18/2023)     calcium  acetate (PHOSLO) 667 MG capsule Take 667 mg by mouth 3 (three) times daily with meals. (Patient not taking: Reported on 12/18/2023)     carvedilol (COREG) 25 MG tablet Take 25 mg by mouth daily. (Patient not taking: Reported on 02/04/2023)     Cholecalciferol (VITAMIN D3) 10 MCG (400 UNIT) tablet Take 400 Units by mouth daily. (Patient not taking: Reported on 12/18/2023)     cloNIDine (CATAPRES - DOSED IN MG/24 HR) 0.3 mg/24hr patch Place 1 patch (0.3 mg total) onto the skin once a week. (Patient not taking: Reported on 02/04/2023) 4 patch 12   hydrOXYzine (ATARAX) 25 MG tablet Take 25 mg by mouth 2 (two) times daily as needed for anxiety. (Patient not taking: Reported on 12/18/2023)     isosorbide-hydrALAZINE (BIDIL) 20-37.5 MG tablet Take 1 tablet by mouth 3 (three) times daily. (Patient not taking: Reported on 02/04/2023)     levETIRAcetam (KEPPRA) 500 MG tablet Take 500 mg by mouth daily.     methylPREDNISolone sodium succinate (SOLU-MEDROL) 125 mg/2 mL injection Inject 1.28 mLs (80 mg total) into the vein daily. Please taper slowly to her home dose (Patient not taking: Reported on 02/04/2023) 1 each 0   multivitamin (RENA-VIT) TABS tablet Take 1 tablet by mouth at bedtime. (Patient not taking: Reported on 02/04/2023)  0   Mycophenolate Sodium (MYCOPHENOLIC ACID) 360 MG TBEC Take 1 tablet by mouth 2 (two) times daily.     pravastatin (PRAVACHOL) 20 MG tablet Take 20 mg by mouth daily. (Patient not taking: Reported on 02/04/2023)     predniSONE (DELTASONE) 20 MG tablet Take 20 mg by mouth daily with breakfast. (Patient not taking: Reported on 05/26/2023)     promethazine (PHENERGAN) 12.5 MG tablet Take 12.5 mg by mouth every 6 (six) hours as needed for nausea or vomiting. (Patient not taking: Reported on 12/18/2023)     sacubitril-valsartan (ENTRESTO) 24-26 MG Take 1 tablet by mouth 2 (two) times daily. (Patient not taking: Reported on 02/04/2023) 60 tablet    spironolactone (ALDACTONE) 25 MG tablet Take 12.5  mg by mouth daily. (Patient not taking: Reported on 02/04/2023)     torsemide (DEMADEX) 20 MG tablet Take 40 mg by mouth daily. (Patient not taking: Reported on 02/04/2023)     valsartan (DIOVAN) 80 MG tablet Take 80 mg by mouth daily. (Patient not taking: Reported on 12/18/2023)     No current facility-administered medications for this visit.    OBJECTIVE: Vitals:   12/18/23 1527  BP: 90/68  Pulse: (!) 115  Resp: 16  Temp: 98.7 F (37.1 C)  SpO2: 98%     Body mass index is 16.71 kg/m.      General: Well-developed, well-nourished, no acute distress. Eyes: Pink conjunctiva, anicteric sclera. HEENT: Normocephalic, moist mucous membranes, clear oropharnyx. Lungs: Clear to auscultation bilaterally. Heart: Regular rate and rhythm. No rubs, murmurs, or gallops. Abdomen: Soft, nontender, nondistended. No organomegaly noted, normoactive bowel sounds. Musculoskeletal: No edema, cyanosis, or clubbing. Neuro: Alert,  answering all questions appropriately. Cranial nerves grossly intact. Skin: No rashes or petechiae noted. Psych: Normal affect. Lymphatics: No cervical, calvicular, axillary or inguinal LAD.   LAB RESULTS:  Lab Results  Component Value Date   NA 136 05/25/2023   K 3.5 05/25/2023   CL 93 (L) 05/25/2023   CO2 29 05/25/2023   GLUCOSE 92 05/25/2023   BUN 12 05/25/2023   CREATININE 3.96 (H) 05/25/2023   CALCIUM 8.8 (L) 05/25/2023   PROT 7.4 05/25/2023   ALBUMIN 3.3 (L) 05/25/2023   AST 26 05/25/2023   ALT 17 05/25/2023   ALKPHOS 87 05/25/2023   BILITOT 0.7 12/18/2023   GFRNONAA 16 (L) 05/25/2023    Lab Results  Component Value Date   WBC 3.5 (L) 12/18/2023   NEUTROABS 2.5 12/18/2023   HGB 5.0 (LL) 12/18/2023   HCT 16.8 (L) 12/18/2023   MCV 86.2 12/18/2023   PLT 159 12/18/2023    No results found for: "TIBC", "FERRITIN", "IRONPCTSAT"   STUDIES: No results found.  ASSESSMENT AND PLAN:   Melinda Steele is a 23 y.o. female with pmh of SLE, ESRD on HD,  pericarditis, pericardial effusion with history of tamponade, AIHA, hypertension, press, seizures, vasculitis presented to [facility] ED on 05/25/2023 with concern for possible seizure.  Patient was in the hemodialysis and reports that the next thing she remembers was she was in the ED.  No other history available.  In the ER, patient was alert oriented.  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  Currently on Myfortic 360 mg twice daily and prednisone.  She was recently delisted from kidney transplant due to uncontrolled lupus.  Patient was previously seen by [place] for ITP. S/p Decadron 40 mg for 4 days.  At that time, case was discussed with Dr. Berton Lan. S/p 1 dose of rituximab 1000 mg both for lupus control and ITP.  Upset that we agreed to give her Rituximab and patient had bad reaction with skin rash. No-show for follow-up appointments with [place] after that.    -Referred back to [facility] hematology for worsening anemia.  No appointments available with [facility] benign hematology until June 2025. Labs reviewed from [facility].  Ferritin 1282, saturation 58%, folate more than 24, hemoglobin was 7.3 from 11/28/2023.  LDH normal.  Bilirubin normal.  Haptoglobin is pending.  B12 pending.  Will add reticulocyte.  Hemoglobin today is 5.  Denies any bleeding.  Patient is symptomatic with shortness of breath, dizziness and tachycardic.  She does have history of chronic anemia however recently, her hemoglobin has worsened.  On maximum dose of Mircera 225 mcg every 2 weeks.  IV iron per nephrology.  -Potential etiology for anemia include ESRD, ESA hyporesponsiveness, lupus itself causing anemia of chronic disease and Myfortic.  Hemolytic panel so far has been negative.  I have not repeated the recent labs she had at [facility] to avoid iatrogenic worsening of anemia.  -With hemoglobin of 5, the priority was to stabilize her.   Recommendation was to go to emergency room for blood transfusion.  Patient and daughter were upset on multiple occasions during the conversation such as needing a blood transfusion which will make the process of finding a kidney difficult for her, recommendation of going to emergency room, not been able to arrange outpatient urgent blood transfusion today itself, having to wait until Wednesday for outpatient transfusion, possibly not able to access  her dialysis catheter for a transfusion by our nurses, not able to get blood transfusion at her dialysis and several other reasons.  During the conversation, unpleasant words spoken by the family.  We tried to discuss that as we are doing other workup to figure out what is going on with her anemia we also need to get her into transfusion.  Once today's workup is negative, consider checking for less common vitamin deficiencies such as B1, B6, copper level.  I could not get into the conversation of potential for bone marrow biopsy to further evaluate.  Eventually, the patient and family left the room and did not schedule any follow-ups for the blood transfusion.  We will hold chair for 2 units of PRBC on Wednesday as a backup.  My team will reach out to her again tomorrow to find out if they are willing to come.  No further follow-ups have been scheduled at this time.  Patient wanted to speak with her rheumatologist.   Orders Placed This Encounter  Procedures   Reticulocytes   Patient expressed understanding and was in agreement with this plan. She also understands that She can call clinic at any time with any questions, concerns, or complaints.   I spent a total of 30 minutes reviewing chart data, face-to-face evaluation with the patient, counseling and coordination of care as detailed above.  Michaelyn Barter, MD   12/18/2023 5:53 PM

## 2023-12-18 NOTE — Progress Notes (Signed)
 1600-Patient appeared frustrated at the end of the visit about the plan of care. She did not stop at the receptionist desk to get her blood transfusion apt. We will touch base with patient tomorrow on her decision whether or not to proceed with a blood transfusion on Wednesday. [facility]

## 2023-12-18 NOTE — Progress Notes (Signed)
 Critical value reported at 1541 by [facility] in cancer center lab. Hgb 5.0. read back process performed with [place].  1541-Dr. Alena Bills present at time of phone call who also heard the verbal lab report at the time of the call. Will plan for blood transfusion as soon as possible per md and she will speak to the patient.

## 2023-12-18 NOTE — Progress Notes (Signed)
 Patient is here with her mother, her mom was saying that the Dialysis people are the ones whom wanted her to come here to see about finding a work around her having to get a blood transfusion, due to her transplant she is needing, something about blood transfusion changes the DNA. So a blood transfusion is off the table unless it is absolutely a last resort.

## 2023-12-19 ENCOUNTER — Other Ambulatory Visit: Payer: Self-pay

## 2023-12-19 ENCOUNTER — Other Ambulatory Visit: Payer: Self-pay | Admitting: *Deleted

## 2023-12-19 ENCOUNTER — Telehealth: Payer: Self-pay

## 2023-12-19 ENCOUNTER — Telehealth: Payer: Self-pay | Admitting: *Deleted

## 2023-12-19 DIAGNOSIS — D649 Anemia, unspecified: Secondary | ICD-10-CM

## 2023-12-19 DIAGNOSIS — D693 Immune thrombocytopenic purpura: Secondary | ICD-10-CM

## 2023-12-19 DIAGNOSIS — D638 Anemia in other chronic diseases classified elsewhere: Secondary | ICD-10-CM

## 2023-12-19 LAB — HAPTOGLOBIN: Haptoglobin: 152 mg/dL (ref 33–278)

## 2023-12-19 LAB — RETICULOCYTES
Immature Retic Fract: 10.5 % (ref 2.3–15.9)
RBC.: 1.95 MIL/uL — ABNORMAL LOW (ref 3.87–5.11)
Retic Count, Absolute: 13.1 K/uL — ABNORMAL LOW (ref 19.0–186.0)
Retic Ct Pct: 0.7 % (ref 0.4–3.1)

## 2023-12-19 LAB — PREPARE RBC (CROSSMATCH)

## 2023-12-19 NOTE — Telephone Encounter (Signed)
 Attempted to reach patient. No answer. Vm box not set up. Tiffany, cma was able to speak to patient/mom. Pt/mom asked for plan of care prior to agreeing to the apt for blood tomorrow. Discussed care with Dr. Mervyn Skeeters-  Per DR. A - 2 units should bring her up to 7. Work up so far, came back negative except mildly low b12 which she can take over the counter. The nephrologist is already sending a referral to [facility] benign heme. We are working on getting an Nature conservation officer from [place]. The turn-around time is approximately 3-5 days. She will message Dr. Harlow Ohms at [facility] to see what options we have. Dr. Mervyn Skeeters has also been consulting with her nephrologist to collaborate care.   We recommend that we recheck blood counts in 2 weeks and hold chair for possible blood transfusion the next day.  When pt comes in for labs in 2 weeks, Ft. A will check additional labs work up to determine the etiology of the anemia as so far the work up has been negative. Dr. Mervyn Skeeters does not need to see pt on the 10th.

## 2023-12-19 NOTE — Telephone Encounter (Signed)
 Spoke with patient and family. Discussed plan of care with pt and family in detail. patient agreeable to blood transfusion. Orders entered.  Msg sent to scheduling to arrange for lab/possible blood in 2 weeks. And to cnl apt on the 10th with Dr. Mervyn Skeeters.

## 2023-12-19 NOTE — Addendum Note (Signed)
 Addended byMichaelyn Barter on: 12/19/2023 04:55 PM   Modules accepted: Orders

## 2023-12-19 NOTE — Telephone Encounter (Signed)
 Called and spoke with patient in regards to Dr. Alena Bills wanting the patient to come in tomorrow for 2 units of blood, but the patient never gave [place] a straight answer yesterday wether or not she was going to go along with Dr. Alan Ripper plan. So I spoke with the patient and her mother, I asked them if the patient was going to come in tomorrow to get the blood, I heard the mother ask the daughter (the patient) what are you going to do, the patient asked me what Dr. Alan Ripper plan is if and after she comes in for the units of blood. I couldn't give her an answer so I told her that I could ask Dr. Alena Bills and then call her back. So after  I got off the phone with patient I stopped and talked with Dr. Alena Bills. Dr. Alena Bills told me that she is in the process of getting patient in at [facility]

## 2023-12-20 ENCOUNTER — Inpatient Hospital Stay

## 2023-12-20 ENCOUNTER — Ambulatory Visit

## 2023-12-20 DIAGNOSIS — D649 Anemia, unspecified: Secondary | ICD-10-CM | POA: Diagnosis not present

## 2023-12-20 DIAGNOSIS — D638 Anemia in other chronic diseases classified elsewhere: Secondary | ICD-10-CM

## 2023-12-20 MED ORDER — SODIUM CHLORIDE 0.9% IV SOLUTION
250.0000 mL | INTRAVENOUS | Status: DC
  Administered 2023-12-20: 100 mL via INTRAVENOUS
  Filled 2023-12-20: qty 250

## 2023-12-20 MED ORDER — DIPHENHYDRAMINE HCL 25 MG PO CAPS
25.0000 mg | ORAL_CAPSULE | Freq: Once | ORAL | Status: AC
  Administered 2023-12-20: 25 mg via ORAL
  Filled 2023-12-20: qty 1

## 2023-12-20 MED ORDER — SODIUM CHLORIDE 0.9% IV SOLUTION
250.0000 mL | INTRAVENOUS | Status: DC
  Filled 2023-12-20: qty 250

## 2023-12-20 MED ORDER — ACETAMINOPHEN 325 MG PO TABS
650.0000 mg | ORAL_TABLET | Freq: Once | ORAL | Status: AC
  Administered 2023-12-20: 650 mg via ORAL
  Filled 2023-12-20: qty 2

## 2023-12-21 LAB — TYPE AND SCREEN
ABO/RH(D): B POS
Antibody Screen: NEGATIVE
Unit division: 0
Unit division: 0

## 2023-12-21 LAB — BPAM RBC
Blood Product Expiration Date: 202504192359
Blood Product Unit Number: 202504082359
ISSUE DATE / TIME: 202503260958
PRODUCT CODE: 202503261143
PRODUCT CODE: 202504192359
Unit Type and Rh: 7300
Unit Type and Rh: 1700
Unit Type and Rh: 202504082359
Unit Type and Rh: 7300

## 2024-01-02 ENCOUNTER — Inpatient Hospital Stay

## 2024-01-03 ENCOUNTER — Inpatient Hospital Stay

## 2024-01-04 ENCOUNTER — Ambulatory Visit: Admitting: Internal Medicine

## 2024-01-11 ENCOUNTER — Other Ambulatory Visit: Payer: Self-pay

## 2024-01-11 ENCOUNTER — Other Ambulatory Visit: Payer: Self-pay | Admitting: *Deleted

## 2024-01-11 ENCOUNTER — Telehealth: Payer: Self-pay | Admitting: *Deleted

## 2024-01-11 ENCOUNTER — Inpatient Hospital Stay: Attending: Internal Medicine

## 2024-01-11 ENCOUNTER — Other Ambulatory Visit
Admission: RE | Admit: 2024-01-11 | Discharge: 2024-01-11 | Disposition: A | Discharge status: Home / Self Care | Attending: Nephrology | Admitting: Nephrology

## 2024-01-11 ENCOUNTER — Telehealth: Payer: Self-pay

## 2024-01-11 DIAGNOSIS — Z79899 Other long term (current) drug therapy: Secondary | ICD-10-CM | POA: Insufficient documentation

## 2024-01-11 DIAGNOSIS — D649 Anemia, unspecified: Secondary | ICD-10-CM | POA: Diagnosis present

## 2024-01-11 DIAGNOSIS — D638 Anemia in other chronic diseases classified elsewhere: Secondary | ICD-10-CM

## 2024-01-11 DIAGNOSIS — D631 Anemia in chronic kidney disease: Secondary | ICD-10-CM | POA: Insufficient documentation

## 2024-01-11 LAB — CBC WITH DIFFERENTIAL (CANCER CENTER ONLY)
Abs Immature Granulocytes: 0.05 10*3/uL (ref 0.00–0.07)
Basophils Absolute: 0 10*3/uL (ref 0.0–0.1)
Basophils Relative: 0 %
Eosinophils Absolute: 0 10*3/uL (ref 0.0–0.5)
Eosinophils Relative: 1 %
HCT: 21.1 % — ABNORMAL LOW (ref 36.0–46.0)
Hemoglobin: 6.4 g/dL — CL (ref 12.0–15.0)
Immature Granulocytes: 1 %
Lymphocytes Relative: 9 %
Lymphs Abs: 0.5 10*3/uL — ABNORMAL LOW (ref 0.7–4.0)
MCH: 27 pg (ref 26.0–34.0)
MCHC: 30.3 g/dL (ref 30.0–36.0)
MCV: 89 fL (ref 80.0–100.0)
Monocytes Absolute: 0.4 10*3/uL (ref 0.1–1.0)
Monocytes Relative: 7 %
Neutro Abs: 4.3 10*3/uL (ref 1.7–7.7)
Neutrophils Relative %: 82 %
Platelet Count: 175 10*3/uL (ref 150–400)
RBC: 2.37 MIL/uL — ABNORMAL LOW (ref 3.87–5.11)
RDW: 13.6 % (ref 11.5–15.5)
WBC Count: 5.2 10*3/uL (ref 4.0–10.5)
nRBC: 0 % (ref 0.0–0.2)

## 2024-01-11 LAB — PREPARE RBC (CROSSMATCH)

## 2024-01-11 LAB — HEMOGLOBIN: Hemoglobin: 5.7 g/dL — ABNORMAL LOW (ref 12.0–15.0)

## 2024-01-11 NOTE — Telephone Encounter (Signed)
Blood orders entered. 

## 2024-01-11 NOTE — Telephone Encounter (Signed)
 Blood order and lab encounter put in

## 2024-01-11 NOTE — Telephone Encounter (Signed)
 Critical value reported at 1416. Hgb 6.4 reported by Micheline Ahr in cancer [facility] lab. Read back process performed. 1420-Per Dr. Aris Bel, aware of critical value. Read back process performed. pt will require 1 unit of blood tomorrow. Pt already scheduled for blood. Blood orders placed and released.

## 2024-01-11 NOTE — Telephone Encounter (Signed)
 Per Burdette Carolin RN- patient called - "would like to know if she can still get the appointment for tomorrow for her blood transfusion. She would like a call back please."  Call returned. Patient r/s

## 2024-01-11 NOTE — Telephone Encounter (Signed)
-----   Message from Kiowa C sent at 01/11/2024 10:36 AM EDT ----- Regarding: RE: Schedule for blood transfusion Called patient to schedule- she states she is not in agreement for blood this week. She states she told the doctor that she would like to wait until next week. I told her I would send this message and someone would reach out to her.   I will hold chair until lunch for her for tomorrow . Please let me know if it needs to be cancelled . ----- Message ----- From: Agrawal, Kavita, MD Sent: 01/11/2024  10:24 AM EDT To: Queenie Brunet, RN; Germaine Kohut; # Subject: Schedule for blood transfusion                 Hello team,   The patient's nephrologist reached out to me that her hemoglobin has dropped again to 6 and requested to schedule a 1 unit prbc transfusion.   She spoke with the patient, who is agreeable with the plan and is expecting a call from us .   Felipa Horsfall- please go ahead and schedule for 1 unit blood tomorrow.   Heather/ Schawn Byas - She will need to come today for labs today. Could you please order that and 1 unit blood.  Thank you  Kavita

## 2024-01-12 ENCOUNTER — Inpatient Hospital Stay

## 2024-01-12 ENCOUNTER — Telehealth: Payer: Self-pay

## 2024-01-12 ENCOUNTER — Ambulatory Visit

## 2024-01-12 DIAGNOSIS — D649 Anemia, unspecified: Secondary | ICD-10-CM | POA: Diagnosis not present

## 2024-01-12 DIAGNOSIS — D638 Anemia in other chronic diseases classified elsewhere: Secondary | ICD-10-CM

## 2024-01-12 MED ORDER — ACETAMINOPHEN 325 MG PO TABS
650.0000 mg | ORAL_TABLET | Freq: Once | ORAL | Status: AC
Start: 2024-01-12 — End: 2024-01-12
  Administered 2024-01-12: 650 mg via ORAL
  Filled 2024-01-12: qty 2

## 2024-01-12 MED ORDER — SODIUM CHLORIDE 0.9% IV SOLUTION
250.0000 mL | INTRAVENOUS | Status: DC
  Administered 2024-01-12: 100 mL via INTRAVENOUS
  Filled 2024-01-12: qty 250

## 2024-01-12 MED ORDER — DIPHENHYDRAMINE HCL 25 MG PO CAPS
25.0000 mg | ORAL_CAPSULE | Freq: Once | ORAL | Status: AC
  Administered 2024-01-12: 25 mg via ORAL
  Filled 2024-01-12: qty 1

## 2024-01-12 NOTE — Telephone Encounter (Signed)
 Patient was supposed to be here at 1:00 pm on 01/12/2024 for her blood transfusion, so after about 30 minutes and the patient still hasn't arrived Melinda Steele wanted me to call the patient to see what was going on and see where she was. I called and the patients mother answered the phone, I asked her is she was ok and why she wasn't here and the mother told me that she was here. So while on the phone with the mother I asked her how her daughters appointments were going at [facility], which is were she is supposed to be going for her treatments and stuff, she said that they are waiting to hear back about her most recent lab results. I asked the patient herself while in the infusion chair about wether or not she was going to stay at [facility] for her care or keep coming here and she told me that she would be staying with [facility] from here on out.

## 2024-01-12 NOTE — Patient Instructions (Signed)

## 2024-01-13 LAB — BPAM RBC
Blood Product Expiration Date: 202505132359
ISSUE DATE / TIME: 202504181418
Unit Type and Rh: 7300

## 2024-01-13 LAB — TYPE AND SCREEN
ABO/RH(D): B POS
Antibody Screen: NEGATIVE
Unit division: 0

## 2024-01-16 ENCOUNTER — Telehealth: Payer: Self-pay | Admitting: *Deleted

## 2024-01-16 NOTE — Telephone Encounter (Signed)
 Per Dr. Alana Hoyle - patient  does not need to return to clinic to be established with [facility] hematology. Per md - Nephrologist reached out to Dr. Alana Hoyle last week requesting if cancer center could accommodate a unit of blood. There were no spots available at [facility] for that week. So, Dr. Alana Hoyle agreed for this one time. Per md - Moving forward, patient does not need to establish with a different Hematologist at [facility]. Dr. Alana Hoyle will send a msg back to patient's nephrologist that DR. A is leaving. Patient will not be connected to a different doc at [facility] since she has established with [facility]. Per md - we will not be able to accommodate any blood transfusion moving forward. Pt will go to [facility] for her care.

## 2024-01-25 ENCOUNTER — Other Ambulatory Visit
Admission: RE | Admit: 2024-01-25 | Discharge: 2024-01-25 | Disposition: A | Discharge status: Home / Self Care | Source: Ambulatory Visit | Attending: Nephrology | Admitting: Nephrology

## 2024-01-25 DIAGNOSIS — D631 Anemia in chronic kidney disease: Secondary | ICD-10-CM | POA: Insufficient documentation

## 2024-01-25 LAB — HEMOGLOBIN: Hemoglobin: 6.6 g/dL — ABNORMAL LOW (ref 12.0–15.0)

## 2024-03-18 ENCOUNTER — Emergency Department

## 2024-03-18 ENCOUNTER — Other Ambulatory Visit: Payer: Self-pay

## 2024-03-18 ENCOUNTER — Emergency Department: Admission: EM | Admit: 2024-03-18 | Discharge: 2024-03-18 | Disposition: A | Discharge status: Home / Self Care

## 2024-03-18 ENCOUNTER — Emergency Department: Admit: 2024-03-18 | Discharge: 2024-03-18 | Disposition: A | Discharge status: Home / Self Care

## 2024-03-18 DIAGNOSIS — N186 End stage renal disease: Secondary | ICD-10-CM | POA: Diagnosis not present

## 2024-03-18 DIAGNOSIS — Z992 Dependence on renal dialysis: Secondary | ICD-10-CM | POA: Insufficient documentation

## 2024-03-18 DIAGNOSIS — R079 Chest pain, unspecified: Secondary | ICD-10-CM | POA: Insufficient documentation

## 2024-03-18 DIAGNOSIS — M329 Systemic lupus erythematosus, unspecified: Secondary | ICD-10-CM | POA: Insufficient documentation

## 2024-03-18 DIAGNOSIS — D696 Thrombocytopenia, unspecified: Secondary | ICD-10-CM | POA: Diagnosis not present

## 2024-03-18 DIAGNOSIS — I509 Heart failure, unspecified: Secondary | ICD-10-CM | POA: Insufficient documentation

## 2024-03-18 LAB — CBC
HCT: 41 % (ref 36.0–46.0)
Hemoglobin: 12.4 g/dL (ref 12.0–15.0)
MCH: 27.7 pg (ref 26.0–34.0)
MCHC: 30.2 g/dL (ref 30.0–36.0)
MCV: 91.5 fL (ref 80.0–100.0)
Platelets: 88 10*3/uL — ABNORMAL LOW (ref 150–400)
RBC: 4.48 MIL/uL (ref 3.87–5.11)
RDW: 17.1 % — ABNORMAL HIGH (ref 11.5–15.5)
WBC: 7.7 10*3/uL (ref 4.0–10.5)
nRBC: 0 % (ref 0.0–0.2)

## 2024-03-18 LAB — BASIC METABOLIC PANEL WITH GFR
Anion gap: 17 — ABNORMAL HIGH (ref 5–15)
BUN: 48 mg/dL — ABNORMAL HIGH (ref 6–20)
CO2: 25 mmol/L (ref 22–32)
Calcium: 8.7 mg/dL — ABNORMAL LOW (ref 8.9–10.3)
Chloride: 92 mmol/L — ABNORMAL LOW (ref 98–111)
Creatinine, Ser: 11.3 mg/dL — ABNORMAL HIGH (ref 0.44–1.00)
GFR, Estimated: 4 mL/min — ABNORMAL LOW (ref >60)
Glucose, Bld: 83 mg/dL (ref 70–99)
Potassium: 4.7 mmol/L (ref 3.5–5.1)
Sodium: 134 mmol/L — ABNORMAL LOW (ref 135–145)

## 2024-03-18 LAB — ECHOCARDIOGRAM COMPLETE
AR max vel: 3.27 cm2
AV Area VTI: 4.1 cm2
AV Area mean vel: 3.38 cm2
AV Mean grad: 2 mmHg
AV Peak grad: 3.5 mmHg
Ao pk vel: 0.93 m/s
Area-P 1/2: 4.46 cm2
Height: 68 in
MV VTI: 3.37 cm2
S' Lateral: 2.8 cm
Weight: 1840 [oz_av]

## 2024-03-18 LAB — TROPONIN I (HIGH SENSITIVITY)
Troponin I (High Sensitivity): 5 ng/L (ref <18)
Troponin I (High Sensitivity): 4 ng/L (ref <18)

## 2024-03-18 LAB — BRAIN NATRIURETIC PEPTIDE: B Natriuretic Peptide: 121.6 pg/mL — ABNORMAL HIGH (ref 0.0–100.0)

## 2024-03-18 LAB — HCG, QUANTITATIVE, PREGNANCY: hCG, Beta Chain, Quant, S: 2 m[IU]/mL (ref <5)

## 2024-03-18 MED ORDER — ACETAMINOPHEN 500 MG PO TABS: 1000.0000 mg | ORAL_TABLET | Freq: Four times a day (QID) | ORAL | 2 refills | Status: AC | PRN

## 2024-03-18 MED ORDER — ACETAMINOPHEN 500 MG PO TABS
1000.0000 mg | ORAL_TABLET | Freq: Once | ORAL | Status: AC
  Administered 2024-03-18: 1000 mg via ORAL
  Filled 2024-03-18: qty 2

## 2024-03-18 MED ORDER — OXYCODONE HCL 5 MG PO TABS
5.0000 mg | ORAL_TABLET | Freq: Once | ORAL | Status: AC
  Administered 2024-03-18: 5 mg via ORAL
  Filled 2024-03-18: qty 1

## 2024-03-18 MED ORDER — PREDNISONE 10 MG PO TABS: 10.0000 mg | ORAL_TABLET | Freq: Every day | ORAL | 0 refills | Status: AC

## 2024-03-18 NOTE — ED Provider Notes (Signed)
 [facility] Provider Note    Event Date/Time   First MD Initiated Contact with Patient 03/18/24 0845     (approximate)   History   Chest Pain  Pt to ED to chest pain that has been ongoing for a few days. Pain is mid chest and felt in back, rated at 9/10, and described as pressure.   HPI Melinda Steele is a 23 y.o. female complex past medical history including SLE with CHF (EF 30-35% by echo 07/2021), pericardial effusion, pericarditis, ESRD on HD presents for evaluation of chest pain -Present for 4-5 days.  Some shortness of breath/orthopnea.  Primarily describes as a substernal pressure. - No leg swelling, no history of DVT/PE, no recent surgery/stasis/travel - Last dialysis Saturday, has not missed any sessions, next dialysis planned for tomorrow - No preceding trauma - No cough, fever, shortness of breath at rest       Physical Exam   Triage Vital Signs: ED Triage Vitals  Encounter Vitals Group     BP 03/18/24 0830 (!) 140/113     Girls Systolic BP Percentile --      Girls Diastolic BP Percentile --      Boys Systolic BP Percentile --      Boys Diastolic BP Percentile --      Pulse Rate 03/18/24 0830 (!) 107     Resp 03/18/24 0830 18     Temp 03/18/24 0830 98.3 F (36.8 C)     Temp Source 03/18/24 0830 Oral     SpO2 03/18/24 0830 100 %     Weight 03/18/24 0832 115 lb (52.2 kg)     Height 03/18/24 0832 5' 8 (1.727 m)     Head Circumference --      Peak Flow --      Pain Score 03/18/24 0830 9     Pain Loc --      Pain Education --      Exclude from Growth Chart --     Most recent vital signs: Vitals:   03/18/24 0830 03/18/24 1232  BP: (!) 140/113 135/88  Pulse: (!) 107 98  Resp: 18 18  Temp: 98.3 F (36.8 C) 98.1 F (36.7 C)  SpO2: 100% 100%     General: Awake, no distress.  CV:  Good peripheral perfusion.  Mild tachycardia, regular rhythm, RP 2+ Resp:  Normal effort. CTAB Abd:  No distention. Nontender to deep palpation  throughout     ED Results / Procedures / Treatments   Labs (all labs ordered are listed, but only abnormal results are displayed) Labs Reviewed  BASIC METABOLIC PANEL WITH GFR - Abnormal; Notable for the following components:      Result Value   Sodium 134 (*)    Chloride 92 (*)    BUN 48 (*)    Creatinine, Ser 11.30 (*)    Calcium  8.7 (*)    GFR, Estimated 4 (*)    Anion gap 17 (*)    All other components within normal limits  CBC - Abnormal; Notable for the following components:   RDW 17.1 (*)    Platelets 88 (*)    All other components within normal limits  BRAIN NATRIURETIC PEPTIDE - Abnormal; Notable for the following components:   B Natriuretic Peptide 121.6 (*)    All other components within normal limits  HCG, QUANTITATIVE, PREGNANCY  TROPONIN I (HIGH SENSITIVITY)  TROPONIN I (HIGH SENSITIVITY)     EKG  See ED course below  RADIOLOGY Radiology interpreted by myself and radiology report reviewed.  Chest x-ray unremarkable.  Echo with no pericardial effusion.    PROCEDURES:  Critical Care performed: No  Procedures   MEDICATIONS ORDERED IN ED: Medications  oxyCODONE  (Oxy IR/ROXICODONE ) immediate release tablet 5 mg (5 mg Oral Given 03/18/24 0934)  acetaminophen  (TYLENOL ) tablet 1,000 mg (1,000 mg Oral Given 03/18/24 0934)     IMPRESSION / MDM / ASSESSMENT AND PLAN / ED COURSE  I reviewed the triage vital signs and the nursing notes.                              DDX/MDM/AP: Differential diagnosis includes, but is not limited to, MSK strain, GERD, pericarditis, ACS, pneumothorax.  Clinically doubt pulmonary embolism given no pleuritic pain and patient appears to be tachycardic at baseline per chart review.  Does not immediately appear to be in CHF exacerbation/hypervolemia.  Patient states she does sometimes have chest pain at home as well as body aches related to her lupus and will often take oxycodone  for this, has not taken anything  today.  Plan: -Labs - EKG - Chest x-ray - Oxycodone , Tylenol   Patient's presentation is most consistent with acute presentation with potential threat to life or bodily function.   ED course below.  Workup notable for worsening thrombocytopenia, fortunately serial troponins negative.  Echo notably improved with from prior with no ongoing pericardial effusion and improved EF.  Discussed with patient's rheumatologist at [facility] who recommended starting her on 10 mg daily prednisone  for her worsening thrombocytopenia and they will reach out to evaluate her in the outpatient setting shortly.  Wonder if patient may have mild pericarditis.  NSAIDs contraindicated in the setting of ESRD.  Patient feeling much better here, reassured by unremarkable workup.  Rx Tylenol , prednisone  and plan for outpatient follow-up.  ED return precautions in place.  Patient agrees with plan.  Clinical Course as of 03/18/24 1550  Mon Mar 18, 2024  0908 Bmp at baseline, K wnl [MM]  0908 Ecg = sinus tachycardia, rate 105, no gross ST elevation or depression, T wave inversions noted in leads III, aVF, V5, V6.  No clear evidence of ischemia nor arrhythmia.  Appears similar to prior EKG in 04/2023 [MM]  0941 CBC reviewed, does have new thrombocytopenia [MM]  0941 hCG negative [MM]  0954 Trop wnl [MM]  1013 CXR: IMPRESSION: No acute findings.   [MM]  1053 D/w Dr. Tobie of rheum - regarding pericardial effusion, rec consider echo or discuss with cardiology - regarding thrombocytopenia, rec d/w [facility] rheum  [MM]  1208 BNP notably improved from prior  Repeat troponin stable [MM]  1225 Echo:    1. Left ventricular ejection fraction, by estimation, is 50 to 55%. The  left ventricle has low normal function. The left ventricle has no regional  wall motion abnormalities. There is mild left ventricular hypertrophy.  Left ventricular diastolic  parameters were normal.   2. Right ventricular systolic function is normal. The  right ventricular  size is normal.   3. The mitral valve is normal in structure. Mild mitral valve  regurgitation. No evidence of mitral stenosis.   4. The aortic valve is normal in structure. Aortic valve regurgitation is  not visualized. No aortic stenosis is present.   5. The inferior vena cava is normal in size with greater than 50%  respiratory variability, suggesting right atrial pressure of 3 mmHg.    No pericardial  effusion visualized. [MM]  1304 ED secretary contacting rheum at [facility] [MM]  1341 D/w [facility] rheum Dr. Roylene - rec 10mg  prednisone  qday - they will contact patient to see them in the next 1-2 weeks - No indication for admission from their perspective given my reassuring evaluation [MM]    Clinical Course User Index [MM] Clarine Ozell LABOR, MD     FINAL CLINICAL IMPRESSION(S) / ED DIAGNOSES   Final diagnoses:  Chest pain, unspecified type  Thrombocytopenia (HCC)  Systemic lupus erythematosus, unspecified SLE type, unspecified organ involvement status (HCC)     Rx / DC Orders   ED Discharge Orders          Ordered    predniSONE  (DELTASONE ) 10 MG tablet  Daily        03/18/24 1347    acetaminophen  (TYLENOL ) 500 MG tablet  Every 6 hours PRN        03/18/24 1347             Note:  This document was prepared using Dragon voice recognition software and may include unintentional dictation errors.   Clarine Ozell LABOR, MD 03/18/24 1550

## 2024-03-18 NOTE — ED Triage Notes (Signed)
 Pt to ED to chest pain that has been ongoing for a few days. Pain is mid chest and felt in back, rated at 9/10, and described as pressure.

## 2024-03-18 NOTE — Discharge Instructions (Addendum)
 Your evaluation in the emergency department was overall reassuring.  Your platelet levels have dipped somewhat, and I discussed her case with her rheumatologist.  She did recommend starting you on a 2-week course of low-dose steroids which I have prescribed.  You can use Tylenol  as needed for any recurrent discomfort, and your rheumatologist will reach out to follow-up with you within the next 1-2 weeks.  Return to the emergency department with any new or worsening symptoms.

## 2024-03-18 NOTE — ED Notes (Signed)
 [facility] for consult with rheumatology   1314  took info and will call back

## 2024-03-18 NOTE — Progress Notes (Signed)
*  PRELIMINARY RESULTS* Echocardiogram 2D Echocardiogram has been performed.  Melinda Steele 03/18/2024, 12:06 PM

## 2024-03-18 NOTE — ED Notes (Signed)
 See triage note  Presents with chest discomfort /pressure for the past several days  Denies any fever or cough  Afebrile on arrival

## 2024-09-10 ENCOUNTER — Encounter: Payer: Self-pay | Admitting: Emergency Medicine

## 2024-09-10 ENCOUNTER — Emergency Department
Admission: EM | Admit: 2024-09-10 | Discharge: 2024-09-10 | Disposition: A | Discharge status: Home / Self Care | Attending: Emergency Medicine | Admitting: Emergency Medicine

## 2024-09-10 ENCOUNTER — Other Ambulatory Visit: Payer: Self-pay

## 2024-09-10 DIAGNOSIS — Z992 Dependence on renal dialysis: Secondary | ICD-10-CM | POA: Diagnosis not present

## 2024-09-10 DIAGNOSIS — R569 Unspecified convulsions: Secondary | ICD-10-CM | POA: Diagnosis present

## 2024-09-10 DIAGNOSIS — I120 Hypertensive chronic kidney disease with stage 5 chronic kidney disease or end stage renal disease: Secondary | ICD-10-CM | POA: Diagnosis not present

## 2024-09-10 DIAGNOSIS — N186 End stage renal disease: Secondary | ICD-10-CM | POA: Diagnosis not present

## 2024-09-10 LAB — COMPREHENSIVE METABOLIC PANEL WITH GFR
ALT: <5 U/L (ref 0–44)
AST: 16 U/L (ref 15–41)
Albumin: 4 g/dL (ref 3.5–5.0)
Alkaline Phosphatase: 183 U/L — ABNORMAL HIGH (ref 38–126)
Anion gap: 16 — ABNORMAL HIGH (ref 5–15)
BUN: 30 mg/dL — ABNORMAL HIGH (ref 6–20)
CO2: 27 mmol/L (ref 22–32)
Calcium: 9 mg/dL (ref 8.9–10.3)
Chloride: 95 mmol/L — ABNORMAL LOW (ref 98–111)
Creatinine, Ser: 6.68 mg/dL — ABNORMAL HIGH (ref 0.44–1.00)
GFR, Estimated: 8 mL/min — ABNORMAL LOW (ref >60)
Glucose, Bld: 79 mg/dL (ref 70–99)
Potassium: 4.1 mmol/L (ref 3.5–5.1)
Sodium: 138 mmol/L (ref 135–145)
Total Bilirubin: 0.4 mg/dL (ref 0.0–1.2)
Total Protein: 8.3 g/dL — ABNORMAL HIGH (ref 6.5–8.1)

## 2024-09-10 LAB — CBC
HCT: 36.4 % (ref 36.0–46.0)
Hemoglobin: 11 g/dL — ABNORMAL LOW (ref 12.0–15.0)
MCH: 24.9 pg — ABNORMAL LOW (ref 26.0–34.0)
MCHC: 30.2 g/dL (ref 30.0–36.0)
MCV: 82.4 fL (ref 80.0–100.0)
Platelets: 87 K/uL — ABNORMAL LOW (ref 150–400)
RBC: 4.42 MIL/uL (ref 3.87–5.11)
RDW: 17.4 % — ABNORMAL HIGH (ref 11.5–15.5)
WBC: 7.9 K/uL (ref 4.0–10.5)
nRBC: 0 % (ref 0.0–0.2)

## 2024-09-10 LAB — CBG MONITORING, ED: Glucose-Capillary: 78 mg/dL (ref 70–99)

## 2024-09-10 MED ORDER — OXYCODONE-ACETAMINOPHEN 5-325 MG PO TABS
1.0000 | ORAL_TABLET | Freq: Once | ORAL | Status: AC
  Administered 2024-09-10: 11:00:00 1 via ORAL
  Filled 2024-09-10: qty 1

## 2024-09-10 MED ORDER — LEVETIRACETAM (KEPPRA) 500 MG/5 ML ADULT IV PUSH
1000.0000 mg | Freq: Once | INTRAVENOUS | Status: AC
  Administered 2024-09-10: 10:00:00 1000 mg via INTRAVENOUS
  Filled 2024-09-10: qty 10

## 2024-09-10 MED ORDER — OXYCODONE-ACETAMINOPHEN 5-325 MG PO TABS
1.0000 | ORAL_TABLET | Freq: Once | ORAL | Status: AC
  Administered 2024-09-10: 10:00:00 1 via ORAL
  Filled 2024-09-10: qty 1

## 2024-09-10 MED ORDER — LACOSAMIDE 50 MG PO TABS
200.0000 mg | ORAL_TABLET | Freq: Once | ORAL | Status: AC
  Administered 2024-09-10: 10:00:00 200 mg via ORAL
  Filled 2024-09-10: qty 4

## 2024-09-10 NOTE — ED Triage Notes (Addendum)
 Pt to ED via [facility] from [facility]. Pt had approximately 1.8 liters of 3 liters taken off per EMS. Pt had Grand-mal seizure that lasted approximately 1.5 minutes per dialysis staff. Pt was post ictal on EMS arrival. Pt is currently A & O x4. Pt states that she has not taken her seizure medication today, otherwise has not missed any doses. Pt states that her last seizure was 1 year ago. Pt is c/o pain in her chest currently. Pt is in NAD.   Pt had fistula placed in her right arm yesterday.

## 2024-09-10 NOTE — ED Provider Notes (Signed)
 [facility] Provider Note    Event Date/Time   First MD Initiated Contact with Patient 09/10/24 [phone]     (approximate)  History   Chief Complaint: Seizures  HPI  Melinda Steele is a 23 y.o. female with a past medical history of hypertension, lupus, end-stage renal disease on hemodialysis Tuesday/Thursday/Saturday, seizure disorder, presents to the emergency department after reported seizure.  According to the patient she was at her dialysis appointment and had almost finished her dialysis session when she apparently had a seizure.  Patient states she woke up in the back of the ambulance.  Patient states it has been approximately 1 year since she has had a seizure.  Patient takes Vimpat  and Keppra  for her seizure disorder had not taken her morning medications yet as she usually takes them after dialysis.  Patient is complaining of some generalized body pains from the seizure.  No shortness of breath.  No recent illnesses.  No chest pain.  Physical Exam   Triage Vital Signs: ED Triage Vitals  Encounter Vitals Group     BP 09/10/24 0908 (!) 143/123     Girls Systolic BP Percentile --      Girls Diastolic BP Percentile --      Boys Systolic BP Percentile --      Boys Diastolic BP Percentile --      Pulse Rate 09/10/24 0908 (!) 118     Resp 09/10/24 0908 16     Temp 09/10/24 0910 98.3 F (36.8 C)     Temp Source 09/10/24 0910 Oral     SpO2 09/10/24 0908 100 %     Weight 09/10/24 0905 131 lb 14.4 oz (59.8 kg)     Height 09/10/24 0905 5' 8 (1.727 m)     Head Circumference --      Peak Flow --      Pain Score 09/10/24 0904 5     Pain Loc --      Pain Education --      Exclude from Growth Chart --     Most recent vital signs: Vitals:   09/10/24 0908 09/10/24 0910  BP: (!) 143/123   Pulse: (!) 118   Resp: 16   Temp:  98.3 F (36.8 C)  SpO2: 100%     General: Awake, no distress.  CV:  Good peripheral perfusion.  Regular rate and rhythm   Resp:  Normal effort.  Equal breath sounds bilaterally.  Abd:  No distention.  Soft, nontender.  No rebound or guarding.  ED Results / Procedures / Treatments   MEDICATIONS ORDERED IN ED: Medications  levETIRAcetam  (KEPPRA ) undiluted injection 1,000 mg (has no administration in time range)  lacosamide  (VIMPAT ) tablet 200 mg (has no administration in time range)  oxyCODONE -acetaminophen  (PERCOCET/ROXICET) 5-325 MG per tablet 1 tablet (has no administration in time range)     IMPRESSION / MDM / ASSESSMENT AND PLAN / ED COURSE  I reviewed the triage vital signs and the nursing notes.  Patient's presentation is most consistent with acute presentation with potential threat to life or bodily function.  Patient presents emergency department after a likely seizure at her dialysis session this morning.  Patient received nearly her full session prior to the seizure.  Patient states has been 1 to 1.5 years since her last seizure.  Takes Vimpat  and Keppra  but did not take them yet this morning.  We will dose patient's 200 mg oral Vimpat .  I have ordered 1000 mg IV load  of Keppra .  Will check labs and continue to closely monitor.  Given the patient's diffuse body pain she was requesting pain medication we will give 1 Percocet while awaiting further results.  Patient agreeable to plan of care.  Currently she appears well she is somewhat somnolent likely mildly postictal but is answering questions appropriately.  Mom is here with the patient as well.  Patient states he feels much better.  No longer has a headache.  Patient's lab work today shows a reassuring CBC, reassuring chemistry with a normal potassium.  Patient has been loaded with IV Keppra , and given her normal a.m. oral Vimpat .  Patient will take her evening medications as prescribed.  Discussed with patient and mom to call their neurologist to inform them of today's seizure.  However given her otherwise reassuring workup I believe the patient will  be safe for discharge home with outpatient follow-up.  FINAL CLINICAL IMPRESSION(S) / ED DIAGNOSES   Seizure  Note:  This document was prepared using Dragon voice recognition software and may include unintentional dictation errors.   Dorothyann Drivers, MD 09/10/24 1116

## 2024-09-10 NOTE — ED Notes (Signed)
 Pt's dressing replaced

## 2024-09-10 NOTE — Discharge Instructions (Addendum)
 Please follow-up with your neurologist regarding today's ER visit and your likely seizure.  Please take your evening Vimpat /Keppra  medications as prescribed by your physician.  Return to the emergency department for any further seizure-like activity, or any other symptom personally concerning to yourself.

## 2024-09-17 NOTE — Unmapped External Note (Signed)
 Patient chart reviewed by [facility] Pre-Procedure Services at [facility].  Per Adult Anesthesia Algorithm, patient meets criteria for No Visit Needed as evidenced by no significant medical history affecting anesthesia noted in Epic or Care Everywhere.   Similar procedure 09/09/2024, no history of anesthetic complications during that encounter.   Estimated body mass index is 19.48 kg/m as calculated from the following:   Height as of 09/03/24: 172.7 cm (5' 7.99).   Weight as of 09/09/24: 58.1 kg (128 lb 1.6 oz).  Procedure date: 11/11/2024  Posted Procedure Location:  [facility]  Provider for procedure: Marchelle Kitchens, MD   Appointment information: appointment not needed

## 2024-09-21 ENCOUNTER — Emergency Department
Admission: EM | Admit: 2024-09-21 | Discharge: 2024-09-21 | Disposition: A | Discharge status: Home / Self Care | Attending: Emergency Medicine | Admitting: Emergency Medicine

## 2024-09-21 ENCOUNTER — Other Ambulatory Visit: Payer: Self-pay

## 2024-09-21 DIAGNOSIS — Y738 Miscellaneous gastroenterology and urology devices associated with adverse incidents, not elsewhere classified: Secondary | ICD-10-CM | POA: Insufficient documentation

## 2024-09-21 DIAGNOSIS — T829XXA Unspecified complication of cardiac and vascular prosthetic device, implant and graft, initial encounter: Secondary | ICD-10-CM | POA: Diagnosis present

## 2024-09-21 DIAGNOSIS — E875 Hyperkalemia: Secondary | ICD-10-CM | POA: Diagnosis not present

## 2024-09-21 LAB — CBC
HCT: 38.3 % (ref 36.0–46.0)
Hemoglobin: 11 g/dL — ABNORMAL LOW (ref 12.0–15.0)
MCH: 24.4 pg — ABNORMAL LOW (ref 26.0–34.0)
MCHC: 28.7 g/dL — ABNORMAL LOW (ref 30.0–36.0)
MCV: 84.9 fL (ref 80.0–100.0)
Platelets: 218 K/uL (ref 150–400)
RBC: 4.51 MIL/uL (ref 3.87–5.11)
RDW: 19.3 % — ABNORMAL HIGH (ref 11.5–15.5)
WBC: 6.2 K/uL (ref 4.0–10.5)
nRBC: 0 % (ref 0.0–0.2)

## 2024-09-21 LAB — PHOSPHORUS: Phosphorus: 6.2 mg/dL — ABNORMAL HIGH (ref 2.5–4.6)

## 2024-09-21 LAB — BASIC METABOLIC PANEL WITH GFR
Anion gap: 20 — ABNORMAL HIGH (ref 5–15)
BUN: 85 mg/dL — ABNORMAL HIGH (ref 6–20)
CO2: 22 mmol/L (ref 22–32)
Calcium: 7.8 mg/dL — ABNORMAL LOW (ref 8.9–10.3)
Chloride: 93 mmol/L — ABNORMAL LOW (ref 98–111)
Creatinine, Ser: 13.4 mg/dL — ABNORMAL HIGH (ref 0.44–1.00)
GFR, Estimated: 4 mL/min — ABNORMAL LOW
Glucose, Bld: 77 mg/dL (ref 70–99)
Potassium: 6.2 mmol/L — ABNORMAL HIGH (ref 3.5–5.1)
Sodium: 135 mmol/L (ref 135–145)

## 2024-09-21 MED ORDER — PENTAFLUOROPROP-TETRAFLUOROETH EX AERO: 1.0000 | INHALATION_SPRAY | CUTANEOUS | Status: DC | PRN

## 2024-09-21 MED ORDER — HEPARIN SODIUM (PORCINE) 1000 UNIT/ML IJ SOLN
INTRAMUSCULAR | Status: AC
  Filled 2024-09-21: qty 4

## 2024-09-21 MED ORDER — ALTEPLASE 2 MG IJ SOLR: 2.0000 mg | Freq: Once | INTRAMUSCULAR | Status: DC | PRN

## 2024-09-21 MED ORDER — NEPRO/CARBSTEADY PO LIQD: 237.0000 mL | ORAL | Status: DC | PRN

## 2024-09-21 MED ORDER — CLONIDINE HCL 0.1 MG PO TABS
0.1000 mg | ORAL_TABLET | Freq: Once | ORAL | Status: AC
  Administered 2024-09-21: 0.1 mg via ORAL

## 2024-09-21 MED ORDER — CLONIDINE HCL 0.1 MG PO TABS
ORAL_TABLET | ORAL | Status: AC
  Filled 2024-09-21: qty 1

## 2024-09-21 MED ORDER — LIDOCAINE HCL (PF) 1 % IJ SOLN
5.0000 mL | INTRAMUSCULAR | Status: DC | PRN
  Filled 2024-09-21: qty 5

## 2024-09-21 MED ORDER — HEPARIN SODIUM (PORCINE) 1000 UNIT/ML IJ SOLN
INTRAMUSCULAR | Status: AC
  Filled 2024-09-21: qty 5

## 2024-09-21 MED ORDER — ANTICOAGULANT SODIUM CITRATE 4% (200MG/5ML) IV SOLN: 5.0000 mL | Status: DC | PRN

## 2024-09-21 MED ORDER — LIDOCAINE-PRILOCAINE 2.5-2.5 % EX CREA: 1.0000 | TOPICAL_CREAM | CUTANEOUS | Status: DC | PRN

## 2024-09-21 MED ORDER — HEPARIN SODIUM (PORCINE) 1000 UNIT/ML DIALYSIS: 1000.0000 [IU] | INTRAMUSCULAR | Status: DC | PRN

## 2024-09-21 MED ORDER — CHLORHEXIDINE GLUCONATE CLOTH 2 % EX PADS
6.0000 | MEDICATED_PAD | Freq: Every day | CUTANEOUS | Status: DC
  Filled 2024-09-21: qty 6

## 2024-09-21 NOTE — Progress Notes (Signed)
 " [facility]  ROUNDING NOTE   Subjective:  Patient seen and evaluated on dialysis. Access working well. Elevated BP, clonidine  given.  Hemodialysis dialysis treatment flowsheet  Blood flow rate (mL/min): 350 Arterial pressures (mmHg):-142.62 Venous pressures (mmHg): 163.02 TMP (mmHg): 9.7 Ultrafiltration rate (mL/min): 767 Dialysate (mL/min): 300   Objective:  Vital signs in last 24 hours:  Temp:  [97.9 F (36.6 C)-98.6 F (37 C)] 98.6 F (37 C) (12/27 1002) Pulse Rate:  [73-99] 99 (12/27 1200) Resp:  [10-18] 13 (12/27 1100) BP: (144-163)/(112-119) 153/116 (12/27 1224) SpO2:  [99 %-100 %] 100 % (12/27 1200) Weight:  [54.5 kg-59.4 kg] 54.5 kg (12/27 1002)  Weight change:  Filed Weights   09/21/24 0619 09/21/24 1002  Weight: 59.4 kg 54.5 kg    Intake/Output: No intake/output data recorded.   Intake/Output this shift:  No intake/output data recorded.  Physical Exam: General: NAD,   Head: Normocephalic  Eyes: Anicteric, PERRL  Neck: Supple  Lungs:  Clear  Heart: Regular rate  Abdomen:  Soft  Extremities:  No peripheral edema.  Neurologic: Alert, awake, conversant  Skin: No lesions  Access: Rt chest permcath    Basic Metabolic Panel: Recent Labs  Lab 09/21/24 0732  NA 135  K 6.2*  CL 93*  CO2 22  GLUCOSE 77  BUN 85*  CREATININE 13.40*  CALCIUM  7.8*    Liver Function Tests: No results for input(s): AST, ALT, ALKPHOS, BILITOT, PROT, ALBUMIN in the last 168 hours. No results for input(s): LIPASE, AMYLASE in the last 168 hours. No results for input(s): AMMONIA in the last 168 hours.  CBC: Recent Labs  Lab 09/21/24 0732  WBC 6.2  HGB 11.0*  HCT 38.3  MCV 84.9  PLT 218    Cardiac Enzymes: No results for input(s): CKTOTAL, CKMB, CKMBINDEX, TROPONINI in the last 168 hours.  BNP: Invalid input(s): POCBNP  CBG: No results for input(s): GLUCAP in the last 168 hours.  Microbiology: Results for  orders placed or performed during the hospital encounter of 02/04/23  Peritoneal fluid culture w Gram Stain     Status: None   Collection Time: 02/04/23 10:19 AM   Specimen: Peritoneal Washings; Peritoneal Fluid  Result Value Ref Range Status   Specimen Description   Final    PERITONEAL Performed at [facility], [address] [place], [place] [address]    Special Requests   Final    NONE Performed at [facility], [address] [place], [place] [address]    Gram Stain   Final    RARE WBC PRESENT, PREDOMINANTLY MONONUCLEAR RARE GRAM NEGATIVE RODS Performed at [facility], [address], [place], [place] [address]    Culture MODERATE CITROBACTER BRAAKII  Final   Report Status 02/07/2023 FINAL  Final   Organism ID, Bacteria CITROBACTER BRAAKII  Final      Susceptibility   Citrobacter braakii - MIC*    CEFEPIME  <=0.12 SENSITIVE Sensitive     CEFTAZIDIME <=1 SENSITIVE Sensitive     CEFTRIAXONE  <=0.25 SENSITIVE Sensitive     CIPROFLOXACIN <=0.25 SENSITIVE Sensitive     GENTAMICIN  <=1 SENSITIVE Sensitive     IMIPENEM 2 SENSITIVE Sensitive     TRIMETH/SULFA <=20 SENSITIVE Sensitive     PIP/TAZO <=4 SENSITIVE Sensitive     * MODERATE CITROBACTER BRAAKII  Culture, blood (Routine X 2) w Reflex to ID Panel     Status: None   Collection Time: 02/04/23  9:13 PM   Specimen: BLOOD  Result Value Ref Range Status   Specimen Description BLOOD BLOOD RIGHT ARM  Final   Special Requests   Final    BOTTLES DRAWN AEROBIC ONLY Blood Culture adequate volume   Culture   Final    NO GROWTH 5 DAYS Performed at [facility], [address] [place], [place] [address]    Report Status 02/09/2023 FINAL  Final  Culture, blood (Routine X 2) w Reflex to ID Panel     Status: None   Collection Time: 02/04/23  9:23 PM   Specimen: BLOOD  Result Value Ref Range Status   Specimen Description BLOOD BLOOD LEFT ARM  Final   Special Requests   Final    BOTTLES DRAWN  AEROBIC AND ANAEROBIC Blood Culture adequate volume   Culture   Final    NO GROWTH 5 DAYS Performed at [facility], [address] [place], [place] [address]    Report Status 02/09/2023 FINAL  Final    Coagulation Studies: No results for input(s): LABPROT, INR in the last 72 hours.  Urinalysis: No results for input(s): COLORURINE, LABSPEC, PHURINE, GLUCOSEU, HGBUR, BILIRUBINUR, KETONESUR, PROTEINUR, UROBILINOGEN, NITRITE, LEUKOCYTESUR in the last 72 hours.  Invalid input(s): APPERANCEUR    Imaging: No results found.   Medications:     Chlorhexidine  Gluconate Cloth  6 each Topical Q0600     Assessment/ Plan:  Melinda Steele is a 23 y.o.  female with hypertension, lupus, history of pericardial tamponade, congestive heart failure, seizures and end-stage renal disease on hemodialysis (failed peritoneal dialysis).  Outpatient dialysis at [facility] [address]/TTS, followed by [facility].  ESRD with hyperkalemia  Patient came to ED for CVC access issues. Was flushed and given activase  in outpatient. Receiving urgent dialysis for elevated potassium 6.2. Will be corrected with dialysis. Access working well now.    Anemia with chronic kidney disease Hgb 11, no need for ESA at this time   Secondary Hyperparathyroidism: with hypocalcemia Ca 7.8 on calcium  acetate outpatient.   Hypertension with chronic kidney disease Patient reports only taking losartan 50mg  BID. BP 153/116 Clonidine  given with dialysis.    LOS: 0 Eunie Lawn SHAUNNA Dines 12/27/202512:25 PM  "

## 2024-09-21 NOTE — ED Notes (Signed)
 Patient ready to leave, does not want to wait for discharge instructions.  AAOx3. Skin warm and dry. NAD

## 2024-09-21 NOTE — ED Triage Notes (Signed)
 FIRST NURSE NOTE:  Pt states she is here to have her dialysis catheter exchanged pt states she needs it exchanged so she can have dialysis.

## 2024-09-21 NOTE — ED Triage Notes (Signed)
 Pt presents for concern of dialysis catheter issues. Had a full treatment Wednesday without issues but concerned it is not pulling enough. No redness noted to the area. Placed on right side of chest in Sept

## 2024-09-21 NOTE — Progress Notes (Signed)
" °   09/21/24 1326  Vitals  Temp 98.2 F (36.8 C)  Temp Source Oral  BP (!) 136/102  MAP (mmHg) 111  BP Location Right Arm  BP Method Automatic  Patient Position (if appropriate) Lying  Pulse Rate (!) 101  Pulse Rate Source Monitor  ECG Heart Rate (!) 106  Resp 15  Weight 52.9 kg  Type of Weight Post-Dialysis  Oxygen Therapy  SpO2 99 %  O2 Device Room Air  Patient Activity (if Appropriate) In bed  Pulse Oximetry Type Continuous  During Treatment Monitoring  Blood Flow Rate (mL/min) 349 mL/min  Arterial Pressure (mmHg) -149.89 mmHg  Venous Pressure (mmHg) 164.23 mmHg  TMP (mmHg) 10.7 mmHg  Ultrafiltration Rate (mL/min) 683 mL/min  Dialysate Flow Rate (mL/min) 299 ml/min  Dialysate Potassium Concentration 2  Dialysate Calcium  Concentration 2.5  Duration of HD Treatment -hour(s) 3 hour(s)  Cumulative Fluid Removed (mL) per Treatment  1500.11  HD Safety Checks Performed Yes  Intra-Hemodialysis Comments Tx initiated;Tx completed  Post Treatment  Dialyzer Clearance Lightly streaked  Liters Processed 63.1  Fluid Removed (mL) 1500 mL  Tolerated HD Treatment Yes  Post-Hemodialysis Comments Goal met. Tx has been tolerated. VS improved. Patient scheduled to be discharged from the ED  Hemodialysis Catheter Right Subclavian  No placement date or time found.   Orientation: Right  Access Location: Subclavian  Site Condition No complications  Blue Lumen Status Heparin  locked;Antimicrobial dead end cap  Red Lumen Status Heparin  locked;Antimicrobial dead end cap  Purple Lumen Status N/A  Catheter fill solution Heparin  1000 units/ml  Catheter fill volume (Arterial) 1.6 cc  Catheter fill volume (Venous) 1.6  Dressing Type Transparent  Dressing Status Clean, Dry, Intact  Drainage Description None  Post treatment catheter status Capped and Clamped    "

## 2024-09-21 NOTE — ED Notes (Signed)
 PT TO DIALYSIS @ THIS TIME

## 2024-09-21 NOTE — ED Provider Notes (Signed)
 "  [facility] Provider Note    Event Date/Time   First MD Initiated Contact with Patient 09/21/24 [phone]     (approximate)   History   Vascular Access Problem   HPI  Melinda Steele is a 23 y.o. female with a history of lupus on dialysis, right sided chest wall catheter.  Had dialysis on Wednesday (typically Tuesday Thursday Saturday but because of Christmas holidays was done early) and had typical session but reports of decreased flow during dialysis.  They attempted declotting and were able to complete the session but told her that she would likely need catheter placement.  She feels well and has no complaints     Physical Exam   Triage Vital Signs: ED Triage Vitals  Encounter Vitals Group     BP 09/21/24 0619 (!) 160/112     Girls Systolic BP Percentile --      Girls Diastolic BP Percentile --      Boys Systolic BP Percentile --      Boys Diastolic BP Percentile --      Pulse Rate 09/21/24 0619 77     Resp 09/21/24 0619 18     Temp 09/21/24 0619 97.9 F (36.6 C)     Temp Source 09/21/24 0619 Oral     SpO2 09/21/24 0619 100 %     Weight 09/21/24 0619 59.4 kg (131 lb)     Height 09/21/24 0619 1.727 m (5' 8)     Head Circumference --      Peak Flow --      Pain Score 09/21/24 0625 0     Pain Loc --      Pain Education --      Exclude from Growth Chart --     Most recent vital signs: Vitals:   09/21/24 0619 09/21/24 0657  BP: (!) 160/112   Pulse: 77   Resp: 18   Temp: 97.9 F (36.6 C)   SpO2: 100% 100%     General: Awake, no distress.  CV:  Good peripheral perfusion.  Right-sided chest wall catheter, intact Resp:  Normal effort.  Abd:  No distention.  Other:     ED Results / Procedures / Treatments   Labs (all labs ordered are listed, but only abnormal results are displayed) Labs Reviewed  CBC - Abnormal; Notable for the following components:      Result Value   Hemoglobin 11.0 (*)    MCH 24.4 (*)    MCHC 28.7 (*)    RDW  19.3 (*)    All other components within normal limits  BASIC METABOLIC PANEL WITH GFR - Abnormal; Notable for the following components:   Potassium 6.2 (*)    Chloride 93 (*)    BUN 85 (*)    Creatinine, Ser 13.40 (*)    Calcium  7.8 (*)    GFR, Estimated 4 (*)    Anion gap 20 (*)    All other components within normal limits  HEPATITIS B SURFACE ANTIGEN  HEPATITIS B SURFACE ANTIBODY, QUANTITATIVE     EKG     RADIOLOGY     PROCEDURES:  Critical Care performed:   Procedures   MEDICATIONS ORDERED IN ED: Medications  Chlorhexidine  Gluconate Cloth 2 % PADS 6 each (has no administration in time range)     IMPRESSION / MDM / ASSESSMENT AND PLAN / ED COURSE  I reviewed the triage vital signs and the nursing notes. Patient's presentation is most consistent with exacerbation of  chronic illness.  Patient presents with dialysis catheter issues as described above, will check potassium  Discussed with Dr. Douglas of nephrology, he will send dialysis nurse down to test catheter to determine whether emergent replacement is necessary   Nurse reports good flow no need for replacement at this time however potassium is mildly elevated at 6.2, Dr. Douglas will arrange for dialysis and then patient will be appropriate for discharge and will follow-up closely with her vascular team      FINAL CLINICAL IMPRESSION(S) / ED DIAGNOSES   Final diagnoses:  Complication of vascular dialysis catheter, unspecified complication, initial encounter  Hyperkalemia     Rx / DC Orders   ED Discharge Orders     None        Note:  This document was prepared using Dragon voice recognition software and may include unintentional dictation errors.   Arlander Charleston, MD 09/21/24 [phone]  "

## 2024-10-09 NOTE — Progress Notes (Signed)
 Dressing clean, dry, and intact.  Discharge instructions reviewed and given to pt and family.  Understanding verbalized.  Pt ambulatory without assistance.  Discharged from PRU in stable condition.

## 2024-10-09 NOTE — Unmapped External Note (Signed)
 [facility] - Operative Note   VIR Post-Procedure Note  Procedure Name: Tunneled HD catheter exchange with fibrin sheath disruption and venoplasty  Pre-Op Diagnosis: Malfunctioning indwelling perm HD catheter  Post-Op Diagnosis: Same as pre-operative diagnosis  VIR Providers  Attending: Jed Cap, MD Fellow: Lela Slates, MD  Time out: Prior to the procedure, a time out was performed with all team members present. During the time out, the patient, procedure and procedure site when applicable were verbally verified by the team members, Dr. Lela Slates and Dr. Jed Cap   Description of procedure:  -- Indwelling RIJV tunneled HD catheter flushed and aspirated with slower flows. Pull back venography demonstrated long segment fibrin sheath extending from the right brachiocephalic vein to SVC. -- Fibrin sheath disruption and venoplasty of the right brachiocephalic vein to SVC was performed using a 10 mm conquest balloon. Post-interventional venography demonstrated improved luminal diameter and flow through the right BCV and SVC. -- Successful exchange for a new 14.14F x 19 cm HD catheter using fluoroscopic guidance. New catheter flushed and aspirated appropriately at conclusion of case. Heparin  locked with 1000 units of heparin .   Sedation: Moderate  Estimated Blood Loss: approximately <5 mL Complications: None  See detailed procedure note with images in PACS [facility]).  The patient tolerated the procedure well without incident or complication and left the room in stable condition.  Plan: - Line ready for use  Lela Slates, MD 10/09/2024 9:36 AM

## 2024-10-10 NOTE — Telephone Encounter (Signed)
 Reason for call: pt mom called in reference to pt FMLA paperork that was faxed on Sat. and attached to pt chart, pt would like to pick up paperwork today while she is in [facility], thanks    Last ov: 09/04/2024 Next ov: 12/16/2024

## 2024-10-10 NOTE — Progress Notes (Signed)
 "  DIVISION OF CARDIOLOGY [facility] , Melinda Steele       Date of Service: 10/10/2024  Return Patient Clinic Note  PCP: Referring Provider:  Leanne Ileana BIRCH, MD [address] [place] [address] Phone: [phone] Fax: [phone] Melinda Augustin CROME, MD [address] [place],  [place] [address] Phone: [phone] Fax: [phone]   Assessment and Plan:    Melinda Steele is a 24 y.o. female with a history of HFrecoveredEF, SLE w/ class IV lupus nephritis, hypertension, chronic pericardial/pleural effusions (now resolved), ESRD on HD, and hemolytic anemia who presents to cardiology clinic for routine follow-up.  HF with recovered ejection fraction - Etiology presumably nonischemic and most likely related to underlying SLE. TTE from 01/2021 showed dilated cardiomyopathy with EF 20%, moderate, chronic circumferential pericardial effusion, moderate MR, and mild AI.  Subsequent TTE from 10/2021 showed EF 35-40%.  Most recent echos, including 12/2022, have demonstrated recovery of LVEF to 50-55% with no evidence of pericardial effusion. Denies any symptoms suggestive of volume overload. Repeat echo completed at the outside hospital with EF remaining at 50-55% (03/18/24).   Current assessment She is continue to be without heart failure symptoms. Despite a fairly eventful hospitalization-left ventricular systolic function remain normal.  Historically GDMT has been limited by renal dysfunction and hypotension during dialysis sessions.  Will continue to monitor likely with yearly echo. 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  Underwent successful clot removal.  She was  also seen by hematology while inpatient given there was a bleeding event while she was hospitalized (ovarian artery with hemoperitoneum which self resolved).  They have recommended that she be continued on apixaban 2.5 mg twice daily.  Chronic pericardial effusion, resolved - Initially noted to have moderate, circumferential pericardial effusion on echo form 01/2021. Has well documented history of pericardial/pleural effusion from several previous echos. Most likely secondary to underlying SLE.  No significant effusion has been demonstrated on her most recent surface echo.   Return to clinic: 6 months   The patient was discussed with Dr. Gil.   Subjective:     Reason for Visit: Heart failure management   History of Present Illness: Melinda Steele is a 24 y.o. female with a history of HFrEF (20%), SLE w/ class IV lupus nephritis, hypertension, chronic pericardial/pleural effusions, CKD, and hemolytic anemia who presents to cardiology clinic for routine follow-up.    Background Historically, when she was initially diagnosed with heart failure - had extreme fatigue, chest pain, dyspnea, and could not lay flat. This seemed to significantly improve with medical management of her heart failure. Most recent echo demonstrated a normal LVEF, no effusions. Reports that she is able to walk, exercise, lift weights without difficulty.  Interval History (Today) Unfortunately since her last visit-she had a long hospitalization-mostly for an infection where she was noted to have MSSA bacteremia thought to be secondary to a hemodialysis catheter.  Her hospital course was complicated by thrombus within the right atrium for which she underwent angio vac for thrombus removal.  Since that time-her hemodialysis catheter had been removed and subsequently replaced after clearing her blood cultures.  She also underwent a right arm AV fistula procedure thereafter for more definitive  access for her hemodialysis.  During his  hospitalization-most notably, her left ventricular systolic function remained normal.  Cardiovascular History: Likely nonischemic cardiomyopathy (previously 20%, now recovered) Hypertension Chronic pericardial/pleural effusion  Echo: TTE 06/05/2024  1. There is a large, partially mobile 3.1 x 2.3 cm mass adherent to the basal interatrial septum near the tricuspid annulus and extending free wall of the of the right atrium. This appears most consistent with thrombus but cannot exclude vegetation.   2. The left ventricle is normal in size with normal wall thickness.   3. The left ventricular systolic function is normal, LVEF is visually estimated at > 55%.   4. There is mild mitral valve regurgitation.   5. The right ventricle is normal in size, with normal systolic function.   6. The right atrium is mildly dilated in size.  TTE 03/18/2024  1. Left ventricular ejection fraction, by estimation, is 50 to 55%. The left ventricle has low normal function. The left ventricle has no regional wall motion abnormalities. There is mild left ventricular hypertrophy. Left ventricular diastolic  parameters were normal.   2. Right ventricular systolic function is normal. The right ventricular size is normal.   3. The mitral valve is normal in structure. Mild mitral valve regurgitation. No evidence of mitral stenosis.   4. The aortic valve is normal in structure. Aortic valve regurgitation is not visualized. No aortic stenosis is present.   5. The inferior vena cava is normal in size with greater than 50% respiratory variability, suggesting right atrial pressure of 3 mmHg.    TTE 12/26/2022   1. The left ventricle is normal in size with mildly increased wall thickness.   2. The left ventricular systolic function is borderline, LVEF is visually estimated at 50-55%.   3. The right ventricle is normal in size, with normal systolic function.   4. There is moderate aortic regurgitation.   5. There is no  pulmonary hypertension.   6. There is no pericardial effusion.  TTE 03/04/2022   1. Concentric left ventricular remodeling with normal systolic function (EF 50-55%).   2. Normal right ventricular size and systolic function.   3. There are no significant valvular abnormalities.   4. There is no pericardial effusion.   TTE 10/2021   1. The left ventricle is mildly dilated in size with normal wall thickness.   2. The left ventricular systolic function is moderately decreased, LVEF is visually estimated at 35-40%.   3. The left atrium is mildly dilated in size.   4. The right ventricle is normal in size, with normal systolic function.   5. There is a moderate, posterior pericardial effusion.   6. There is no echocardiographic evidence of tamponade physiology.   7. When compared to prior study from 09/08/2021, the LV dilation has slightly worsened with overall similar LVEF.  Pericardial effusion has decreased in size.  TTE 02/02/2021   1. The left ventricle is mildly dilated in size with mildly increased wall thickness.   2. The left ventricular systolic function is severely decreased, LVEF is visually estimated at 20%.   3. Calculated LVOT SV: 52 ml.  CO: 5.9 L/min.  CI: 3.4 L/min/m2.   4. There is moderate posteriorly directed regurgitation.   5. The left atrium is moderately dilated in size.   6. The aortic valve is trileaflet with mildly thickened leaflets with normal excursion.   7. There is mild aortic regurgitation.   8. The pulmonary artery is moderately dilated.   9.  There is mild to moderate pulmonic regurgitation.   10. The right ventricle is normal in size, with reduced systolic function.   11. There is moderate tricuspid regurgitation.   12. There is moderate-severe pulmonary hypertension, estimated pulmonary artery systolic pressure is 69 mmHg.   13. IVC size and inspiratory change suggest mildly elevated right atrial pressure. (5-10 mmHg).   14. There is a moderate,  circumferential pericardial effusion.   15. Findings are consistent with elevated intrapericardial pressure but there is low suspicion for acute tamponade physiology.  Limited TTE 01/15/2021   1. The left ventricle is mildly dilated in size with normal wall thickness.   2. The left ventricular systolic function is mildly decreased, LVEF is visually estimated at 40-45%.   3. There is mild mitral valve regurgitation.   4. The right ventricle is normal in size, with normal systolic function.   5. There is a small, circumferential pericardial effusion. No evidence to suggest cardiac tamponade.  TTE 12/13/2020  1. Mildly dilated left ventricle with improved but mildly depressed LV systolic function, EF 49% by both MMode and Simpson's measurements.  2. Small pericardial effusion, grossly similar in size compared to examination of 12/11/20, largest 1.2 -1.4 cm posterior to left ventricle.  3. There is no echocardiographic evidence of tamponade.  4. Mild tricuspid valve regurgitation.  5. Trivial aortic valve insufficiency.  6. Trivial mitral valve insufficiency.  7. Normal right ventricular cavity size and systolic function.  TTE 09/01/2016 (first echo in our system)  1. Moderate-size globally distributed pericardial effusion.  2. Globally distributed pericardial effusion, brief collapse of right atrial free wall(<1/3 of systole) but with no evidence of tamponade.     Effusion measures between 10-18 mm around the ventricles in diastole.  3. Normal left ventricular cavity size and systolic function.  4. No pleural effusions.  5. No right ventricular hypertension.  6. Normal right ventricular cavity size and systolic function.  7. Right ventricular systolic pressure estimate = 16.0 mmHg.  Stress test: None  Cardiac catheterization: 06/12/2024 Findings: Successful TEE-guided catheter-based mechanical thrombectomy (AngioVac) of moderate RA thrombus and fibrotic tissue in the  SVC.   Cardiovascular Surgery: None  EP Procedures and Devices: None  Non-Invasive Studies: ECG 11/26/2021: Sinus tachycardia (101 bpm), T wave inversions in inferior (II, III, aVF) and anterolateral leads (V3-V6)   Relevant Past Medical History: Active Ambulatory Problems    Diagnosis Date Noted   Systemic lupus erythematosus    (CMS-HCC) 09/01/2016   Pericardial effusion without cardiac tamponade (HHS-HCC) 09/01/2016   ISN/RPS class IV glomerulonephritis due to systemic lupus erythematosus (SLE)    (CMS-HCC) 09/16/2016   High risk medication use 12/12/2016   Use teratogenic medication in female of reproductive age 09/03/2018   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/26/2019   SLE (systemic lupus erythematosus)    (CMS-HCC) 09/17/2019   Hypertension 10/19/2020   Seizure    (CMS-HCC) 10/22/2020   Pleural effusion, bilateral 10/31/2020   PRES (posterior reversible encephalopathy syndrome) 10/31/2020   Anemia of chronic disease 10/31/2020   Hypomagnesemia 02/02/2021   Hypocalcemia 02/02/2021   Chronic systolic heart failure (CMS-HCC) 02/04/2021   Acute renal failure superimposed on stage 2 chronic kidney disease 02/04/2021   Hidradenitis suppurativa 02/13/2021   Sepsis    (CMS-HCC) 09/07/2021   Nonintractable epilepsy without status epilepticus    (CMS-HCC) 08/21/2022   Fever 08/21/2022   Iron deficiency 08/21/2022   Hypokalemia 12/15/2022   Peritonitis (CMS-HCC)  12/15/2022   Tachycardia 12/15/2022   Dehydration 12/15/2022   Volume depletion 12/15/2022   Secondary hyperparathyroidism (HHS-HCC) 12/15/2022   Pericarditis associated with systemic lupus erythematosus    (CMS-HCC) 12/26/2022   Mood disorder 01/11/2023   Well woman exam 01/11/2023   Heart failure with recovered ejection fraction (HFrecEF) (CMS-HCC) 02/12/2023   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   MSSA  bacteremia 06/04/2024   Hypophosphatemia 06/04/2024   Hemoperitoneum 06/09/2024   Right atrial thrombus 06/04/2024   Bacteremia 06/04/2024   Gram-positive bacteremia 06/04/2024   ESRD (end stage renal disease) on dialysis    (CMS-HCC) 06/26/2024   ESRD (end stage renal disease) (CMS-HCC) 09/03/2024   ESRD on hemodialysis    (CMS-HCC) 09/04/2024   Resolved Ambulatory Problems    Diagnosis Date Noted   Anti-Smith (anti-Sm) antibody and antiribonuclear protein (anti-RNP) antibody positive 09/01/2016   ANA positive 09/01/2016   Abdominal pain 09/05/2016   Emesis 09/05/2016   Moderate malnutrition (HHS-HCC) 09/06/2016   Weight loss 03/22/2017   Menorrhagia with regular cycle 01/31/2018   Nexplanon in place 05/23/2018   Joint swelling 11/03/2018   Pancytopenia (CMS-HCC) 04/26/2019   Other neutropenia 04/26/2019   Hyperkalemia 10/06/2020   Hyperphosphatemia 10/06/2020   Metabolic acidemia 10/06/2020   Status epilepticus    (CMS-HCC)    COVID-19 in immunocompromised patient (HHS-HCC) 10/31/2020   Past Medical History:  Diagnosis Date   Chronic kidney disease    Fatigue    Headache    Hemolytic anemia    Joint pain    Loss of appetite    Lupus    Nephritis in lupus    (CMS-HCC)    Pneumonia     Patient's Medications  New Prescriptions   No medications on file  Previous Medications   APIXABAN (ELIQUIS) 2.5 MG TAB    Take 1 tablet (2.5 mg total) by mouth two (2) times a day.   CETIRIZINE (ZYRTEC) 5 MG TABLET    Take 1 tablet (5 mg total) by mouth daily.   FERROUS SULFATE 325 (65 FE) MG TABLET    Take 1 tablet (325 mg total) by mouth every other day.   HYDROXYCHLOROQUINE  (PLAQUENIL ) 200 MG TABLET    Take 1 tablet (200 mg total) by mouth daily.   LACOSAMIDE  (VIMPAT ) 200 MG TABLET    Take 1 tablet (200 mg total) by mouth two (2) times a day.   LEVETIRACETAM  (KEPPRA ) 500 MG TABLET    Take 1 tablet (500 mg total) by mouth daily.   LOSARTAN (COZAAR)  50 MG TABLET    Take 1 tablet (50 mg total) by mouth two (2) times a day.   METHOXY PEG-EPOETIN BETA (MIRCERA INJ)    225 mcg.   MULTIVITAMIN WITH FOLIC ACID  400 MCG TAB TABLET    Take 1 tablet by mouth daily.   OXYCODONE  (ROXICODONE ) 5 MG IMMEDIATE RELEASE TABLET    Take 1 tablet (5 mg total) by mouth every six (6) hours as needed for pain for up to 10 doses.   TRIAMCINOLONE (KENALOG) 0.1 % CREAM    Apply topically two (2) times a day.  Modified Medications   No medications on file  Discontinued Medications   No medications on file    Allergies: Allergies  Allergen Reactions   Amlodipine Swelling    Other Reaction(s): swelling gen    Social History: She  reports that she has never smoked. She has never been exposed to tobacco smoke. She has never used smokeless tobacco.  She reports that she does not drink alcohol and does not use drugs.  Family History: Her family history includes Hyperlipidemia in her maternal grandmother; Other in her maternal grandmother.  Review of Systems 10 systems were reviewed and negative except as noted in HPI.    Objective:     Physical Exam There were no vitals taken for this visit.  Wt Readings from Last 3 Encounters:  09/23/24 57.3 kg (126 lb 4.8 oz)  09/09/24 58.1 kg (128 lb 1.6 oz)  09/04/24 59.3 kg (130 lb 12.8 oz)    General:  Alert, well appearing, no distress.  HEENT:  EOMI, sclerae anicteric  Neck: Supple, no carotid bruit. JVP not appreciably elevated  Lungs:   CTAB bilaterally with normal WOB.  Heart:  Regular rhythm, normal rate, normal S1/S2. No murmurs, gallops, or rubs. No s3.  Abdomen:   Soft, non-tender, non-distended.  Extremities: No edema bilaterally.  Skin: No lesions/rashes on exposed skin.  Neurologic: No focal deficits.    Most recent labs   Lab Results  Component Value Date   WBC 7.9 09/23/2024   RBC 4.52 09/23/2024   HGB 11.4 09/23/2024   HCT 36.1 09/23/2024   MCV 79.9 09/23/2024   MCH 25.3 (L)  09/23/2024   MCHC 31.7 (L) 09/23/2024   RDW 20.9 (H) 09/23/2024   PLT 202 09/23/2024   MPV 7.2 09/23/2024    Lab Results  Component Value Date   NA 141 09/09/2024   K  09/09/2024     Comment:     Hemolyzed Specimen   CL 96 (L) 09/09/2024   CO2 27.9 09/09/2024   BUN  09/09/2024     Comment:     Hemolyzed Specimen   CREATININE  09/09/2024     Comment:     Hemolyzed Specimen   GLU 79 09/09/2024   CALCIUM  8.7 09/09/2024   ALBUMIN 2.1 (L) 06/20/2024   PHOS 4.7 06/20/2024    Lab Results  Component Value Date   CHOL 159 02/07/2022   CHOL 248 (H) 02/23/2021   CHOL 141 07/03/2017   Lab Results  Component Value Date   HDL 57 02/07/2022   HDL 77 (H) 02/23/2021   HDL 44 07/03/2017   Lab Results  Component Value Date   LDL 86.0 02/07/2022   LDL 84 02/07/2022   Lab Results  Component Value Date   TRIG 90 02/07/2022   TRIG 104 02/23/2021   TRIG 317 (H) 07/03/2017   No results found for: [facility]  Lab Results  Component Value Date   PT >300.0 (HH) 06/22/2024   INR  06/22/2024     Comment:     Unable to calculate.         Dorn Kapur, MD Cardiovascular Medicine Fellow "

## 2024-10-10 NOTE — Telephone Encounter (Signed)
 Per Dr. Orzechowski,  Paperwork has been completed and provided to patient.

## 2024-10-15 NOTE — Progress Notes (Signed)
 I discussed the patient with the fellow and we jointly developed the plan. I reviewed the fellow's note and the data in the chart. I agree with the fellow's findings and plan.     Franky FELIX Gil, MD, [facility], [facility]  Assistant Professor of Medicine  Division of Cardiology  [place] [facility]

## 2024-10-22 ENCOUNTER — Emergency Department

## 2024-10-22 ENCOUNTER — Inpatient Hospital Stay
Admission: EM | Admit: 2024-10-22 | Discharge: 2024-10-24 | DRG: 640 | Disposition: A | Discharge status: Home / Self Care | Attending: Internal Medicine | Admitting: Internal Medicine

## 2024-10-22 ENCOUNTER — Other Ambulatory Visit: Payer: Self-pay

## 2024-10-22 DIAGNOSIS — R109 Unspecified abdominal pain: Secondary | ICD-10-CM | POA: Diagnosis present

## 2024-10-22 DIAGNOSIS — N186 End stage renal disease: Secondary | ICD-10-CM | POA: Diagnosis present

## 2024-10-22 DIAGNOSIS — I132 Hypertensive heart and chronic kidney disease with heart failure and with stage 5 chronic kidney disease, or end stage renal disease: Secondary | ICD-10-CM | POA: Diagnosis present

## 2024-10-22 DIAGNOSIS — Z83438 Family history of other disorder of lipoprotein metabolism and other lipidemia: Secondary | ICD-10-CM

## 2024-10-22 DIAGNOSIS — D631 Anemia in chronic kidney disease: Secondary | ICD-10-CM | POA: Diagnosis present

## 2024-10-22 DIAGNOSIS — Z79899 Other long term (current) drug therapy: Secondary | ICD-10-CM

## 2024-10-22 DIAGNOSIS — Z7901 Long term (current) use of anticoagulants: Secondary | ICD-10-CM

## 2024-10-22 DIAGNOSIS — E875 Hyperkalemia: Principal | ICD-10-CM | POA: Diagnosis present

## 2024-10-22 DIAGNOSIS — R000 Tachycardia, unspecified: Secondary | ICD-10-CM | POA: Diagnosis present

## 2024-10-22 DIAGNOSIS — Z86718 Personal history of other venous thrombosis and embolism: Secondary | ICD-10-CM | POA: Diagnosis not present

## 2024-10-22 DIAGNOSIS — R188 Other ascites: Secondary | ICD-10-CM | POA: Diagnosis present

## 2024-10-22 DIAGNOSIS — G40909 Epilepsy, unspecified, not intractable, without status epilepticus: Secondary | ICD-10-CM | POA: Diagnosis present

## 2024-10-22 DIAGNOSIS — R1024 Suprapubic pain: Secondary | ICD-10-CM

## 2024-10-22 DIAGNOSIS — Z888 Allergy status to other drugs, medicaments and biological substances status: Secondary | ICD-10-CM | POA: Diagnosis not present

## 2024-10-22 DIAGNOSIS — Z992 Dependence on renal dialysis: Secondary | ICD-10-CM

## 2024-10-22 DIAGNOSIS — M3214 Glomerular disease in systemic lupus erythematosus: Secondary | ICD-10-CM | POA: Diagnosis present

## 2024-10-22 DIAGNOSIS — I5032 Chronic diastolic (congestive) heart failure: Secondary | ICD-10-CM | POA: Diagnosis present

## 2024-10-22 DIAGNOSIS — N2581 Secondary hyperparathyroidism of renal origin: Secondary | ICD-10-CM | POA: Diagnosis present

## 2024-10-22 LAB — CBC
HCT: 38.4 % (ref 36.0–46.0)
HCT: 34.3 % — ABNORMAL LOW (ref 36.0–46.0)
HCT: 35.2 % — ABNORMAL LOW (ref 36.0–46.0)
Hemoglobin: 11.7 g/dL — ABNORMAL LOW (ref 12.0–15.0)
Hemoglobin: 10.4 g/dL — ABNORMAL LOW (ref 12.0–15.0)
Hemoglobin: 10.5 g/dL — ABNORMAL LOW (ref 12.0–15.0)
MCH: 24.4 pg — ABNORMAL LOW (ref 26.0–34.0)
MCH: 24.5 pg — ABNORMAL LOW (ref 26.0–34.0)
MCH: 24.2 pg — ABNORMAL LOW (ref 26.0–34.0)
MCHC: 30.5 g/dL (ref 30.0–36.0)
MCHC: 30.3 g/dL (ref 30.0–36.0)
MCHC: 29.8 g/dL — ABNORMAL LOW (ref 30.0–36.0)
MCV: 80 fL (ref 80.0–100.0)
MCV: 80.9 fL (ref 80.0–100.0)
MCV: 81.3 fL (ref 80.0–100.0)
Platelets: 232 10*3/uL (ref 150–400)
Platelets: 214 10*3/uL (ref 150–400)
Platelets: 201 10*3/uL (ref 150–400)
RBC: 4.8 MIL/uL (ref 3.87–5.11)
RBC: 4.24 MIL/uL (ref 3.87–5.11)
RBC: 4.33 MIL/uL (ref 3.87–5.11)
RDW: 19.4 % — ABNORMAL HIGH (ref 11.5–15.5)
RDW: 19.1 % — ABNORMAL HIGH (ref 11.5–15.5)
RDW: 19.1 % — ABNORMAL HIGH (ref 11.5–15.5)
WBC: 7.3 10*3/uL (ref 4.0–10.5)
WBC: 7 10*3/uL (ref 4.0–10.5)
WBC: 5.1 10*3/uL (ref 4.0–10.5)
nRBC: 0 % (ref 0.0–0.2)
nRBC: 0 % (ref 0.0–0.2)
nRBC: 0 % (ref 0.0–0.2)

## 2024-10-22 LAB — COMPREHENSIVE METABOLIC PANEL WITH GFR
ALT: <5 U/L (ref 0–44)
AST: 19 U/L (ref 15–41)
Albumin: 4 g/dL (ref 3.5–5.0)
Alkaline Phosphatase: 157 U/L — ABNORMAL HIGH (ref 38–126)
Anion gap: 17 — ABNORMAL HIGH (ref 5–15)
BUN: 68 mg/dL — ABNORMAL HIGH (ref 6–20)
CO2: 25 mmol/L (ref 22–32)
Calcium: 8.9 mg/dL (ref 8.9–10.3)
Chloride: 90 mmol/L — ABNORMAL LOW (ref 98–111)
Creatinine, Ser: 11.3 mg/dL — ABNORMAL HIGH (ref 0.44–1.00)
GFR, Estimated: 4 mL/min — ABNORMAL LOW
Glucose, Bld: 97 mg/dL (ref 70–99)
Potassium: >7.5 mmol/L (ref 3.5–5.1)
Sodium: 133 mmol/L — ABNORMAL LOW (ref 135–145)
Total Bilirubin: 0.3 mg/dL (ref 0.0–1.2)
Total Protein: 8.5 g/dL — ABNORMAL HIGH (ref 6.5–8.1)

## 2024-10-22 LAB — RENAL FUNCTION PANEL
Albumin: 3.8 g/dL (ref 3.5–5.0)
Anion gap: 13 (ref 5–15)
BUN: 30 mg/dL — ABNORMAL HIGH (ref 6–20)
CO2: 28 mmol/L (ref 22–32)
Calcium: 8.7 mg/dL — ABNORMAL LOW (ref 8.9–10.3)
Chloride: 91 mmol/L — ABNORMAL LOW (ref 98–111)
Creatinine, Ser: 6.86 mg/dL — ABNORMAL HIGH (ref 0.44–1.00)
GFR, Estimated: 8 mL/min — ABNORMAL LOW
Glucose, Bld: 83 mg/dL (ref 70–99)
Phosphorus: 5 mg/dL — ABNORMAL HIGH (ref 2.5–4.6)
Potassium: 5.1 mmol/L (ref 3.5–5.1)
Sodium: 133 mmol/L — ABNORMAL LOW (ref 135–145)

## 2024-10-22 LAB — BASIC METABOLIC PANEL WITH GFR
Anion gap: 17 — ABNORMAL HIGH (ref 5–15)
BUN: 69 mg/dL — ABNORMAL HIGH (ref 6–20)
CO2: 25 mmol/L (ref 22–32)
Calcium: 8.8 mg/dL — ABNORMAL LOW (ref 8.9–10.3)
Chloride: 90 mmol/L — ABNORMAL LOW (ref 98–111)
Creatinine, Ser: 11.3 mg/dL — ABNORMAL HIGH (ref 0.44–1.00)
GFR, Estimated: 4 mL/min — ABNORMAL LOW
Glucose, Bld: 88 mg/dL (ref 70–99)
Potassium: >7.5 mmol/L (ref 3.5–5.1)
Sodium: 133 mmol/L — ABNORMAL LOW (ref 135–145)

## 2024-10-22 LAB — HCG, QUANTITATIVE, PREGNANCY: hCG, Beta Chain, Quant, S: 2 m[IU]/mL

## 2024-10-22 LAB — PROTIME-INR
INR: 1.1 (ref 0.8–1.2)
Prothrombin Time: 14.8 s (ref 11.4–15.2)

## 2024-10-22 LAB — MAGNESIUM: Magnesium: 2.6 mg/dL — ABNORMAL HIGH (ref 1.7–2.4)

## 2024-10-22 LAB — LACTIC ACID, PLASMA: Lactic Acid, Venous: 1.8 mmol/L (ref 0.5–1.9)

## 2024-10-22 LAB — POTASSIUM: Potassium: 5.4 mmol/L — ABNORMAL HIGH (ref 3.5–5.1)

## 2024-10-22 LAB — CBG MONITORING, ED
Glucose-Capillary: 166 mg/dL — ABNORMAL HIGH (ref 70–99)
Glucose-Capillary: 26 mg/dL — CL (ref 70–99)

## 2024-10-22 LAB — APTT: aPTT: 32 s (ref 24–36)

## 2024-10-22 LAB — LIPASE, BLOOD: Lipase: 244 U/L — ABNORMAL HIGH (ref 11–51)

## 2024-10-22 MED ORDER — INSULIN ASPART 100 UNIT/ML IJ SOLN
5.0000 [IU] | Freq: Once | INTRAMUSCULAR | Status: AC
  Administered 2024-10-22: 5 [IU] via INTRAVENOUS
  Filled 2024-10-22: qty 5

## 2024-10-22 MED ORDER — CALCIUM GLUCONATE-NACL 1-0.675 GM/50ML-% IV SOLN
1.0000 g | Freq: Once | INTRAVENOUS | Status: AC
  Administered 2024-10-22: 1000 mg via INTRAVENOUS
  Filled 2024-10-22: qty 50

## 2024-10-22 MED ORDER — IOHEXOL 300 MG/ML  SOLN
100.0000 mL | Freq: Once | INTRAMUSCULAR | Status: AC | PRN
  Administered 2024-10-22: 100 mL via INTRAVENOUS

## 2024-10-22 MED ORDER — SODIUM BICARBONATE 8.4 % IV SOLN
50.0000 meq | Freq: Once | INTRAVENOUS | Status: AC
  Administered 2024-10-22: 50 meq via INTRAVENOUS
  Filled 2024-10-22: qty 50

## 2024-10-22 MED ORDER — LIDOCAINE-PRILOCAINE 2.5-2.5 % EX CREA: 1.0000 | TOPICAL_CREAM | CUTANEOUS | Status: DC | PRN

## 2024-10-22 MED ORDER — HYDROMORPHONE HCL 1 MG/ML IJ SOLN
0.5000 mg | INTRAMUSCULAR | Status: DC | PRN
  Administered 2024-10-22: 1 mg via INTRAVENOUS
  Filled 2024-10-22: qty 1

## 2024-10-22 MED ORDER — ALTEPLASE 2 MG IJ SOLR: 2.0000 mg | Freq: Once | INTRAMUSCULAR | Status: DC | PRN

## 2024-10-22 MED ORDER — HEPARIN SODIUM (PORCINE) 5000 UNIT/ML IJ SOLN
5000.0000 [IU] | Freq: Two times a day (BID) | INTRAMUSCULAR | Status: DC
  Administered 2024-10-22 – 2024-10-24 (×4): 5000 [IU] via SUBCUTANEOUS
  Filled 2024-10-22 (×4): qty 1

## 2024-10-22 MED ORDER — ONDANSETRON HCL 4 MG PO TABS: 4.0000 mg | ORAL_TABLET | Freq: Four times a day (QID) | ORAL | Status: DC | PRN

## 2024-10-22 MED ORDER — LORATADINE 10 MG PO TABS
10.0000 mg | ORAL_TABLET | Freq: Every day | ORAL | Status: DC
  Administered 2024-10-22 – 2024-10-24 (×3): 10 mg via ORAL
  Filled 2024-10-22 (×3): qty 1

## 2024-10-22 MED ORDER — MORPHINE SULFATE (PF) 4 MG/ML IV SOLN
4.0000 mg | Freq: Once | INTRAVENOUS | Status: AC
  Administered 2024-10-22: 4 mg via INTRAVENOUS
  Filled 2024-10-22: qty 1

## 2024-10-22 MED ORDER — LEVETIRACETAM 500 MG PO TABS
500.0000 mg | ORAL_TABLET | Freq: Every day | ORAL | Status: DC
  Administered 2024-10-22 – 2024-10-24 (×3): 500 mg via ORAL
  Filled 2024-10-22 (×3): qty 1

## 2024-10-22 MED ORDER — ONDANSETRON HCL 4 MG/2ML IJ SOLN: 4.0000 mg | Freq: Four times a day (QID) | INTRAMUSCULAR | Status: DC | PRN

## 2024-10-22 MED ORDER — HYDROXYCHLOROQUINE SULFATE 200 MG PO TABS
200.0000 mg | ORAL_TABLET | Freq: Every day | ORAL | Status: DC
  Administered 2024-10-23 – 2024-10-24 (×2): 200 mg via ORAL
  Filled 2024-10-22 (×2): qty 1

## 2024-10-22 MED ORDER — PATIROMER SORBITEX CALCIUM 8.4 G PO PACK
25.2000 g | PACK | ORAL | Status: AC
  Administered 2024-10-22: 25.2 g via ORAL
  Filled 2024-10-22: qty 3

## 2024-10-22 MED ORDER — PENTAFLUOROPROP-TETRAFLUOROETH EX AERO: 1.0000 | INHALATION_SPRAY | CUTANEOUS | Status: DC | PRN

## 2024-10-22 MED ORDER — MORPHINE SULFATE (PF) 2 MG/ML IV SOLN
INTRAVENOUS | Status: AC
  Filled 2024-10-22: qty 2

## 2024-10-22 MED ORDER — LIDOCAINE HCL (PF) 1 % IJ SOLN: 5.0000 mL | INTRAMUSCULAR | Status: DC | PRN

## 2024-10-22 MED ORDER — ZOLPIDEM TARTRATE 5 MG PO TABS: 5.0000 mg | ORAL_TABLET | Freq: Every evening | ORAL | Status: DC | PRN

## 2024-10-22 MED ORDER — DEXTROSE 10 % IV BOLUS
250.0000 mL | Freq: Once | INTRAVENOUS | Status: DC
  Filled 2024-10-22: qty 500

## 2024-10-22 MED ORDER — SENNOSIDES-DOCUSATE SODIUM 8.6-50 MG PO TABS: 1.0000 | ORAL_TABLET | Freq: Every evening | ORAL | Status: DC | PRN

## 2024-10-22 MED ORDER — HEPARIN SODIUM (PORCINE) 1000 UNIT/ML DIALYSIS: 1000.0000 [IU] | INTRAMUSCULAR | Status: DC | PRN

## 2024-10-22 MED ORDER — POLYETHYLENE GLYCOL 3350 17 G PO PACK: 17.0000 g | PACK | Freq: Every day | ORAL | Status: DC | PRN

## 2024-10-22 MED ORDER — NEPRO/CARBSTEADY PO LIQD
237.0000 mL | Freq: Three times a day (TID) | ORAL | Status: DC
  Administered 2024-10-22 – 2024-10-24 (×6): 237 mL via ORAL

## 2024-10-22 MED ORDER — DEXTROSE 50 % IV SOLN: 1.0000 | INTRAVENOUS | Status: AC

## 2024-10-22 MED ORDER — METOPROLOL SUCCINATE ER 25 MG PO TB24: 25.0000 mg | ORAL_TABLET | Freq: Every day | ORAL | Status: DC

## 2024-10-22 MED ORDER — LACOSAMIDE 50 MG PO TABS
200.0000 mg | ORAL_TABLET | Freq: Two times a day (BID) | ORAL | Status: DC
  Administered 2024-10-22 – 2024-10-24 (×5): 200 mg via ORAL
  Filled 2024-10-22 (×5): qty 4

## 2024-10-22 MED ORDER — DEXTROSE 50 % IV SOLN
INTRAVENOUS | Status: AC
  Administered 2024-10-22: 50 mL via INTRAVENOUS
  Filled 2024-10-22: qty 50

## 2024-10-22 MED ORDER — HEPARIN SODIUM (PORCINE) 1000 UNIT/ML IJ SOLN
INTRAMUSCULAR | Status: AC
  Filled 2024-10-22: qty 4

## 2024-10-22 MED ORDER — DIPHENHYDRAMINE HCL 50 MG/ML IJ SOLN
25.0000 mg | Freq: Once | INTRAMUSCULAR | Status: AC
  Administered 2024-10-22: 25 mg via INTRAVENOUS

## 2024-10-22 MED ORDER — HYDROXYCHLOROQUINE SULFATE 200 MG PO TABS: 400.0000 mg | ORAL_TABLET | Freq: Every day | ORAL | Status: DC

## 2024-10-22 MED ORDER — DIPHENHYDRAMINE HCL 50 MG/ML IJ SOLN
25.0000 mg | Freq: Once | INTRAMUSCULAR | Status: AC
  Administered 2024-10-22: 25 mg via INTRAVENOUS
  Filled 2024-10-22: qty 1

## 2024-10-22 MED ORDER — DIPHENHYDRAMINE HCL 50 MG/ML IJ SOLN
INTRAMUSCULAR | Status: AC
  Filled 2024-10-22: qty 1

## 2024-10-22 MED ORDER — ACETAMINOPHEN 500 MG PO TABS: 1000.0000 mg | ORAL_TABLET | Freq: Four times a day (QID) | ORAL | Status: DC | PRN

## 2024-10-22 MED ORDER — CHLORHEXIDINE GLUCONATE CLOTH 2 % EX PADS
6.0000 | MEDICATED_PAD | Freq: Every day | CUTANEOUS | Status: DC
  Administered 2024-10-22 – 2024-10-24 (×2): 6 via TOPICAL
  Filled 2024-10-22: qty 6

## 2024-10-22 NOTE — Progress Notes (Signed)
 Patient admitted to room 204. As soon as she arrived she requested doctor come at bedside. This RN tried to gather more information to message the dr what was going. Patient reserved and requested Dr come see her. Message the MD. Delivered the message to the patient. MD had already talked to the patient about Plan of care. Patient upset and family member at bedside as well upset. Informed charge nurse about it.

## 2024-10-22 NOTE — H&P (Addendum)
 " History and Physical    Melinda Steele FMW:[id number] DOB: Jan 17, 2001 DOA: 10/22/2024  [facility] (Confirm with patient/family/NH records and if not entered, this has to be entered at [facility] point of entry) Patient coming from: Home  I have personally briefly reviewed patient's old medical records in [facility]  Chief Complaint: Belly hurts  HPI: Melinda Steele is a 24 y.o. 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  Patient reported that she has been having intermittent lower abdominal pain for few weeks, worsening in the last 3 days.  She attributed her symptoms to intermittent constipation, she used MiraLAX  with little relief.  She denies any nauseous vomiting no fever or chills.  Last dialysis was Saturday.  She longer makes any urine. ED Course: Afebrile, nontachycardic blood pressure 130/90 O2 saturation 94% on room air.  Blood work showed WBC 7.3 hemoglobin 11.7 from the previous 11.0-12.0, BUN 68 creatinine 11.3 K> 7.5, bicarb 25 lipase 244.  EKG showed tented T waves on multiple leads.  Patient was given hyperkalemia cocktail calcium  gluconate, insulin  and dextrose  and Lokelma , patient is currently in the emergency dialysis.  Review of Systems: As per HPI otherwise 14 point review of systems negative.    Past Medical History:  Diagnosis Date   Hypertension    Lupus    Lupus nephritis (HCC)    Seizure (HCC)     Past Surgical History:  Procedure Laterality Date   CAPD INSERTION N/A 08/27/2021   Procedure: LAPAROSCOPIC INSERTION CONTINUOUS AMBULATORY PERITONEAL DIALYSIS  (CAPD) CATHETER;  Surgeon: Jordis Laneta FALCON, MD;  Location: [facility] ORS;  Service: General;  Laterality: N/A;   DIALYSIS/PERMA CATHETER INSERTION N/A 08/23/2021   Procedure: DIALYSIS/PERMA CATHETER INSERTION;  Surgeon: Marea Selinda RAMAN, MD;  Location: [facility]  INVASIVE CV LAB;  Service: Cardiovascular;  Laterality: N/A;     reports that she has never smoked. She has never used smokeless tobacco. She reports that she does not drink alcohol and does not use drugs.  Allergies[1]  Family History  Problem Relation Age of Onset   Hyperlipidemia Maternal Grandmother        a. many medical problems, unknown per prior note     Prior to Admission medications  Medication Sig Start Date End Date Taking? Authorizing Provider  acetaminophen  (TYLENOL ) 500 MG tablet Take 2 tablets (1,000 mg total) by mouth every 6 (six) hours as needed. 03/18/24 03/18/25 Yes Clarine Ozell LABOR, MD  apixaban (ELIQUIS) 2.5 MG TABS tablet Take 2.5 mg by mouth 2 (two) times daily. 10/08/24 01/06/25 Yes [provider]  cetirizine (ZYRTEC) 5 MG tablet Take 5 mg by mouth daily. 09/04/24 09/04/25 Yes [provider]  hydroxychloroquine  (PLAQUENIL ) 200 MG tablet Take 400 mg by mouth daily.   Yes [provider]  lacosamide  (VIMPAT ) 200 MG TABS tablet Take 200 mg by mouth 2 (two) times daily.   Yes [provider]  levETIRAcetam  (KEPPRA ) 500 MG tablet Take 500 mg by mouth daily.   Yes [provider]  polyethylene glycol (MIRALAX  / GLYCOLAX ) 17 g packet Take 17 g by mouth daily as needed for moderate constipation. 08/28/21  Yes Caleen Qualia, MD  BENLYSTA  200 MG/ML SOAJ Inject 200 mg into the skin once a week. Thursday Patient not taking: Reported on 12/18/2023 01/26/23   [provider]  calcitRIOL  (ROCALTROL ) 0.5 MCG capsule Take 0.5 mcg by  mouth daily. Patient not taking: Reported on 12/18/2023 06/30/22   [provider]  calcium  acetate (PHOSLO ) 667 MG capsule Take 667 mg by mouth 3 (three) times daily with meals. Patient not taking: Reported on 12/18/2023    [provider]  carvedilol  (COREG ) 25 MG tablet Take 25 mg by mouth daily. Patient not taking: Reported on 02/04/2023    [provider]  Cholecalciferol  (VITAMIN D3) 10 MCG (400 UNIT) tablet Take 400 Units by mouth daily. Patient not taking: Reported on 12/18/2023 01/20/23   [provider]  cloNIDine  (CATAPRES  - DOSED IN MG/24 HR) 0.3 mg/24hr patch Place 1 patch (0.3 mg total) onto the skin once a week. Patient not taking: Reported on 02/04/2023 12/10/20   Kasa, Kurian, MD  hydrOXYzine  (ATARAX ) 25 MG tablet Take 25 mg by mouth 2 (two) times daily as needed for anxiety. Patient not taking: Reported on 12/18/2023 01/30/23   [provider]  isosorbide -hydrALAZINE  (BIDIL ) 20-37.5 MG tablet Take 1 tablet by mouth 3 (three) times daily. Patient not taking: Reported on 02/04/2023 08/28/21   Amin, Sumayya, MD  losartan (COZAAR) 50 MG tablet Take 50 mg by mouth in the morning and at bedtime. Patient not taking: Reported on 10/22/2024 06/22/24   [provider]  methylPREDNISolone  sodium succinate (SOLU-MEDROL ) 125 mg/2 mL injection Inject 1.28 mLs (80 mg total) into the vein daily. Please taper slowly to her home dose Patient not taking: Reported on 02/04/2023 08/28/21   Amin, Sumayya, MD  metoprolol  succinate (TOPROL -XL) 25 MG 24 hr tablet Take 25 mg by mouth daily. 02/03/23 02/03/24  [provider]  multivitamin (RENA-VIT) TABS tablet Take 1 tablet by mouth at bedtime. Patient not taking: Reported on 02/04/2023 08/28/21   Amin, Sumayya, MD  Mycophenolate Sodium (MYCOPHENOLIC ACID) 360 MG TBEC Take 1 tablet by mouth 2 (two) times daily. Patient not taking: Reported on 10/22/2024    [provider]  Nutritional Supplements (FEEDING SUPPLEMENT, NEPRO CARB STEADY,) LIQD Take 237 mLs by mouth 3 (three) times daily between meals. 08/28/21   Amin, Sumayya, MD  pantoprazole  (PROTONIX ) 40 MG tablet Take 1 tablet (40 mg total) by mouth daily. Patient not taking: Reported on 10/22/2024 08/28/21   Amin, Sumayya, MD  pravastatin  (PRAVACHOL ) 20 MG tablet Take 20 mg by mouth daily. Patient not taking: Reported on 02/04/2023    [provider]  promethazine  (PHENERGAN ) 12.5 MG tablet Take 12.5 mg by mouth every 6 (six) hours as needed for nausea or vomiting. Patient not taking: Reported on 12/18/2023 01/11/23   [provider]  sacubitril -valsartan  (ENTRESTO ) 24-26 MG Take 1 tablet by mouth 2 (two) times daily. Patient not taking: Reported on 02/04/2023 08/28/21   Amin, Sumayya, MD  spironolactone  (ALDACTONE ) 25 MG tablet Take 12.5 mg by mouth daily. Patient not taking: Reported on 02/04/2023    [provider]  torsemide (DEMADEX) 20 MG tablet Take 40 mg by mouth daily. Patient not taking: Reported on 02/04/2023    [provider]    Physical Exam: Vitals:   10/22/24 0600 10/22/24 0730 10/22/24 0752 10/22/24 0830  BP: (!) 127/94 (!) 133/96 (!) 122/90 123/87  Pulse: 82 97 78 73  Resp: 12 18 17  (!) 25  Temp:  97.9 F (36.6 C)    TempSrc:      SpO2:  100% 100% 100%  Weight:  56 kg    Height:        Constitutional: NAD, calm, comfortable Vitals:   10/22/24 0600 10/22/24  0730 10/22/24 0752 10/22/24 0830  BP: (!) 127/94 (!) 133/96 (!) 122/90 123/87  Pulse: 82 97 78 73  Resp: 12 18 17  (!) 25  Temp:  97.9 F (36.6 C)    TempSrc:      SpO2:  100% 100% 100%  Weight:  56 kg    Height:       Eyes: PERRL, lids and conjunctivae normal ENMT: Mucous membranes are moist. Posterior pharynx clear of any exudate or lesions.Normal dentition.  Neck: normal, supple, no masses, no thyromegaly Respiratory: clear to auscultation bilaterally, no wheezing, no crackles. Normal respiratory effort. No accessory muscle use.  Cardiovascular: Regular rate and rhythm, no murmurs / rubs / gallops. No extremity edema. 2+ pedal pulses. No carotid bruits.  Abdomen: Mild tenderness on lower abdomen, no rebound no guarding, no masses palpated. No hepatosplenomegaly. Bowel sounds positive.  Musculoskeletal: no clubbing / cyanosis. No joint deformity upper and lower extremities. Good ROM, no contractures. Normal  muscle tone.  Skin: no rashes, lesions, ulcers. No induration Neurologic: CN 2-12 grossly intact. Sensation intact, DTR normal. Strength 5/5 in all 4.  Psychiatric: Normal judgment and insight. Alert and oriented x 3. Normal mood.     Labs on Admission: I have personally reviewed following labs and imaging studies  CBC: Recent Labs  Lab 10/22/24 0218 10/22/24 0604  WBC 7.3 7.0  HGB 11.7* 10.4*  HCT 38.4 34.3*  MCV 80.0 80.9  PLT 232 214   Basic Metabolic Panel: Recent Labs  Lab 10/22/24 0218 10/22/24 0338 10/22/24 0557  NA 133* 133*  --   K >7.5* >7.5* 5.4*  CL 90* 90*  --   CO2 25 25  --   GLUCOSE 97 88  --   BUN 68* 69*  --   CREATININE 11.30* 11.30*  --   CALCIUM  8.9 8.8*  --   MG 2.6*  --   --    GFR: Estimated Creatinine Clearance: 6.8 mL/min (A) (by C-G formula based on SCr of 11.3 mg/dL (H)). Liver Function Tests: Recent Labs  Lab 10/22/24 0218  AST 19  ALT <5  ALKPHOS 157*  BILITOT 0.3  PROT 8.5*  ALBUMIN 4.0   Recent Labs  Lab 10/22/24 0218  LIPASE 244*   No results for input(s): AMMONIA in the last 168 hours. Coagulation Profile: Recent Labs  Lab 10/22/24 0604  INR 1.1   Cardiac Enzymes: No results for input(s): CKTOTAL, CKMB, CKMBINDEX, TROPONINI in the last 168 hours. BNP (last 3 results) No results for input(s): PROBNP in the last 8760 hours. HbA1C: No results for input(s): HGBA1C in the last 72 hours. CBG: Recent Labs  Lab 10/22/24 0456 10/22/24 0516  GLUCAP 26* 166*   Lipid Profile: No results for input(s): CHOL, HDL, LDLCALC, TRIG, CHOLHDL, LDLDIRECT in the last 72 hours. Thyroid Function Tests: No results for input(s): TSH, T4TOTAL, FREET4, T3FREE, THYROIDAB in the last 72 hours. Anemia Panel: No results for input(s): VITAMINB12, FOLATE, FERRITIN, TIBC, IRON, RETICCTPCT in the last 72 hours. Urine analysis:    Component Value Date/Time   COLORURINE (A) 02/04/2023 1017     SPECIMEN/CONTAINER TYPE INAPPROPRIATE FOR ORDERED TEST, UNABLE TO PERFORM   APPEARANCEUR (A) 02/04/2023 1017    SPECIMEN/CONTAINER TYPE INAPPROPRIATE FOR ORDERED TEST, UNABLE TO PERFORM   LABSPEC  02/04/2023 1017    SPECIMEN/CONTAINER TYPE INAPPROPRIATE FOR ORDERED TEST, UNABLE TO PERFORM   PHURINE  02/04/2023 1017    SPECIMEN/CONTAINER TYPE INAPPROPRIATE FOR ORDERED TEST, UNABLE TO PERFORM   GLUCOSEU (A)  02/04/2023 1017    SPECIMEN/CONTAINER TYPE INAPPROPRIATE FOR ORDERED TEST, UNABLE TO PERFORM   HGBUR (A) 02/04/2023 1017    SPECIMEN/CONTAINER TYPE INAPPROPRIATE FOR ORDERED TEST, UNABLE TO PERFORM   BILIRUBINUR (A) 02/04/2023 1017    SPECIMEN/CONTAINER TYPE INAPPROPRIATE FOR ORDERED TEST, UNABLE TO PERFORM   KETONESUR (A) 02/04/2023 1017    SPECIMEN/CONTAINER TYPE INAPPROPRIATE FOR ORDERED TEST, UNABLE TO PERFORM   PROTEINUR (A) 02/04/2023 1017    SPECIMEN/CONTAINER TYPE INAPPROPRIATE FOR ORDERED TEST, UNABLE TO PERFORM   NITRITE (A) 02/04/2023 1017    SPECIMEN/CONTAINER TYPE INAPPROPRIATE FOR ORDERED TEST, UNABLE TO PERFORM   LEUKOCYTESUR (A) 02/04/2023 1017    SPECIMEN/CONTAINER TYPE INAPPROPRIATE FOR ORDERED TEST, UNABLE TO PERFORM    Radiological Exams on Admission: CT ABDOMEN PELVIS W CONTRAST Result Date: 10/22/2024 EXAM: CT ABDOMEN AND PELVIS WITH CONTRAST 10/22/2024 06:42:40 AM TECHNIQUE: CT of the abdomen and pelvis was performed with the administration of intravenous contrast. Multiplanar reformatted images are provided for review. Automated exposure control, iterative reconstruction, and/or weight-based adjustment of the mA/kV was utilized to reduce the radiation dose to as low as reasonably achievable. COMPARISON: 02/04/2023 CLINICAL HISTORY: Abdominal pain, acute, nonlocalized; CT imaging with contrast IV now (Dr. Joan, radiology aware). Abdominal pain, acute, nonlocalized. FINDINGS: LOWER CHEST: No acute abnormality. LIVER: The liver is unremarkable. GALLBLADDER AND BILE  DUCTS: Gallbladder is unremarkable. No biliary ductal dilatation. SPLEEN: The spleen is within normal limits in size and appearance. PANCREAS: The pancreas is short. ADRENAL GLANDS: Normal size and morphology bilaterally. No nodule, thickening, or hemorrhage. No periadrenal stranding. KIDNEYS, URETERS AND BLADDER: Bilateral renal cortical volume loss, similar to previous exam. No stones in the kidneys or ureters. No hydronephrosis. No perinephric or periureteral stranding. Urinary bladder is decompressed. GI AND BOWEL: Stomach appears normal. Within the right hemiabdomen there are a few prominent loops of small bowel which are nonpathologically increased in caliber, measuring up to 2.1 cm in diameter with air-fluid level. No significant bowel wall thickening, inflammation, or distention of the colon. Moderate retained stool identified within the colon and rectum. PERITONEUM AND RETROPERITONEUM: Multifocal areas of mildly complex fluid identified within the bilateral abdomen and pelvis with signs of early loculation including mild thickening and enhancement of the overlying peritoneal reflections. The fluid measures 23 Hounsfield units along the right paracolic gutter. In the right hemipelvis, this measures 19 Hounsfield units. No free air. VASCULATURE: Vascular structures of the abdomen appear patent. Aorta is normal in caliber. LYMPH NODES: No lymphadenopathy. REPRODUCTIVE ORGANS: The uterus appears normal. Measures 1.7 cm, image 64/2. BONES AND SOFT TISSUES: No acute osseous abnormality. No focal soft tissue abnormality. IMPRESSION: 1. Multifocal mildly complex abdominopelvic fluid with early loculation and mild peritoneal thickening/enhancement. Differential diagnosis for the complex fluid includes peritonitis (infectious, inflammatory, chemical), malignancy (peritoneal carcinomatosis), pancreatitis, ruptured ectopic pregnancy, or ruptured ovarian cyst. Given the Hounsfield units, the fluid is likely exudative or  hemorrhagic. Clinical correlation is recommended. Consider diagnostic paracentesis for fluid analysis. 2. Few mildly prominent right abdominal small-bowel loops with air-fluid levels without a discrete transition point, favored mild ileus/enteritis rather than obstruction. 3. Moderate retained stool in the colon and rectum. Electronically signed by: Waddell Joan MD 10/22/2024 07:01 AM EST RP Workstation: [id number]   CT ABDOMEN PELVIS WO CONTRAST Result Date: 10/22/2024 EXAM: CT ABDOMEN AND PELVIS WITHOUT CONTRAST 10/22/2024 05:30:06 AM TECHNIQUE: CT of the abdomen and pelvis was performed without the administration of intravenous contrast. Multiplanar reformatted images are provided for review. Automated exposure control, iterative reconstruction, and/or  weight-based adjustment of the mA/kV was utilized to reduce the radiation dose to as low as reasonably achievable. COMPARISON: 02/04/2023 CLINICAL HISTORY: Abdominal pain, acute (Ped 0-17y); Patient reports feeling of constipation, reports not passing any flatus or stool for 3 days. Pain over the mid to lower abdomen. FINDINGS: LOWER CHEST: No acute abnormality. LIVER: The liver is unremarkable. GALLBLADDER AND BILE DUCTS: Gallbladder is unremarkable. No biliary ductal dilatation. SPLEEN: No acute abnormality. PANCREAS: Pancreas divisum anatomy is suspected. ADRENAL GLANDS: No acute abnormality. KIDNEYS, URETERS AND BLADDER: Mild cortical volume loss from both kidneys. No nephrolithiasis or obstructive uropathy identified. Urinary bladder is decompressed. No perinephric or periureteral stranding. GI AND BOWEL: Assessment of bowel pathology is limited due to lack of intravenous and oral contrast material. No pathologic dilatation of the large or small bowel loops. Moderate stool burden noted within the colon. Normal appendix. PERITONEUM AND RETROPERITONEUM: There is a small-to-moderate volume of complex fluid within the abdomen and pelvis which measures 33  Hounsfield units along the right lower quadrant. This measures 32 Hounsfield units along the left paracolic gutter. No free air. VASCULATURE: Aorta is normal in caliber. LYMPH NODES: No lymphadenopathy. REPRODUCTIVE ORGANS: The uterus appears normal. Suboptimal visualization of the adnexal structures, separate from the unopacified pelvic bowel loops and complex fluid. BONES AND SOFT TISSUES: Previous peritoneal dialysis catheter has been removed. No acute osseous abnormality. No focal soft tissue abnormality. IMPRESSION: 1. Small-to-moderate volume complex abdominopelvic free fluid. The complex nature of the fluid (Hounsfield units 32-33) suggests possibilities such as exudative ascites (due to inflammation, infection, or malignancy), hemorrhagic ascites (due to trauma, ruptured ectopic pregnancy, or other bleeding source), or chylous ascites. Consider further imaging such as a contrast-enhanced CT or MRI for better characterization of the fluid, evaluation of the peritoneum, and assessment for underlying pathology. Surgical consultation is recommended if there is high suspicion for an acute surgical process. 2. No bowel obstruction. Moderate colonic stool burden, consistent with constipation. 3. Normal appendix. 4. These findings were discussed with the ordering provider at the time of interpretation on 10/22/2024. I personally spoke with Dr. Dicky, who acknowledged these findings. Electronically signed by: Waddell Calk MD 10/22/2024 06:19 AM EST RP Workstation: [id number]    EKG: Independently reviewed.  Sinus rhythm, tented T waves.  Assessment/Plan Principal Problem:   Hyperkalemia Active Problems:   Abdominal pain  (please populate well all problems here in Problem List. (For example, if patient is on BP meds at home and you resume or decide to hold them, it is a problem that needs to be her. Same for CAD, COPD, HLD and so on)  Hyperkalemia - Emergency dialysis today.  Acute on chronic lower  abdominal pain - CT abdominal pelvis showed mild complex abdominal pelvic fluid collection, unclear etiology differential including infectious inflammatory chemical versus bleeding. - General Surgeon consulted - Her abdominal exam benign, no signs of bowel rupture or acute peritonitis. -Other DDx, review of patient history showed that she was hospitalized in September 2025 at [facility] when she was diagnosed with persistent MSSA bacteremia and TEE showed right atrium thrombosis, subsequently underwent thrombectomy.  During heparin  drip bridging, patient developed significant right oral artery bleeding and abdominal pelvic hemoperitoneum, so today's CT finding possibly represent residue blood from September?  Plan to hold off Eliquis and start chemical DVT prophylaxis for now - Monitor off antibiotics  Elevated lipase -No clear etiology, no Hx of EtOh drink, CT showed normal pancrease. No OB/GYN etiology suspected. Repeat lipase level tomorrow.  History of  right atrium thrombosis - Hold off Eliquis, start chemical DVT prophylaxis with heparin  subcu for now.  Reasoning as above.  Probably hypercoagulable state SLE -Had a significant bleeding event during heparin  therapy, later [facility] hematology cleared the patient for long-term anticoagulation  Chronic HFrEF with recovered LVEF - Fluid overload, - HD this morning  Seizure disorder - No acute concerns, continue antiseizure regimen  DVT prophylaxis: Heparin  subcu Code Status: Full code Family Communication: Mother at bedside Disposition Plan: Expect more than 2 midnight hospital stay, patient is sick with CHF decompensation and given that she also has ESRD will need additional HD sessions likely for 2 days in a row, expect more than 2 midnight hospital stay Consults called: Nephrology Admission status: Telemetry admission   Cort ONEIDA Mana MD [facility] Pager [phone]  10/22/2024, 9:04 AM        [1]  Allergies Allergen Reactions    Amlodipine Swelling   "

## 2024-10-22 NOTE — ED Triage Notes (Addendum)
 Pt reports lower abd pain that began a few days ago, pt also reports constipation for 3 days, no relief at home with miralalx. Pt reports hx PD. Pt does not make urine

## 2024-10-22 NOTE — Consult Note (Addendum)
 " [facility]  SURGICAL CONSULTATION NOTE    HISTORY OF PRESENT ILLNESS (HPI):  24 y.o. female presented to [facility] ED today for evaluation of mid to lower abdominal pain. Patient reports the pain started about 3 days ago. Admits to feeling constipated.  Has not had a bowel movement or experience gas since then.  Denies any nausea or vomiting. Denies any fevers or chills.  Patient has a medical history of systemic lupus erythematosus, end-stage renal disease on HD, heart failure, and hypertension.  Per chart review, patient has HD with recovered ejection fraction. Patient was admitted to [facility] in September 2025 for MSSA bacteremia with the likely source of infection being the dialysis catheter. 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 Course complicated by abdominopelvic hemoperitoneum secondary to spontaneous ovarian artery bleeding which resolved on its own. Currently on Eliquis. Last HD was Saturday and due for her next HD today.   In the ED, patient was overall stable.  BP 122/95, HR 96, RR 16, and a temp of 97.7 F. On exam she presented with localized suprapubic to mid lower abdomen pain, no peritonitis. No leukocytosis indicated on labs. Hemoglobin 11.7. Noted significant electrolyte disturbances in CMP with potassium levels greater than 7.5. Bun 68 and creatinine 11.30. Alkaline phos 157, rest of LFTs and total bili were within normal range.  Lipase elevated to 244.  Lactic acid normal 1.8.  CT of abdomen showed multifocal mildly complex abdominal pelvic fluid concerning for peritonitis (infectious, inflammatory, chemical), malignancy (peritoneal carcinomatosis), or hemorrhagic. They also noted a few mildly prominent right abdominal small bowel loops with air-fluid levels no transition point favoring mild ileus versus enteritis.  Moderate stool noted in colon and rectum.  Surgery is consulted by  physician Dr. Dicky  in this context  for evaluation and management of abdominal pain.   PAST MEDICAL HISTORY (PMH):  Past Medical History:  Diagnosis Date   Hypertension    Lupus    Lupus nephritis (HCC)    Seizure (HCC)      PAST SURGICAL HISTORY (PSH):  Past Surgical History:  Procedure Laterality Date   CAPD INSERTION N/A 08/27/2021   Procedure: LAPAROSCOPIC INSERTION CONTINUOUS AMBULATORY PERITONEAL DIALYSIS  (CAPD) CATHETER;  Surgeon: Jordis Laneta FALCON, MD;  Location: [facility] ORS;  Service: General;  Laterality: N/A;   DIALYSIS/PERMA CATHETER INSERTION N/A 08/23/2021   Procedure: DIALYSIS/PERMA CATHETER INSERTION;  Surgeon: Marea Selinda RAMAN, MD;  Location: [facility] INVASIVE CV LAB;  Service: Cardiovascular;  Laterality: N/A;     MEDICATIONS:  Prior to Admission medications  Medication Sig Start Date End Date Taking? Authorizing Provider  acetaminophen  (TYLENOL ) 500 MG tablet Take 2 tablets (1,000 mg total) by mouth every 6 (six) hours as needed. 03/18/24 03/18/25 Yes Clarine Ozell LABOR, MD  apixaban (ELIQUIS) 2.5 MG TABS tablet Take 2.5 mg by mouth 2 (two) times daily. 10/08/24 01/06/25 Yes [provider]  cetirizine (ZYRTEC) 5 MG tablet Take 5 mg by mouth daily. 09/04/24 09/04/25 Yes [provider]  hydroxychloroquine  (PLAQUENIL ) 200 MG tablet Take 400 mg by mouth daily.   Yes [provider]  lacosamide  (VIMPAT ) 200 MG TABS tablet Take 200 mg by mouth 2 (two) times daily.   Yes [provider]  levETIRAcetam  (KEPPRA ) 500 MG tablet Take 500 mg by mouth daily.   Yes [provider]  polyethylene glycol (MIRALAX  / GLYCOLAX ) 17 g packet Take 17 g by mouth daily as needed for moderate  constipation. 08/28/21  Yes Caleen Qualia, MD  BENLYSTA  200 MG/ML SOAJ Inject 200 mg into the skin once a week. Thursday Patient not taking: Reported on 12/18/2023 01/26/23   [provider]  calcitRIOL  (ROCALTROL ) 0.5 MCG capsule Take 0.5 mcg by mouth daily. Patient not taking: Reported on 12/18/2023 06/30/22    [provider]  calcium  acetate (PHOSLO ) 667 MG capsule Take 667 mg by mouth 3 (three) times daily with meals. Patient not taking: Reported on 12/18/2023    [provider]  carvedilol  (COREG ) 25 MG tablet Take 25 mg by mouth daily. Patient not taking: Reported on 02/04/2023    [provider]  Cholecalciferol (VITAMIN D3) 10 MCG (400 UNIT) tablet Take 400 Units by mouth daily. Patient not taking: Reported on 12/18/2023 01/20/23   [provider]  cloNIDine  (CATAPRES  - DOSED IN MG/24 HR) 0.3 mg/24hr patch Place 1 patch (0.3 mg total) onto the skin once a week. Patient not taking: Reported on 02/04/2023 12/10/20   Kasa, Kurian, MD  hydrOXYzine  (ATARAX ) 25 MG tablet Take 25 mg by mouth 2 (two) times daily as needed for anxiety. Patient not taking: Reported on 12/18/2023 01/30/23   [provider]  isosorbide -hydrALAZINE  (BIDIL ) 20-37.5 MG tablet Take 1 tablet by mouth 3 (three) times daily. Patient not taking: Reported on 02/04/2023 08/28/21   Amin, Sumayya, MD  losartan (COZAAR) 50 MG tablet Take 50 mg by mouth in the morning and at bedtime. Patient not taking: Reported on 10/22/2024 06/22/24   [provider]  methylPREDNISolone  sodium succinate (SOLU-MEDROL ) 125 mg/2 mL injection Inject 1.28 mLs (80 mg total) into the vein daily. Please taper slowly to her home dose Patient not taking: Reported on 02/04/2023 08/28/21   Amin, Sumayya, MD  metoprolol  succinate (TOPROL -XL) 25 MG 24 hr tablet Take 25 mg by mouth daily. 02/03/23 02/03/24  [provider]  multivitamin (RENA-VIT) TABS tablet Take 1 tablet by mouth at bedtime. Patient not taking: Reported on 02/04/2023 08/28/21   Amin, Sumayya, MD  Mycophenolate Sodium (MYCOPHENOLIC ACID) 360 MG TBEC Take 1 tablet by mouth 2 (two) times daily. Patient not taking: Reported on 10/22/2024    [provider]  Nutritional Supplements (FEEDING SUPPLEMENT, NEPRO CARB STEADY,) LIQD Take 237 mLs by mouth  3 (three) times daily between meals. 08/28/21   Amin, Sumayya, MD  pantoprazole  (PROTONIX ) 40 MG tablet Take 1 tablet (40 mg total) by mouth daily. Patient not taking: Reported on 10/22/2024 08/28/21   Amin, Sumayya, MD  pravastatin  (PRAVACHOL ) 20 MG tablet Take 20 mg by mouth daily. Patient not taking: Reported on 02/04/2023    [provider]  promethazine  (PHENERGAN ) 12.5 MG tablet Take 12.5 mg by mouth every 6 (six) hours as needed for nausea or vomiting. Patient not taking: Reported on 12/18/2023 01/11/23   [provider]  sacubitril -valsartan  (ENTRESTO ) 24-26 MG Take 1 tablet by mouth 2 (two) times daily. Patient not taking: Reported on 02/04/2023 08/28/21   Amin, Sumayya, MD  spironolactone  (ALDACTONE ) 25 MG tablet Take 12.5 mg by mouth daily. Patient not taking: Reported on 02/04/2023    [provider]  torsemide (DEMADEX) 20 MG tablet Take 40 mg by mouth daily. Patient not taking: Reported on 02/04/2023    [provider]     ALLERGIES:  Allergies[1]   SOCIAL HISTORY:  Social History   Socioeconomic History   Marital status: Single    Spouse name: Not on file   Number of children: Not on file  Years of education: Not on file   Highest education level: Not on file  Occupational History   Not on file  Tobacco Use   Smoking status: Never   Smokeless tobacco: Never  Substance and Sexual Activity   Alcohol use: Never   Drug use: Never   Sexual activity: Yes    Birth control/protection: OCP  Other Topics Concern   Not on file  Social History Narrative   Not on file   Social Drivers of Health   Tobacco Use: Low Risk (10/22/2024)   Patient History    Smoking Tobacco Use: Never    Smokeless Tobacco Use: Never    Passive Exposure: Not on file  Financial Resource Strain: Low Risk (06/05/2024)   Received from [facility]   Overall Financial Resource Strain (CARDIA)    How hard is it for you to pay for the very basics like food, housing,  medical care, and heating?: Not hard at all  Food Insecurity: No Food Insecurity (06/05/2024)   Received from [facility]   Epic    Within the past 12 months, you worried that your food would run out before you got the money to buy more.: Never true    Within the past 12 months, the food you bought just didn't last and you didn't have money to get more.: Never true  Transportation Needs: No Transportation Needs (06/05/2024)   Received from [facility] - Transportation    Lack of Transportation (Medical): No    Lack of Transportation (Non-Medical): No  Physical Activity: Sufficiently Active (06/26/2024)   Received from [facility]   Exercise Vital Sign    On average, how many days per week do you engage in moderate to strenuous exercise (like a brisk walk)?: 7 days    On average, how many minutes do you engage in exercise at this level?: 60 min  Stress: No Stress Concern Present (06/26/2024)   Received from [facility] - Occupational Stress Questionnaire    Do you feel stress - tense, restless, nervous, or anxious, or unable to sleep at night because your mind is troubled all the time - these days?: Not at all  Social Connections: Moderately Integrated (06/26/2024)   Received from [facility]   Social Connection and Isolation Panel    In a typical week, how many times do you talk on the phone with family, friends, or neighbors?: More than three times a week    How often do you get together with friends or relatives?: More than three times a week    How often do you attend church or religious services?: More than 4 times per year    Do you belong to any clubs or organizations such as church groups, unions, fraternal or athletic groups, or school groups?: No    How often do you attend meetings of the clubs or organizations you belong to?: More than 4 times per year    Are you married, widowed, divorced, separated, never  married, or living with a partner?: Never married  Intimate Partner Violence: Patient Unable To Answer (09/04/2024)   Received from [facility]   Epic    Within the last year, have you been afraid of your partner or ex-partner?: Patient unable to answer    Within the last year, have you been humiliated or emotionally abused in other ways by your partner or ex-partner?: Patient unable  to answer    Within the last year, have you been kicked, hit, slapped, or otherwise physically hurt by your partner or ex-partner?: Patient unable to answer    Within the last year, have you been raped or forced to have any kind of sexual activity by your partner or ex-partner?: Patient unable to answer  Depression (PHQ2-9): Not on file  Alcohol Screen: Not on file  Housing: Low Risk (06/30/2023)   Received from [facility]   Epic    What is your living situation today?: I have a steady place to live    Think about the place you live. Do you have problems with any of the following? Choose all that apply:: None/None on this list  Utilities: Low Risk (06/05/2024)   Received from [facility]   Utilities    Within the past 12 months, have you been unable to get utilities(heat, electricity) when it was really needed?: No  Health Literacy: Low Risk (06/26/2024)   Received from [facility] Literacy    How often do you need to have someone help you when you read instructions, pamphlets, or other written material from your doctor or pharmacy?: Never     FAMILY HISTORY:  Family History  Problem Relation Age of Onset   Hyperlipidemia Maternal Grandmother        a. many medical problems, unknown per prior note      REVIEW OF SYSTEMS:  Review of Systems  Constitutional:  Negative for chills and fever.  Respiratory:  Negative for cough and wheezing.   Cardiovascular:  Negative for chest pain and palpitations.  Gastrointestinal:  Positive for abdominal pain and constipation. Negative for nausea  and vomiting.    VITAL SIGNS:  Temp:  [97.7 F (36.5 C)-97.9 F (36.6 C)] 97.8 F (36.6 C) (01/27 1140) Pulse Rate:  [73-110] 78 (01/27 1140) Resp:  [10-25] 18 (01/27 1140) BP: (106-133)/(80-96) 118/80 (01/27 1140) SpO2:  [96 %-100 %] 100 % (01/27 1140) Weight:  [55.5 kg-58.1 kg] 55.5 kg (01/27 1140)     Height: 5' 8 (172.7 cm) Weight: 55.5 kg BMI (Calculated): 18.61   INTAKE/OUTPUT:  No intake/output data recorded.  PHYSICAL EXAM:  Physical Exam Constitutional:      Appearance: She is well-developed.  HENT:     Head: Normocephalic and atraumatic.  Cardiovascular:     Rate and Rhythm: Normal rate and regular rhythm.  Pulmonary:     Effort: Pulmonary effort is normal.     Breath sounds: Normal breath sounds.  Abdominal:     General: Abdomen is flat. A surgical scar is present.     Palpations: Abdomen is soft.     Tenderness: There is abdominal tenderness.  Neurological:     Mental Status: She is alert.      Labs:     Latest Ref Rng & Units 10/22/2024    6:04 AM 10/22/2024    2:18 AM 09/21/2024    7:32 AM  CBC  WBC 4.0 - 10.5 K/uL 7.0  7.3  6.2   Hemoglobin 12.0 - 15.0 g/dL 89.5  88.2  88.9   Hematocrit 36.0 - 46.0 % 34.3  38.4  38.3   Platelets 150 - 400 K/uL 214  232  218       Latest Ref Rng & Units 10/22/2024    5:57 AM 10/22/2024    3:38 AM 10/22/2024    2:18 AM  CMP  Glucose 70 - 99 mg/dL  88  97   BUN 6 - 20 mg/dL  69  68   Creatinine 9.55 - 1.00 mg/dL  88.69  88.69   Sodium 135 - 145 mmol/L  133  133   Potassium 3.5 - 5.1 mmol/L 5.4  >7.5  >7.5   Chloride 98 - 111 mmol/L  90  90   CO2 22 - 32 mmol/L  25  25   Calcium  8.9 - 10.3 mg/dL  8.8  8.9   Total Protein 6.5 - 8.1 g/dL   8.5   Total Bilirubin 0.0 - 1.2 mg/dL   0.3   Alkaline Phos 38 - 126 U/L   157   AST 15 - 41 U/L   19   ALT 0 - 44 U/L   <5     Imaging studies:  EXAM: CT ABDOMEN AND PELVIS WITH CONTRAST 10/22/2024 06:42:40 AM   TECHNIQUE: CT of the abdomen and pelvis was performed  with the administration of intravenous contrast. Multiplanar reformatted images are provided for review. Automated exposure control, iterative reconstruction, and/or weight-based adjustment of the mA/kV was utilized to reduce the radiation dose to as low as reasonably achievable.   COMPARISON: 02/04/2023   CLINICAL HISTORY: Abdominal pain, acute, nonlocalized; CT imaging with contrast IV now (Dr. Joan, radiology aware). Abdominal pain, acute, nonlocalized.   FINDINGS:   LOWER CHEST: No acute abnormality.   LIVER: The liver is unremarkable.   GALLBLADDER AND BILE DUCTS: Gallbladder is unremarkable. No biliary ductal dilatation.   SPLEEN: The spleen is within normal limits in size and appearance.   PANCREAS: The pancreas is short.   ADRENAL GLANDS: Normal size and morphology bilaterally. No nodule, thickening, or hemorrhage. No periadrenal stranding.   KIDNEYS, URETERS AND BLADDER: Bilateral renal cortical volume loss, similar to previous exam. No stones in the kidneys or ureters. No hydronephrosis. No perinephric or periureteral stranding. Urinary bladder is decompressed.   GI AND BOWEL: Stomach appears normal. Within the right hemiabdomen there are a few prominent loops of small bowel which are nonpathologically increased in caliber, measuring up to 2.1 cm in diameter with air-fluid level. No significant bowel wall thickening, inflammation, or distention of the colon. Moderate retained stool identified within the colon and rectum.   PERITONEUM AND RETROPERITONEUM: Multifocal areas of mildly complex fluid identified within the bilateral abdomen and pelvis with signs of early loculation including mild thickening and enhancement of the overlying peritoneal reflections. The fluid measures 23 Hounsfield units along the right paracolic gutter. In the right hemipelvis, this measures 19 Hounsfield units. No free air.   VASCULATURE: Vascular structures of the abdomen  appear patent. Aorta is normal in caliber.   LYMPH NODES: No lymphadenopathy.   REPRODUCTIVE ORGANS: The uterus appears normal. Measures 1.7 cm, image 64/2.   BONES AND SOFT TISSUES: No acute osseous abnormality. No focal soft tissue abnormality.   IMPRESSION: 1. Multifocal mildly complex abdominopelvic fluid with early loculation and mild peritoneal thickening/enhancement. Differential diagnosis for the complex fluid includes peritonitis (infectious, inflammatory, chemical), malignancy (peritoneal carcinomatosis), pancreatitis, ruptured ectopic pregnancy, or ruptured ovarian cyst. Given the Hounsfield units, the fluid is likely exudative or hemorrhagic. Clinical correlation is recommended. Consider diagnostic paracentesis for fluid analysis. 2. Few mildly prominent right abdominal small-bowel loops with air-fluid levels without a discrete transition point, favored mild ileus/enteritis rather than obstruction. 3. Moderate retained stool in the colon and rectum.   Electronically signed by: Waddell Joan MD 10/22/2024 07:01 AM EST RP Workstation: [id number]     Assessment/Plan: 24 y.o. female  with mid to lower abdomen, complicated by pertinent comorbidities including SLE with ESRD on hemodialysis, hyperkalemia, history of chronic pericardial effusions, history of congestive heart failure, and hypertension.    - Stable vital signs, no fever and not tachycardic. No leukocytosis indicated on labs with Hbg levels of 10.4. Normal lactic acid levels 1.8. On exam, abdominal pain was minimal, no signs of acute abdomen. CT findings of complex abdominopelvic fluid collection is likely the cause of lower to mid abdominal pain. Differential diagnosis for fluid collection includes inflammatory vs infectious vs chemical vs malignancy vs hemorrhagic.  Patient has a history of abdominopelvic hemoperitoneum secondary to spontaneous ovarian artery bleeding in September 2025. She's currently on Eliquis.    - Discussed case with Dr. Tye.  He recommends to consult IR to see if fluid collection is amenable for percutaneous drain or if paracentesis is necessary to obtain fluid sample for further testing and treat appropriately. Discussed with hospitalist. Follow hospitalist recommendations.  - Expect mild ileus/enteritis to resolve after managing fluid collection. NGT not necessary since patient denies any nausea or vomiting. Recommend laxatives for retained stool. Surgical intervention is not necessary at this time.   Thank you for the opportunity to participate in this patient's care.   -- Donivin Wirt Barrientos PA-C      [1]  Allergies Allergen Reactions   Amlodipine Swelling   "

## 2024-10-22 NOTE — ED Notes (Addendum)
 Lab reports K+ >7.5; acuity level changed; pt taken to room 14 and placed on card monitor for EKG and continuous monitoring; report to care nurse T Billy RN and Dr Dicky

## 2024-10-22 NOTE — ED Provider Notes (Signed)
 "  [facility] Provider Note    Event Date/Time   First MD Initiated Contact with Patient 10/22/24 0310     (approximate)   History   Abdominal Pain   HPI  Melinda Steele is a 24 y.o. female history of lupus, respiratory failure, heart failure, pericardial effusions and end-stage renal disease Reviewed external records from [facility] cardiology January 15.  Recovered ejection fraction.  Right atrial thrombus.  Also had a bleeding event while she was hospitalized (ovarian artery with hemoperitoneum which self resolved) and on apixaban   Patient reports 3 days ago started feeling constipated.  Ports not able to have a bowel movement she has been having a pain developing in the mid to lower abdomen.  She feels like she needs to have a bowel movement but cannot.  There is no rectal pain.  She reports she is not passing any gas for the last 3 days either.  She still eating and drinking okay though.  She reports the pain is moderate located in the mid lower abdomen.  No vaginal bleeding or symptoms, does not seem to be located deep in the pelvis on 1 side or the other.  Is not sharp it is more like a feeling of fullness  Does not feel anything similar to when she had a problem with her ovary bleeding  Last menstrual cycle ended a couple days ago.  She reports she has never been sexually active  Past Medical History:  Diagnosis Date   Hypertension    Lupus    Lupus nephritis (HCC)    Seizure (HCC)    She is presently on hemodialysis.  Her last hemodialysis was Saturday and she is due today, and has a session planned today  Physical Exam   Triage Vital Signs: ED Triage Vitals  Encounter Vitals Group     BP 10/22/24 0215 (!) 122/95     Girls Systolic BP Percentile --      Girls Diastolic BP Percentile --      Boys Systolic BP Percentile --      Boys Diastolic BP Percentile --      Pulse Rate 10/22/24 0215 96     Resp 10/22/24 0215 16     Temp 10/22/24 0215 97.7 F  (36.5 C)     Temp Source 10/22/24 0215 Oral     SpO2 10/22/24 0215 100 %     Weight 10/22/24 0216 128 lb (58.1 kg)     Height 10/22/24 0216 5' 8 (1.727 m)     Head Circumference --      Peak Flow --      Pain Score 10/22/24 0216 10     Pain Loc --      Pain Education --      Exclude from Growth Chart --     Most recent vital signs: Vitals:   10/22/24 0215 10/22/24 0430  BP: (!) 122/95 (!) 133/93  Pulse: 96 89  Resp: 16 19  Temp: 97.7 F (36.5 C)   SpO2: 100% 99%     General: Awake, no distress.  She has some element of chronic illness but is very pleasant in no distress.  Family at the bedside very pleasant CV:   Good peripheral perfusion.  Resp:   Normal effort. Speaking without distress. Abd:   No distention.  Soft nontender in the upper abdomen bilaterally.  Reports moderate tenderness in the suprapubic region and slight in the left and right lower quadrants bilateral.  There is no frank distention.  There is no peritonitis but the pain does seem to localize in the suprapubic to mid lower abdomen  neuro:   No focal neuro deficits noted. Moves extremities well without noted concern. Other:     ED Results / Procedures / Treatments   Labs (all labs ordered are listed, but only abnormal results are displayed) Labs Reviewed  LIPASE, BLOOD - Abnormal; Notable for the following components:      Result Value   Lipase 244 (*)    All other components within normal limits  COMPREHENSIVE METABOLIC PANEL WITH GFR - Abnormal; Notable for the following components:   Sodium 133 (*)    Potassium >7.5 (*)    Chloride 90 (*)    BUN 68 (*)    Creatinine, Ser 11.30 (*)    Total Protein 8.5 (*)    Alkaline Phosphatase 157 (*)    GFR, Estimated 4 (*)    Anion gap 17 (*)    All other components within normal limits  CBC - Abnormal; Notable for the following components:   Hemoglobin 11.7 (*)    MCH 24.4 (*)    RDW 19.4 (*)    All other components within normal limits  BASIC  METABOLIC PANEL WITH GFR - Abnormal; Notable for the following components:   Sodium 133 (*)    Potassium >7.5 (*)    Chloride 90 (*)    BUN 69 (*)    Creatinine, Ser 11.30 (*)    Calcium  8.8 (*)    GFR, Estimated 4 (*)    Anion gap 17 (*)    All other components within normal limits  MAGNESIUM  - Abnormal; Notable for the following components:   Magnesium  2.6 (*)    All other components within normal limits  CBG MONITORING, ED - Abnormal; Notable for the following components:   Glucose-Capillary 26 (*)    All other components within normal limits  CBG MONITORING, ED - Abnormal; Notable for the following components:   Glucose-Capillary 166 (*)    All other components within normal limits  HCG, QUANTITATIVE, PREGNANCY  POTASSIUM  CBC  PROTIME-INR  APTT  LACTIC ACID, PLASMA  HEPATITIS B SURFACE ANTIGEN  HEPATITIS B SURFACE ANTIBODY, QUANTITATIVE  CBG MONITORING, ED  CBG MONITORING, ED  CBG MONITORING, ED  CBG MONITORING, ED  CBG MONITORING, ED   Labs notable for mildly elevated lipase.  Also notable is a potassium of greater than 7.5 but hemolysis is noted.  The patient also reports being compliant with her normal hemodialysis, and she is planning to attend today.  Her EKG shows no signs of hyperkalemia.  Question if this could be due to hemolysis, we will resend a chemistry  Repeat chemistry demonstrates elevated potassium critical level again  EKG  I independently reviewed the EKG at 3 AM heart rate 90 QRS 120 QTc 440 sinus rhythm, no frank evidence of ischemia.  Slight intraventricular conduction delay.  No findings of obvious hyperkalemia   RADIOLOGY   CT ABDOMEN PELVIS WO CONTRAST Result Date: 10/22/2024 EXAM: CT ABDOMEN AND PELVIS WITHOUT CONTRAST 10/22/2024 05:30:06 AM TECHNIQUE: CT of the abdomen and pelvis was performed without the administration of intravenous contrast. Multiplanar reformatted images are provided for review. Automated exposure control, iterative  reconstruction, and/or weight-based adjustment of the mA/kV was utilized to reduce the radiation dose to as low as reasonably achievable. COMPARISON: 02/04/2023 CLINICAL HISTORY: Abdominal pain, acute (Ped 0-17y); Patient reports feeling of constipation, reports not passing any  flatus or stool for 3 days. Pain over the mid to lower abdomen. FINDINGS: LOWER CHEST: No acute abnormality. LIVER: The liver is unremarkable. GALLBLADDER AND BILE DUCTS: Gallbladder is unremarkable. No biliary ductal dilatation. SPLEEN: No acute abnormality. PANCREAS: Pancreas divisum anatomy is suspected. ADRENAL GLANDS: No acute abnormality. KIDNEYS, URETERS AND BLADDER: Mild cortical volume loss from both kidneys. No nephrolithiasis or obstructive uropathy identified. Urinary bladder is decompressed. No perinephric or periureteral stranding. GI AND BOWEL: Assessment of bowel pathology is limited due to lack of intravenous and oral contrast material. No pathologic dilatation of the large or small bowel loops. Moderate stool burden noted within the colon. Normal appendix. PERITONEUM AND RETROPERITONEUM: There is a small-to-moderate volume of complex fluid within the abdomen and pelvis which measures 33 Hounsfield units along the right lower quadrant. This measures 32 Hounsfield units along the left paracolic gutter. No free air. VASCULATURE: Aorta is normal in caliber. LYMPH NODES: No lymphadenopathy. REPRODUCTIVE ORGANS: The uterus appears normal. Suboptimal visualization of the adnexal structures, separate from the unopacified pelvic bowel loops and complex fluid. BONES AND SOFT TISSUES: Previous peritoneal dialysis catheter has been removed. No acute osseous abnormality. No focal soft tissue abnormality. IMPRESSION: 1. Small-to-moderate volume complex abdominopelvic free fluid. The complex nature of the fluid (Hounsfield units 32-33) suggests possibilities such as exudative ascites (due to inflammation, infection, or malignancy),  hemorrhagic ascites (due to trauma, ruptured ectopic pregnancy, or other bleeding source), or chylous ascites. Consider further imaging such as a contrast-enhanced CT or MRI for better characterization of the fluid, evaluation of the peritoneum, and assessment for underlying pathology. Surgical consultation is recommended if there is high suspicion for an acute surgical process. 2. No bowel obstruction. Moderate colonic stool burden, consistent with constipation. 3. Normal appendix. 4. These findings were discussed with the ordering provider at the time of interpretation on 10/22/2024. I personally spoke with Dr. Dicky, who acknowledged these findings. Electronically signed by: Waddell Calk MD 10/22/2024 06:19 AM EST RP Workstation: [id number]       PROCEDURES:  Critical Care performed: Yes, see critical care procedure note(s)  CRITICAL CARE Performed by: Oneil Dicky   Total critical care time: 50 minutes  Critical care time was exclusive of separately billable procedures and treating other patients.  Critical care was necessary to treat or prevent imminent or life-threatening deterioration.  Critical care was time spent personally by me on the following activities: development of treatment plan with patient and/or surrogate as well as nursing, discussions with consultants, evaluation of patient's response to treatment, examination of patient, obtaining history from patient or surrogate, ordering and performing treatments and interventions, ordering and review of laboratory studies, ordering and review of radiographic studies, pulse oximetry and re-evaluation of patient's condition.  Procedures   MEDICATIONS ORDERED IN ED: Medications  dextrose  (D10W) 10% bolus 250 mL (has no administration in time range)  Chlorhexidine  Gluconate Cloth 2 % PADS 6 each (has no administration in time range)  patiromer  (VELTASSA ) packet 25.2 g (has no administration in time range)  morphine  (PF) 4 MG/ML  injection 4 mg (4 mg Intravenous Given 10/22/24 0340)  sodium bicarbonate  injection 50 mEq (50 mEq Intravenous Given 10/22/24 0406)  calcium  gluconate 1 g/ 50 mL sodium chloride  IVPB (0 mg Intravenous Stopped 10/22/24 0511)  calcium  gluconate 1 g/ 50 mL sodium chloride  IVPB (0 mg Intravenous Stopped 10/22/24 0538)  insulin  aspart (novoLOG ) injection 5 Units (5 Units Intravenous Given 10/22/24 0415)  dextrose  50 % solution 50 mL (50 mLs Intravenous Given  10/22/24 0505)  diphenhydrAMINE  (BENADRYL ) injection 25 mg (25 mg Intravenous Given 10/22/24 0558)  iohexol  (OMNIPAQUE ) 300 MG/ML solution 100 mL (100 mLs Intravenous Contrast Given 10/22/24 0626)     IMPRESSION / MDM / ASSESSMENT AND PLAN / ED COURSE  I reviewed the triage vital signs and the nursing notes.                              Based on presentation, the differential diagnosis includes, but is not limited to key considerations: abdominal pain DIFFERENTIAL DIAGNOSIS CONSIDERATIONS: High-acuity etiologies considered and prioritized for rule-out: - Abdominal Aortic Aneurysm (AAA) Rupture/Leak - Mesenteric Ischemia - Perforated Viscus (Hollow Organ Perforation) - Acute Appendicitis - Ruptured Ectopic Pregnancy / Ovarian Torsion / STI / Pelvic Infection - Testicular Torsion (if female) - Bowel Obstruction (Strangulated) - Ascending Cholangitis - Acute Pancreatitis - Cholecystitis - Diverticulitis   For this young lady with significant lupus and past medical history her differential is a little more complicated as well.  Reassuring that she is not febrile and her white count is appropriate.  Platelet count is normal and she has a very mild anemia.  Her exam overall shows no peritonitis or signs of obvious intra-abdominal catastrophe.  hCG negative (ESRD < 2)  I suspect likely gastrointestinal in nature given the exam the history feeling of constipation, etc.  However given her age and medical history we will proceed with CT imaging.  Will  perform this with oral contrast only, she is also a hemodialysis patient she also has difficult IV access and my pretest probability for an acute vascular cause at this point is quite low.  Patient's presentation is most consistent with acute complicated illness / injury requiring diagnostic workup.    The patient is on the cardiac monitor to evaluate for evidence of arrhythmia and/or significant heart rate changes.  Clinical Course as of 10/22/24 0657  Tue Oct 22, 2024  9646 Pending repeat potassium. Patient ntoed to have 1-2 min run of wide complex tachycardia. No symptoms, but will start bicarb and calcium  at this time. Await pending repeat chemistry for k. [MQ]  0405 Have patient nephrology.  Medications ordered for potential hyperkalemia.  Pending repeat chemistry.  Did discuss with the patient and she does report she felt her heart racing but she also reports she was crying at the time.  She does not know of any history of SVT or V. tach in the past.  At this juncture given the concern of hyperkalemia I am treating we will await further. [MQ]  0411 Dr. Marcelino suggest against Kayexalate or Veltassa  until we know the patient does not have a bowel obstruction, I am in agreement.  If CT imaging shows no obstruction he recommends move forward with Kayexalate.  In the interim recommends additional calcium  gluconate, 5 units insulin  and dextrose .  Awaiting repeat chemistry.  Patient on cardiac monitor [MQ]  [phone] Stat dialysis being planned by nephrology (Dr. Marcelino) [MQ]  [phone] Glucose 26, patient reports feeling odd, palpitations. Alert, drank 1 cup orange juice withoout issue. D50 ordered IV stat [MQ]  0501 D50 given, 1 amp. Patient alert, drinking juice now without difficulty.  [MQ]  [phone] Reviewed patient's clinical history workup hyperkalemia potential run of wide-complex tachycardia with ICU team nurse practitioner Jenita.  Presents today advising patient to admission to the hospitalist service.  [MQ]  [id number] CT imaging discussed with radiologist Dr. Joan.  He advises there is free  fluid or liquid in the abdomen but hard to quantify.  Reports a history of an globular component.  Possible chronicity.  He recommends repeat imaging with contrast. [MQ]  [phone] Spoke with Dr. Tye.  He advises that his surgical team will follow-up on the patient this morning, they will review the additional contrast-enhanced study.  He advises appropriate to admit the patient with surgery to follow closely as we continue to delineate cause of abdominal pain and findings on CT. [MQ]  0640 Repeat potassium, hemoglobin pending.  No obstruction on CT imaging.  ValTesa given per recommendation of Dr. Marcelino. HD tech on way for emergent HD session now.  Pending surgery consult.  CT imaging with contrast has been completed pending read and surgical consult.  Given the patient is actively anticoagulated, has severe hyperkalemia etc. and does not appear that she would be an immediate surgical candidate.  But further elucidation as to cause is needed.  Differential diagnosis quite broad [MQ]  0641 I have consulted with patient accepted to hospital service by Dr. Lawence  [MQ]  [phone] Patient resting reports abdominal pain started has come back.  Is been several hours since morphine  and she reports that that was quite helpful.  Will give additional dose now.  Both she and her mother are understanding of plan for admission.  They are agreeable.  They understand surgery consult, hemodialysis, and internal medicine evaluation are all pending. [MQ]    Clinical Course User Index [MQ] Dicky Anes, MD     FINAL CLINICAL IMPRESSION(S) / ED DIAGNOSES   Final diagnoses:  Wide-complex tachycardia  Suprapubic pain  Hyperkalemia  Free fluid in pelvis     Rx / DC Orders   ED Discharge Orders     None        Note:  This document was prepared using Dragon voice recognition software and may include unintentional dictation  errors.   Dicky Anes, MD 10/22/24 [phone]  "

## 2024-10-22 NOTE — Progress Notes (Signed)
" °   10/22/24 1140  Vitals  Temp 97.8 F (36.6 C)  BP 118/80  Pulse Rate 78  Resp 18  Weight 55.5 kg  Type of Weight Post-Dialysis  Oxygen Therapy  SpO2 100 %  O2 Device Room Air  Patient Activity (if Appropriate) In bed  Pulse Oximetry Type Continuous  Oximetry Probe Site Changed No  Post Treatment  Dialyzer Clearance Clear  Hemodialysis Intake (mL) 0 mL  Liters Processed 68.3  Fluid Removed (mL) 600 mL  Tolerated HD Treatment Yes  Post-Hemodialysis Comments Tx has been tolerated. Vs have remained stable. Pain has resolved. Dressing changed. CVC is clean, dry and intact. No verbalized concerns. Patient is stable to return to ED  AVG/AVF Arterial Site Held (minutes) 0 minutes  AVG/AVF Venous Site Held (minutes) 0 minutes    "

## 2024-10-22 NOTE — Progress Notes (Signed)
 Pt receives outpt HD at  [facility] on [address] at [place]. Navigator following to assist with any HD needs.  Suzen Satchel Dialysis Navigator [phone]

## 2024-10-22 NOTE — Progress Notes (Signed)
 " [facility]  ROUNDING NOTE   Subjective:   Melinda Steele is a 24 y.o. female with with hypertension, lupus, history of pericardial tamponade, congestive heart failure, seizures and end-stage renal disease on hemodialysis. Patient presents to ED with abd pain and has been admitted for Hyperkalemia [E87.5] Wide-complex tachycardia [R00.0] Suprapubic pain [R10.24] Free fluid in pelvis [R18.8]  Patient is known to our practice and receives outpatient dialysis treatments at [facility] on a TTS schedule, supervised by Dr. Tobie.  They report receiving a full treatment on Saturday.  Patient presents today with abdominal pain she has been experiencing for the past few days.  She also reports being constipated recently.  No relief of those symptoms with over-the-counter medications.  She is seen and evaluated during dialysis.   HEMODIALYSIS FLOWSHEET:  Blood Flow Rate (mL/min): 399 mL/min Arterial Pressure (mmHg): -179.99 mmHg Venous Pressure (mmHg): 181.61 mmHg TMP (mmHg): -1.41 mmHg Ultrafiltration Rate (mL/min): 77 mL/min Dialysate Flow Rate (mL/min): 299 ml/min  Labs on ED arrival concerning for sodium 133, potassium greater than 7.5, BUN 69, creatinine 11.3 with GFR 4 and hemoglobin 11.7.  CT abdomen pelvis negative for bowel obstruction, just shows small to moderate volume complex abdominal pelvic free fluid, questionable ascites.  We have been consulted to manage dialysis needs.   Objective:  Vital signs in last 24 hours:  Temp:  [97.7 F (36.5 C)-98.6 F (37 C)] 98.6 F (37 C) (01/27 1548) Pulse Rate:  [73-110] 107 (01/27 1548) Resp:  [10-25] 18 (01/27 1548) BP: (106-157)/(80-96) 116/86 (01/27 1548) SpO2:  [96 %-100 %] 100 % (01/27 1548) Weight:  [55.5 kg-58.1 kg] 55.5 kg (01/27 1140)  Weight change:  Filed Weights   10/22/24 0216 10/22/24 0730 10/22/24 1140  Weight: 58.1 kg 56 kg 55.5 kg    Intake/Output: No intake/output data recorded.    Intake/Output this shift:  Total I/O In: 237 [P.O.:237] Out: 600 [Other:600]  Physical Exam: General: NAD  Head: Normocephalic, atraumatic. Moist oral mucosal membranes  Eyes: Anicteric  Neck: Supple  Lungs:  Clear to auscultation, normal effort  Heart: Regular rate and rhythm  Abdomen:  Soft, nontender  Extremities: Trace peripheral edema.  Neurologic: Awake, alert, conversant  Skin: Warm,dry, no rash  Access: Right IJ PermCath    Basic Metabolic Panel: Recent Labs  Lab 10/22/24 0218 10/22/24 0338 10/22/24 0557  NA 133* 133*  --   K >7.5* >7.5* 5.4*  CL 90* 90*  --   CO2 25 25  --   GLUCOSE 97 88  --   BUN 68* 69*  --   CREATININE 11.30* 11.30*  --   CALCIUM  8.9 8.8*  --   MG 2.6*  --   --     Liver Function Tests: Recent Labs  Lab 10/22/24 0218  AST 19  ALT <5  ALKPHOS 157*  BILITOT 0.3  PROT 8.5*  ALBUMIN 4.0   Recent Labs  Lab 10/22/24 0218  LIPASE 244*   No results for input(s): AMMONIA in the last 168 hours.  CBC: Recent Labs  Lab 10/22/24 0218 10/22/24 0604  WBC 7.3 7.0  HGB 11.7* 10.4*  HCT 38.4 34.3*  MCV 80.0 80.9  PLT 232 214    Cardiac Enzymes: No results for input(s): CKTOTAL, CKMB, CKMBINDEX, TROPONINI in the last 168 hours.  BNP: Invalid input(s): POCBNP  CBG: Recent Labs  Lab 10/22/24 0456 10/22/24 0516  GLUCAP 26* 166*    Microbiology: Results for orders placed or performed during the hospital  encounter of 02/04/23  Peritoneal fluid culture w Gram Stain     Status: None   Collection Time: 02/04/23 10:19 AM   Specimen: Peritoneal Washings; Peritoneal Fluid  Result Value Ref Range Status   Specimen Description   Final    PERITONEAL Performed at [facility], [address], [place], [place] [address]    Special Requests   Final    NONE Performed at [facility], [address] [place], [place] [address]    Gram Stain   Final    RARE WBC PRESENT, PREDOMINANTLY  MONONUCLEAR RARE GRAM NEGATIVE RODS Performed at [facility], [address] [place], [place] [address]    Culture MODERATE CITROBACTER BRAAKII  Final   Report Status 02/07/2023 FINAL  Final   Organism ID, Bacteria CITROBACTER BRAAKII  Final      Susceptibility   Citrobacter braakii - MIC*    CEFEPIME  <=0.12 SENSITIVE Sensitive     CEFTAZIDIME <=1 SENSITIVE Sensitive     CEFTRIAXONE  <=0.25 SENSITIVE Sensitive     CIPROFLOXACIN <=0.25 SENSITIVE Sensitive     GENTAMICIN  <=1 SENSITIVE Sensitive     IMIPENEM 2 SENSITIVE Sensitive     TRIMETH/SULFA <=20 SENSITIVE Sensitive     PIP/TAZO <=4 SENSITIVE Sensitive     * MODERATE CITROBACTER BRAAKII  Culture, blood (Routine X 2) w Reflex to ID Panel     Status: None   Collection Time: 02/04/23  9:13 PM   Specimen: BLOOD  Result Value Ref Range Status   Specimen Description BLOOD BLOOD RIGHT ARM  Final   Special Requests   Final    BOTTLES DRAWN AEROBIC ONLY Blood Culture adequate volume   Culture   Final    NO GROWTH 5 DAYS Performed at [facility], [address] [place], [place] [address]    Report Status 02/09/2023 FINAL  Final  Culture, blood (Routine X 2) w Reflex to ID Panel     Status: None   Collection Time: 02/04/23  9:23 PM   Specimen: BLOOD  Result Value Ref Range Status   Specimen Description BLOOD BLOOD LEFT ARM  Final   Special Requests   Final    BOTTLES DRAWN AEROBIC AND ANAEROBIC Blood Culture adequate volume   Culture   Final    NO GROWTH 5 DAYS Performed at [facility], [address], [place], [place] [address]    Report Status 02/09/2023 FINAL  Final    Coagulation Studies: Recent Labs    10/22/24 0604  LABPROT 14.8  INR 1.1    Urinalysis: No results for input(s): COLORURINE, LABSPEC, PHURINE, GLUCOSEU, HGBUR, BILIRUBINUR, KETONESUR, PROTEINUR, UROBILINOGEN, NITRITE, LEUKOCYTESUR in the last 72 hours.  Invalid input(s): APPERANCEUR     Imaging: CT ABDOMEN PELVIS W CONTRAST Result Date: 10/22/2024 EXAM: CT ABDOMEN AND PELVIS WITH CONTRAST 10/22/2024 06:42:40 AM TECHNIQUE: CT of the abdomen and pelvis was performed with the administration of intravenous contrast. Multiplanar reformatted images are provided for review. Automated exposure control, iterative reconstruction, and/or weight-based adjustment of the mA/kV was utilized to reduce the radiation dose to as low as reasonably achievable. COMPARISON: 02/04/2023 CLINICAL HISTORY: Abdominal pain, acute, nonlocalized; CT imaging with contrast IV now (Dr. Joan, radiology aware). Abdominal pain, acute, nonlocalized. FINDINGS: LOWER CHEST: No acute abnormality. LIVER: The liver is unremarkable. GALLBLADDER AND BILE DUCTS: Gallbladder is unremarkable. No biliary ductal dilatation. SPLEEN: The spleen is within normal limits in size and appearance. PANCREAS: The pancreas is short. ADRENAL GLANDS: Normal size  and morphology bilaterally. No nodule, thickening, or hemorrhage. No periadrenal stranding. KIDNEYS, URETERS AND BLADDER: Bilateral renal cortical volume loss, similar to previous exam. No stones in the kidneys or ureters. No hydronephrosis. No perinephric or periureteral stranding. Urinary bladder is decompressed. GI AND BOWEL: Stomach appears normal. Within the right hemiabdomen there are a few prominent loops of small bowel which are nonpathologically increased in caliber, measuring up to 2.1 cm in diameter with air-fluid level. No significant bowel wall thickening, inflammation, or distention of the colon. Moderate retained stool identified within the colon and rectum. PERITONEUM AND RETROPERITONEUM: Multifocal areas of mildly complex fluid identified within the bilateral abdomen and pelvis with signs of early loculation including mild thickening and enhancement of the overlying peritoneal reflections. The fluid measures 23 Hounsfield units along the right paracolic gutter. In the right  hemipelvis, this measures 19 Hounsfield units. No free air. VASCULATURE: Vascular structures of the abdomen appear patent. Aorta is normal in caliber. LYMPH NODES: No lymphadenopathy. REPRODUCTIVE ORGANS: The uterus appears normal. Measures 1.7 cm, image 64/2. BONES AND SOFT TISSUES: No acute osseous abnormality. No focal soft tissue abnormality. IMPRESSION: 1. Multifocal mildly complex abdominopelvic fluid with early loculation and mild peritoneal thickening/enhancement. Differential diagnosis for the complex fluid includes peritonitis (infectious, inflammatory, chemical), malignancy (peritoneal carcinomatosis), pancreatitis, ruptured ectopic pregnancy, or ruptured ovarian cyst. Given the Hounsfield units, the fluid is likely exudative or hemorrhagic. Clinical correlation is recommended. Consider diagnostic paracentesis for fluid analysis. 2. Few mildly prominent right abdominal small-bowel loops with air-fluid levels without a discrete transition point, favored mild ileus/enteritis rather than obstruction. 3. Moderate retained stool in the colon and rectum. Electronically signed by: Waddell Calk MD 10/22/2024 07:01 AM EST RP Workstation: [id number]   CT ABDOMEN PELVIS WO CONTRAST Result Date: 10/22/2024 EXAM: CT ABDOMEN AND PELVIS WITHOUT CONTRAST 10/22/2024 05:30:06 AM TECHNIQUE: CT of the abdomen and pelvis was performed without the administration of intravenous contrast. Multiplanar reformatted images are provided for review. Automated exposure control, iterative reconstruction, and/or weight-based adjustment of the mA/kV was utilized to reduce the radiation dose to as low as reasonably achievable. COMPARISON: 02/04/2023 CLINICAL HISTORY: Abdominal pain, acute (Ped 0-17y); Patient reports feeling of constipation, reports not passing any flatus or stool for 3 days. Pain over the mid to lower abdomen. FINDINGS: LOWER CHEST: No acute abnormality. LIVER: The liver is unremarkable. GALLBLADDER AND BILE DUCTS:  Gallbladder is unremarkable. No biliary ductal dilatation. SPLEEN: No acute abnormality. PANCREAS: Pancreas divisum anatomy is suspected. ADRENAL GLANDS: No acute abnormality. KIDNEYS, URETERS AND BLADDER: Mild cortical volume loss from both kidneys. No nephrolithiasis or obstructive uropathy identified. Urinary bladder is decompressed. No perinephric or periureteral stranding. GI AND BOWEL: Assessment of bowel pathology is limited due to lack of intravenous and oral contrast material. No pathologic dilatation of the large or small bowel loops. Moderate stool burden noted within the colon. Normal appendix. PERITONEUM AND RETROPERITONEUM: There is a small-to-moderate volume of complex fluid within the abdomen and pelvis which measures 33 Hounsfield units along the right lower quadrant. This measures 32 Hounsfield units along the left paracolic gutter. No free air. VASCULATURE: Aorta is normal in caliber. LYMPH NODES: No lymphadenopathy. REPRODUCTIVE ORGANS: The uterus appears normal. Suboptimal visualization of the adnexal structures, separate from the unopacified pelvic bowel loops and complex fluid. BONES AND SOFT TISSUES: Previous peritoneal dialysis catheter has been removed. No acute osseous abnormality. No focal soft tissue abnormality. IMPRESSION: 1. Small-to-moderate volume complex abdominopelvic free fluid. The complex nature of the fluid (Hounsfield units 32-33)  suggests possibilities such as exudative ascites (due to inflammation, infection, or malignancy), hemorrhagic ascites (due to trauma, ruptured ectopic pregnancy, or other bleeding source), or chylous ascites. Consider further imaging such as a contrast-enhanced CT or MRI for better characterization of the fluid, evaluation of the peritoneum, and assessment for underlying pathology. Surgical consultation is recommended if there is high suspicion for an acute surgical process. 2. No bowel obstruction. Moderate colonic stool burden, consistent with  constipation. 3. Normal appendix. 4. These findings were discussed with the ordering provider at the time of interpretation on 10/22/2024. I personally spoke with Dr. Dicky, who acknowledged these findings. Electronically signed by: Waddell Calk MD 10/22/2024 06:19 AM EST RP Workstation: [id number]     Medications:    dextrose       Chlorhexidine  Gluconate Cloth  6 each Topical Q0600   feeding supplement (NEPRO CARB STEADY)  237 mL Oral TID BM   heparin   5,000 Units Subcutaneous Q12H   [START ON 10/23/2024] hydroxychloroquine   200 mg Oral Daily   lacosamide   200 mg Oral BID   levETIRAcetam   500 mg Oral Daily   loratadine   10 mg Oral Daily   acetaminophen , HYDROmorphone  (DILAUDID ) injection, ondansetron  **OR** ondansetron  (ZOFRAN ) IV, polyethylene glycol, senna-docusate, zolpidem   Assessment/ Plan:  Melinda Steele is a 24 y.o.  female with hypertension, lupus, history of pericardial tamponade, congestive heart failure, seizures and end-stage renal disease on hemodialysis   End-stage renal disease with hyperkalemia on hemodialysis.  Last treatment completed on Saturday.  Potassium greater than 7.5 on ED arrival.  Patient receiving urgent dialysis treatment on 1K bath for management of potassium.  Will reevaluate potassium levels, may require additional dialysis tomorrow.  UF 600 mL achieved during treatment earlier.  2.  Acute on chronic lower abdominal pain, CT abdomen pelvis showed mild complex abdominal pelvic fluid collection, infectious inflammatory chemical versus bleeding.  Surgery has been consulted.  Negative for bowel obstruction.  3. Anemia of chronic kidney disease Lab Results  Component Value Date   HGB 10.4 (L) 10/22/2024    Hemoglobin within optimal range, no need for ESA's.  4. Secondary Hyperparathyroidism: with outpatient labs: None  Lab Results  Component Value Date   PTH 181 (H) 08/23/2021   CALCIUM  8.8 (L) 10/22/2024   PHOS 6.2 (H) 09/21/2024    Will monitor  bone minerals during this admission.   LOS: 0 Melinda Steele 1/27/20265:05 PM   "

## 2024-10-23 ENCOUNTER — Inpatient Hospital Stay

## 2024-10-23 DIAGNOSIS — R188 Other ascites: Secondary | ICD-10-CM

## 2024-10-23 LAB — BODY FLUID CELL COUNT WITH DIFFERENTIAL
Eos, Fluid: 0 %
Lymphs, Fluid: 0 %
Monocyte-Macrophage-Serous Fluid: 25 % — ABNORMAL LOW (ref 50–90)
Neutrophil Count, Fluid: 75 % — ABNORMAL HIGH (ref 0–25)
Total Nucleated Cell Count, Fluid: 5189 uL — ABNORMAL HIGH (ref 0–1000)

## 2024-10-23 LAB — BASIC METABOLIC PANEL WITH GFR
Anion gap: 14 (ref 5–15)
BUN: 30 mg/dL — ABNORMAL HIGH (ref 6–20)
CO2: 30 mmol/L (ref 22–32)
Calcium: 8.6 mg/dL — ABNORMAL LOW (ref 8.9–10.3)
Chloride: 91 mmol/L — ABNORMAL LOW (ref 98–111)
Creatinine, Ser: 6.87 mg/dL — ABNORMAL HIGH (ref 0.44–1.00)
GFR, Estimated: 8 mL/min — ABNORMAL LOW
Glucose, Bld: 83 mg/dL (ref 70–99)
Potassium: 5.2 mmol/L — ABNORMAL HIGH (ref 3.5–5.1)
Sodium: 135 mmol/L (ref 135–145)

## 2024-10-23 LAB — LIPASE, BLOOD: Lipase: 57 U/L — ABNORMAL HIGH (ref 11–51)

## 2024-10-23 LAB — PATHOLOGIST SMEAR REVIEW

## 2024-10-23 LAB — HEPATITIS B SURFACE ANTIGEN: Hepatitis B Surface Ag: NONREACTIVE

## 2024-10-23 MED ORDER — BISACODYL 5 MG PO TBEC: 10.0000 mg | DELAYED_RELEASE_TABLET | Freq: Every day | ORAL | Status: DC

## 2024-10-23 MED ORDER — POLYETHYLENE GLYCOL 3350 17 G PO PACK: 17.0000 g | PACK | Freq: Two times a day (BID) | ORAL | Status: DC

## 2024-10-23 MED ORDER — ACETAMINOPHEN 325 MG PO TABS: 650.0000 mg | ORAL_TABLET | Freq: Four times a day (QID) | ORAL | Status: DC | PRN

## 2024-10-23 MED ORDER — SENNOSIDES-DOCUSATE SODIUM 8.6-50 MG PO TABS: 1.0000 | ORAL_TABLET | Freq: Two times a day (BID) | ORAL | Status: DC

## 2024-10-23 MED ORDER — SODIUM ZIRCONIUM CYCLOSILICATE 10 G PO PACK
10.0000 g | PACK | Freq: Once | ORAL | Status: AC
  Administered 2024-10-23: 10 g via ORAL
  Filled 2024-10-23: qty 1

## 2024-10-23 MED ORDER — HYDROMORPHONE HCL 1 MG/ML IJ SOLN
0.5000 mg | INTRAMUSCULAR | Status: DC | PRN
  Administered 2024-10-23: 1 mg via INTRAVENOUS
  Filled 2024-10-23: qty 1

## 2024-10-23 MED ORDER — LIDOCAINE HCL (PF) 1 % IJ SOLN
10.0000 mL | Freq: Once | INTRAMUSCULAR | Status: AC
  Administered 2024-10-23: 10 mL

## 2024-10-23 MED ORDER — DIPHENHYDRAMINE HCL 25 MG PO CAPS
25.0000 mg | ORAL_CAPSULE | Freq: Four times a day (QID) | ORAL | Status: DC | PRN
  Administered 2024-10-23: 25 mg via ORAL
  Filled 2024-10-23: qty 1

## 2024-10-23 MED ORDER — SENNOSIDES-DOCUSATE SODIUM 8.6-50 MG PO TABS: 1.0000 | ORAL_TABLET | Freq: Every evening | ORAL | Status: DC | PRN

## 2024-10-23 MED ORDER — HYDROCODONE-ACETAMINOPHEN 5-325 MG PO TABS: 2.0000 | ORAL_TABLET | Freq: Four times a day (QID) | ORAL | Status: DC | PRN

## 2024-10-23 MED ORDER — POLYETHYLENE GLYCOL 3350 17 G PO PACK: 17.0000 g | PACK | Freq: Every day | ORAL | Status: DC | PRN

## 2024-10-23 NOTE — Progress Notes (Signed)
 " [facility]  ROUNDING NOTE   Subjective:   Melinda Steele is a 24 y.o. female with with hypertension, lupus, history of pericardial tamponade, congestive heart failure, seizures and end-stage renal disease on hemodialysis. Patient presents to ED with abd pain and has been admitted for Hyperkalemia [E87.5] Wide-complex tachycardia [R00.0] Suprapubic pain [R10.24] Free fluid in pelvis [R18.8]  Patient is known to our practice and receives outpatient dialysis treatments at [facility] on a TTS schedule, supervised by Dr. Tobie.    Update: Patient sitting up in chair Alert No family present Room air    Objective:  Vital signs in last 24 hours:  Temp:  [97.8 F (36.6 C)-98.6 F (37 C)] 98.6 F (37 C) (01/28 0742) Pulse Rate:  [96-107] 97 (01/28 0742) Resp:  [15-18] 17 (01/28 0428) BP: (108-116)/(76-86) 112/78 (01/28 0742) SpO2:  [100 %] 100 % (01/28 0742)  Weight change: -2.06 kg Filed Weights   10/22/24 0216 10/22/24 0730 10/22/24 1140  Weight: 58.1 kg 56 kg 55.5 kg    Intake/Output: I/O last 3 completed shifts: In: 237 [P.O.:237] Out: 600 [Other:600]   Intake/Output this shift:  No intake/output data recorded.  Physical Exam: General: NAD  Head: Normocephalic, atraumatic. Moist oral mucosal membranes  Eyes: Anicteric  Lungs:  Clear to auscultation, normal effort  Heart: Regular rate and rhythm  Abdomen:  Soft, nontender  Extremities: Trace peripheral edema.  Neurologic: Awake, alert, conversant  Skin: Warm,dry, no rash  Access: Right IJ PermCath    Basic Metabolic Panel: Recent Labs  Lab 10/22/24 0218 10/22/24 0338 10/22/24 0557 10/22/24 2321  NA 133* 133*  --  135  133*  K >7.5* >7.5* 5.4* 5.2*  5.1  CL 90* 90*  --  91*  91*  CO2 25 25  --  30  28  GLUCOSE 97 88  --  83  83  BUN 68* 69*  --  30*  30*  CREATININE 11.30* 11.30*  --  6.87*  6.86*  CALCIUM  8.9 8.8*  --  8.6*  8.7*  MG 2.6*  --   --   --   PHOS  --   --    --  5.0*    Liver Function Tests: Recent Labs  Lab 10/22/24 0218 10/22/24 2321  AST 19  --   ALT <5  --   ALKPHOS 157*  --   BILITOT 0.3  --   PROT 8.5*  --   ALBUMIN 4.0 3.8   Recent Labs  Lab 10/22/24 0218 10/22/24 2321  LIPASE 244* 57*   No results for input(s): AMMONIA in the last 168 hours.  CBC: Recent Labs  Lab 10/22/24 0218 10/22/24 0604 10/22/24 2321  WBC 7.3 7.0 5.1  HGB 11.7* 10.4* 10.5*  HCT 38.4 34.3* 35.2*  MCV 80.0 80.9 81.3  PLT 232 214 201    Cardiac Enzymes: No results for input(s): CKTOTAL, CKMB, CKMBINDEX, TROPONINI in the last 168 hours.  BNP: Invalid input(s): POCBNP  CBG: Recent Labs  Lab 10/22/24 0456 10/22/24 0516  GLUCAP 26* 166*    Microbiology: Results for orders placed or performed during the hospital encounter of 02/04/23  Peritoneal fluid culture w Gram Stain     Status: None   Collection Time: 02/04/23 10:19 AM   Specimen: Peritoneal Washings; Peritoneal Fluid  Result Value Ref Range Status   Specimen Description   Final    PERITONEAL Performed at [facility], [address] [place], [place] [address]  Special Requests   Final    NONE Performed at [facility], [address] [place], [place] [address]    Gram Stain   Final    RARE WBC PRESENT, PREDOMINANTLY MONONUCLEAR RARE GRAM NEGATIVE RODS Performed at [facility], [address] [place], [place] [address]    Culture MODERATE CITROBACTER BRAAKII  Final   Report Status 02/07/2023 FINAL  Final   Organism ID, Bacteria CITROBACTER BRAAKII  Final      Susceptibility   Citrobacter braakii - MIC*    CEFEPIME  <=0.12 SENSITIVE Sensitive     CEFTAZIDIME <=1 SENSITIVE Sensitive     CEFTRIAXONE  <=0.25 SENSITIVE Sensitive     CIPROFLOXACIN <=0.25 SENSITIVE Sensitive     GENTAMICIN  <=1 SENSITIVE Sensitive     IMIPENEM 2 SENSITIVE Sensitive     TRIMETH/SULFA <=20 SENSITIVE Sensitive     PIP/TAZO <=4 SENSITIVE Sensitive      * MODERATE CITROBACTER BRAAKII  Culture, blood (Routine X 2) w Reflex to ID Panel     Status: None   Collection Time: 02/04/23  9:13 PM   Specimen: BLOOD  Result Value Ref Range Status   Specimen Description BLOOD BLOOD RIGHT ARM  Final   Special Requests   Final    BOTTLES DRAWN AEROBIC ONLY Blood Culture adequate volume   Culture   Final    NO GROWTH 5 DAYS Performed at [facility], [address] [place], [place] [address]    Report Status 02/09/2023 FINAL  Final  Culture, blood (Routine X 2) w Reflex to ID Panel     Status: None   Collection Time: 02/04/23  9:23 PM   Specimen: BLOOD  Result Value Ref Range Status   Specimen Description BLOOD BLOOD LEFT ARM  Final   Special Requests   Final    BOTTLES DRAWN AEROBIC AND ANAEROBIC Blood Culture adequate volume   Culture   Final    NO GROWTH 5 DAYS Performed at [facility], [address] [place], [place] [address]    Report Status 02/09/2023 FINAL  Final    Coagulation Studies: Recent Labs    10/22/24 0604  LABPROT 14.8  INR 1.1    Urinalysis: No results for input(s): COLORURINE, LABSPEC, PHURINE, GLUCOSEU, HGBUR, BILIRUBINUR, KETONESUR, PROTEINUR, UROBILINOGEN, NITRITE, LEUKOCYTESUR in the last 72 hours.  Invalid input(s): APPERANCEUR    Imaging: CT ABDOMEN PELVIS W CONTRAST Result Date: 10/22/2024 EXAM: CT ABDOMEN AND PELVIS WITH CONTRAST 10/22/2024 06:42:40 AM TECHNIQUE: CT of the abdomen and pelvis was performed with the administration of intravenous contrast. Multiplanar reformatted images are provided for review. Automated exposure control, iterative reconstruction, and/or weight-based adjustment of the mA/kV was utilized to reduce the radiation dose to as low as reasonably achievable. COMPARISON: 02/04/2023 CLINICAL HISTORY: Abdominal pain, acute, nonlocalized; CT imaging with contrast IV now (Dr. Joan, radiology aware). Abdominal pain, acute, nonlocalized.  FINDINGS: LOWER CHEST: No acute abnormality. LIVER: The liver is unremarkable. GALLBLADDER AND BILE DUCTS: Gallbladder is unremarkable. No biliary ductal dilatation. SPLEEN: The spleen is within normal limits in size and appearance. PANCREAS: The pancreas is short. ADRENAL GLANDS: Normal size and morphology bilaterally. No nodule, thickening, or hemorrhage. No periadrenal stranding. KIDNEYS, URETERS AND BLADDER: Bilateral renal cortical volume loss, similar to previous exam. No stones in the kidneys or ureters. No hydronephrosis. No perinephric or periureteral stranding. Urinary bladder is decompressed. GI AND BOWEL: Stomach appears normal. Within the right hemiabdomen there are a few prominent loops of small bowel which  are nonpathologically increased in caliber, measuring up to 2.1 cm in diameter with air-fluid level. No significant bowel wall thickening, inflammation, or distention of the colon. Moderate retained stool identified within the colon and rectum. PERITONEUM AND RETROPERITONEUM: Multifocal areas of mildly complex fluid identified within the bilateral abdomen and pelvis with signs of early loculation including mild thickening and enhancement of the overlying peritoneal reflections. The fluid measures 23 Hounsfield units along the right paracolic gutter. In the right hemipelvis, this measures 19 Hounsfield units. No free air. VASCULATURE: Vascular structures of the abdomen appear patent. Aorta is normal in caliber. LYMPH NODES: No lymphadenopathy. REPRODUCTIVE ORGANS: The uterus appears normal. Measures 1.7 cm, image 64/2. BONES AND SOFT TISSUES: No acute osseous abnormality. No focal soft tissue abnormality. IMPRESSION: 1. Multifocal mildly complex abdominopelvic fluid with early loculation and mild peritoneal thickening/enhancement. Differential diagnosis for the complex fluid includes peritonitis (infectious, inflammatory, chemical), malignancy (peritoneal carcinomatosis), pancreatitis, ruptured  ectopic pregnancy, or ruptured ovarian cyst. Given the Hounsfield units, the fluid is likely exudative or hemorrhagic. Clinical correlation is recommended. Consider diagnostic paracentesis for fluid analysis. 2. Few mildly prominent right abdominal small-bowel loops with air-fluid levels without a discrete transition point, favored mild ileus/enteritis rather than obstruction. 3. Moderate retained stool in the colon and rectum. Electronically signed by: Waddell Calk MD 10/22/2024 07:01 AM EST RP Workstation: [id number]   CT ABDOMEN PELVIS WO CONTRAST Result Date: 10/22/2024 EXAM: CT ABDOMEN AND PELVIS WITHOUT CONTRAST 10/22/2024 05:30:06 AM TECHNIQUE: CT of the abdomen and pelvis was performed without the administration of intravenous contrast. Multiplanar reformatted images are provided for review. Automated exposure control, iterative reconstruction, and/or weight-based adjustment of the mA/kV was utilized to reduce the radiation dose to as low as reasonably achievable. COMPARISON: 02/04/2023 CLINICAL HISTORY: Abdominal pain, acute (Ped 0-17y); Patient reports feeling of constipation, reports not passing any flatus or stool for 3 days. Pain over the mid to lower abdomen. FINDINGS: LOWER CHEST: No acute abnormality. LIVER: The liver is unremarkable. GALLBLADDER AND BILE DUCTS: Gallbladder is unremarkable. No biliary ductal dilatation. SPLEEN: No acute abnormality. PANCREAS: Pancreas divisum anatomy is suspected. ADRENAL GLANDS: No acute abnormality. KIDNEYS, URETERS AND BLADDER: Mild cortical volume loss from both kidneys. No nephrolithiasis or obstructive uropathy identified. Urinary bladder is decompressed. No perinephric or periureteral stranding. GI AND BOWEL: Assessment of bowel pathology is limited due to lack of intravenous and oral contrast material. No pathologic dilatation of the large or small bowel loops. Moderate stool burden noted within the colon. Normal appendix. PERITONEUM AND RETROPERITONEUM:  There is a small-to-moderate volume of complex fluid within the abdomen and pelvis which measures 33 Hounsfield units along the right lower quadrant. This measures 32 Hounsfield units along the left paracolic gutter. No free air. VASCULATURE: Aorta is normal in caliber. LYMPH NODES: No lymphadenopathy. REPRODUCTIVE ORGANS: The uterus appears normal. Suboptimal visualization of the adnexal structures, separate from the unopacified pelvic bowel loops and complex fluid. BONES AND SOFT TISSUES: Previous peritoneal dialysis catheter has been removed. No acute osseous abnormality. No focal soft tissue abnormality. IMPRESSION: 1. Small-to-moderate volume complex abdominopelvic free fluid. The complex nature of the fluid (Hounsfield units 32-33) suggests possibilities such as exudative ascites (due to inflammation, infection, or malignancy), hemorrhagic ascites (due to trauma, ruptured ectopic pregnancy, or other bleeding source), or chylous ascites. Consider further imaging such as a contrast-enhanced CT or MRI for better characterization of the fluid, evaluation of the peritoneum, and assessment for underlying pathology. Surgical consultation is recommended if there is high suspicion for  an acute surgical process. 2. No bowel obstruction. Moderate colonic stool burden, consistent with constipation. 3. Normal appendix. 4. These findings were discussed with the ordering provider at the time of interpretation on 10/22/2024. I personally spoke with Dr. Dicky, who acknowledged these findings. Electronically signed by: Waddell Calk MD 10/22/2024 06:19 AM EST RP Workstation: [id number]     Medications:    dextrose       Chlorhexidine  Gluconate Cloth  6 each Topical Q0600   feeding supplement (NEPRO CARB STEADY)  237 mL Oral TID BM   heparin   5,000 Units Subcutaneous Q12H   hydroxychloroquine   200 mg Oral Daily   lacosamide   200 mg Oral BID   levETIRAcetam   500 mg Oral Daily   loratadine   10 mg Oral Daily    acetaminophen , alteplase , heparin , HYDROcodone -acetaminophen , HYDROmorphone  (DILAUDID ) injection, lidocaine  (PF), lidocaine -prilocaine , ondansetron  **OR** ondansetron  (ZOFRAN ) IV, pentafluoroprop-tetrafluoroeth, polyethylene glycol, senna-docusate, zolpidem   Assessment/ Plan:  Ms. Melinda Steele is a 24 y.o.  female with hypertension, lupus, history of pericardial tamponade, congestive heart failure, seizures and end-stage renal disease on hemodialysis   End-stage renal disease with hyperkalemia on hemodialysis.  Last treatment completed on Saturday.  Potassium greater than 7.5 on ED arrival.   Received urgent dialysis with potassium corrected to only 5.2. Will order Lokelma  10g once. Next treatment scheduled for Thursday.   2.  Acute on chronic lower abdominal pain, CT abdomen pelvis showed mild complex abdominal pelvic fluid collection, infectious inflammatory chemical versus bleeding.  Awaiting surgical consult  3. Anemia of chronic kidney disease Lab Results  Component Value Date   HGB 10.5 (L) 10/22/2024    Hemoglobin stable, no need for ESA's.  4. Secondary Hyperparathyroidism: with outpatient labs: None  Lab Results  Component Value Date   PTH 181 (H) 08/23/2021   CALCIUM  8.7 (L) 10/22/2024   CALCIUM  8.6 (L) 10/22/2024   PHOS 5.0 (H) 10/22/2024    Calcium  and phos within desired range.    LOS: 1 Shadana Pry 1/28/20261:11 PM   "

## 2024-10-23 NOTE — Plan of Care (Signed)

## 2024-10-23 NOTE — Progress Notes (Signed)
 Pt receives outpt HD at [facility] on TTS at  [place]. Navigator following to assist with any HD needs.  Suzen Satchel Dialysis Navigator [phone]

## 2024-10-23 NOTE — Procedures (Signed)
 Interventional Radiology Procedure:   Indications: Ascites  Procedure: US  guided paracentesis  Findings: Removed 250 ml of dark yellow fluid from midline of pelvis  Complications: None     EBL: Less than 10 ml   Lorre Opdahl R. Philip, MD  Pager: [phone]

## 2024-10-23 NOTE — TOC Initial Note (Signed)
 Transition of Care [facility]) - Initial/Assessment Note    Patient Details  Name: Melinda Steele MRN: [id number] Date of Birth: 12/17/2000  [facility] CM/SW Contact:    Corean ONEIDA Haddock, RN Phone Number: 10/23/2024, 3:26 PM  Clinical Narrative:                  Met with patient and mother at bedside   Admitted for: abdominal pain Admitted from:Home with mother PCP: [facility] Pharmacy: [place], denies issues obtaining medications Current home health/prior home health/DME: NA  Patient states at baseline she is independent and able to drive herself to appointments.  Plan for mother to transport at discharge     Patient Goals and CMS Choice            Expected Discharge Plan and Services                                              Prior Living Arrangements/Services                       Activities of Daily Living   ADL Screening (condition at time of admission) Independently performs ADLs?: Yes (appropriate for developmental age) Is the patient deaf or have difficulty hearing?: No Does the patient have difficulty seeing, even when wearing glasses/contacts?: No Does the patient have difficulty concentrating, remembering, or making decisions?: No  Permission Sought/Granted                  Emotional Assessment              Admission diagnosis:  Hyperkalemia [E87.5] Wide-complex tachycardia [R00.0] Suprapubic pain [R10.24] Free fluid in pelvis [R18.8] Patient Active Problem List   Diagnosis Date Noted   Free fluid in pelvis 10/23/2024   Hyperkalemia 10/22/2024   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   Abdominal pain 02/05/2023   ESRD on peritoneal dialysis (HCC) 02/05/2023   Peritoneal dialysis-associated peritonitis 02/04/2023   ESRD on dialysis (HCC) 02/04/2023   Electrolyte abnormality 02/04/2023   Mood disorder 01/11/2023   Iron deficiency 08/21/2022   HFrEF (heart failure with reduced ejection fraction) (HCC) 12/16/2021    Moderate protein-calorie malnutrition 10/07/2021   Secondary hyperparathyroidism 09/06/2021   Multifocal pneumonia    Pericardial effusion    Sepsis (HCC) 08/21/2021   Acute renal failure superimposed on chronic kidney disease 08/21/2021   Chronic HFrEF (heart failure with reduced ejection fraction) (HCC) 08/21/2021   Hypertension 08/21/2021   Hyperlipidemia 08/21/2021   Seizures (HCC) 08/21/2021   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 [address] [place] - [address] [place] [address] Phone: [phone] Fax: [phone]     Social Drivers of Health (SDOH) Social History: SDOH Screenings   Food Insecurity: Patient Declined (10/22/2024)  Housing: Unknown (10/22/2024)  Transportation Needs: Patient Declined (10/22/2024)  Utilities: Patient Declined (  10/22/2024)  Financial Resource Strain: Low Risk (06/05/2024)   Received from [facility]  Physical Activity: Sufficiently Active (06/26/2024)   Received from [facility]  Social Connections: Moderately Integrated (06/26/2024)   Received from [facility]  Stress: No Stress Concern Present (06/26/2024)   Received from [facility]  Tobacco Use: Low Risk (10/22/2024)  Health Literacy: Low Risk (06/26/2024)   Received from [facility]   SDOH Interventions:     Readmission Risk Interventions    10/23/2024    3:26 PM 02/06/2023    1:38 PM   Readmission Risk Prevention Plan  Transportation Screening Complete Complete  PCP or Specialist Appt within 3-5 Days  Complete  Social Work Consult for Recovery Care Planning/Counseling  Complete  Palliative Care Screening  Not Applicable  Medication Review Oceanographer) Complete Complete  HRI or Home Care Consult --   Palliative Care Screening Not Applicable   Skilled Nursing Facility Not Applicable

## 2024-10-23 NOTE — Progress Notes (Signed)
 [facility]  - [place] [facility]   PATIENT NAME: Melinda Steele    MR#:  [id number]  DATE OF BIRTH:  2001/05/18  SUBJECTIVE:  seen earlier today. No family at bedside. Patient initially declined IR procedure for abdominal fluid aspirate. Dr. Tye explained the patient and she was agreeable.    VITALS:  Blood pressure (!) 126/93, pulse 95, temperature 98.6 F (37 C), temperature source Oral, resp. rate 17, height 5' 8 (1.727 m), weight 55.5 kg, SpO2 100%.  PHYSICAL EXAMINATION:   GENERAL:  24 y.o.-year-old patient with no acute distress.  LUNGS: Normal breath sounds bilaterally, no wheezing CARDIOVASCULAR: S1, S2 normal. No murmur   ABDOMEN: Soft, nontender, nondistended.  EXTREMITIES: No  edema b/l.   HD Access NEUROLOGIC: nonfocal  patient is alert and awake   LABORATORY PANEL:  CBC Recent Labs  Lab 10/22/24 2321  WBC 5.1  HGB 10.5*  HCT 35.2*  PLT 201    Chemistries  Recent Labs  Lab 10/22/24 0218 10/22/24 0338 10/22/24 2321  NA 133*   < > 135  133*  K >7.5*   < > 5.2*  5.1  CL 90*   < > 91*  91*  CO2 25   < > 30  28  GLUCOSE 97   < > 83  83  BUN 68*   < > 30*  30*  CREATININE 11.30*   < > 6.87*  6.86*  CALCIUM  8.9   < > 8.6*  8.7*  MG 2.6*  --   --   AST 19  --   --   ALT <5  --   --   ALKPHOS 157*  --   --   BILITOT 0.3  --   --    < > = values in this interval not displayed.   Cardiac Enzymes No results for input(s): TROPONINI in the last 168 hours. RADIOLOGY:  CT ABDOMEN PELVIS W CONTRAST Result Date: 10/22/2024 EXAM: CT ABDOMEN AND PELVIS WITH CONTRAST 10/22/2024 06:42:40 AM TECHNIQUE: CT of the abdomen and pelvis was performed with the administration of intravenous contrast. Multiplanar reformatted images are provided for review. Automated exposure control, iterative reconstruction, and/or weight-based adjustment of the mA/kV was utilized to reduce the radiation dose to as low as reasonably achievable. COMPARISON:  02/04/2023 CLINICAL HISTORY: Abdominal pain, acute, nonlocalized; CT imaging with contrast IV now (Dr. Joan, radiology aware). Abdominal pain, acute, nonlocalized. FINDINGS: LOWER CHEST: No acute abnormality. LIVER: The liver is unremarkable. GALLBLADDER AND BILE DUCTS: Gallbladder is unremarkable. No biliary ductal dilatation. SPLEEN: The spleen is within normal limits in size and appearance. PANCREAS: The pancreas is short. ADRENAL GLANDS: Normal size and morphology bilaterally. No nodule, thickening, or hemorrhage. No periadrenal stranding. KIDNEYS, URETERS AND BLADDER: Bilateral renal cortical volume loss, similar to previous exam. No stones in the kidneys or ureters. No hydronephrosis. No perinephric or periureteral stranding. Urinary bladder is decompressed. GI AND BOWEL: Stomach appears normal. Within the right hemiabdomen there are a few prominent loops of small bowel which are nonpathologically increased in caliber, measuring up to 2.1 cm in diameter with air-fluid level. No significant bowel wall thickening, inflammation, or distention of the colon. Moderate retained stool identified within the colon and rectum. PERITONEUM AND RETROPERITONEUM: Multifocal areas of mildly complex fluid identified within the bilateral abdomen and pelvis with signs of early loculation including mild thickening and enhancement of the overlying peritoneal reflections. The fluid measures 23 Hounsfield units along the right paracolic gutter. In the  right hemipelvis, this measures 19 Hounsfield units. No free air. VASCULATURE: Vascular structures of the abdomen appear patent. Aorta is normal in caliber. LYMPH NODES: No lymphadenopathy. REPRODUCTIVE ORGANS: The uterus appears normal. Measures 1.7 cm, image 64/2. BONES AND SOFT TISSUES: No acute osseous abnormality. No focal soft tissue abnormality. IMPRESSION: 1. Multifocal mildly complex abdominopelvic fluid with early loculation and mild peritoneal thickening/enhancement.  Differential diagnosis for the complex fluid includes peritonitis (infectious, inflammatory, chemical), malignancy (peritoneal carcinomatosis), pancreatitis, ruptured ectopic pregnancy, or ruptured ovarian cyst. Given the Hounsfield units, the fluid is likely exudative or hemorrhagic. Clinical correlation is recommended. Consider diagnostic paracentesis for fluid analysis. 2. Few mildly prominent right abdominal small-bowel loops with air-fluid levels without a discrete transition point, favored mild ileus/enteritis rather than obstruction. 3. Moderate retained stool in the colon and rectum. Electronically signed by: Waddell Calk MD 10/22/2024 07:01 AM EST RP Workstation: [id number]   CT ABDOMEN PELVIS WO CONTRAST Result Date: 10/22/2024 EXAM: CT ABDOMEN AND PELVIS WITHOUT CONTRAST 10/22/2024 05:30:06 AM TECHNIQUE: CT of the abdomen and pelvis was performed without the administration of intravenous contrast. Multiplanar reformatted images are provided for review. Automated exposure control, iterative reconstruction, and/or weight-based adjustment of the mA/kV was utilized to reduce the radiation dose to as low as reasonably achievable. COMPARISON: 02/04/2023 CLINICAL HISTORY: Abdominal pain, acute (Ped 0-17y); Patient reports feeling of constipation, reports not passing any flatus or stool for 3 days. Pain over the mid to lower abdomen. FINDINGS: LOWER CHEST: No acute abnormality. LIVER: The liver is unremarkable. GALLBLADDER AND BILE DUCTS: Gallbladder is unremarkable. No biliary ductal dilatation. SPLEEN: No acute abnormality. PANCREAS: Pancreas divisum anatomy is suspected. ADRENAL GLANDS: No acute abnormality. KIDNEYS, URETERS AND BLADDER: Mild cortical volume loss from both kidneys. No nephrolithiasis or obstructive uropathy identified. Urinary bladder is decompressed. No perinephric or periureteral stranding. GI AND BOWEL: Assessment of bowel pathology is limited due to lack of intravenous and oral  contrast material. No pathologic dilatation of the large or small bowel loops. Moderate stool burden noted within the colon. Normal appendix. PERITONEUM AND RETROPERITONEUM: There is a small-to-moderate volume of complex fluid within the abdomen and pelvis which measures 33 Hounsfield units along the right lower quadrant. This measures 32 Hounsfield units along the left paracolic gutter. No free air. VASCULATURE: Aorta is normal in caliber. LYMPH NODES: No lymphadenopathy. REPRODUCTIVE ORGANS: The uterus appears normal. Suboptimal visualization of the adnexal structures, separate from the unopacified pelvic bowel loops and complex fluid. BONES AND SOFT TISSUES: Previous peritoneal dialysis catheter has been removed. No acute osseous abnormality. No focal soft tissue abnormality. IMPRESSION: 1. Small-to-moderate volume complex abdominopelvic free fluid. The complex nature of the fluid (Hounsfield units 32-33) suggests possibilities such as exudative ascites (due to inflammation, infection, or malignancy), hemorrhagic ascites (due to trauma, ruptured ectopic pregnancy, or other bleeding source), or chylous ascites. Consider further imaging such as a contrast-enhanced CT or MRI for better characterization of the fluid, evaluation of the peritoneum, and assessment for underlying pathology. Surgical consultation is recommended if there is high suspicion for an acute surgical process. 2. No bowel obstruction. Moderate colonic stool burden, consistent with constipation. 3. Normal appendix. 4. These findings were discussed with the ordering provider at the time of interpretation on 10/22/2024. I personally spoke with Dr. Dicky, who acknowledged these findings. Electronically signed by: Waddell Calk MD 10/22/2024 06:19 AM EST RP Workstation: [id number]    Assessment and Plan   Timiyah Romito is a 24 y.o. 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  Patient reported that she has been having intermittent lower abdominal pain for few weeks, worsening in the last 3 days.  She attributed her symptoms to intermittent constipation, she used MiraLAX  with little relief.  She denies any nauseous vomiting no fever or chills.  Last dialysis was Saturday.  ED Course: Afebrile, nontachycardic blood pressure 130/90 O2 saturation 94% on room air.  Blood work showed WBC 7.3 hemoglobin 11.7 from the previous 11.0-12.0, BUN 68 creatinine 11.3 K> 7.5, bicarb 25 lipase 244.  EKG showed tented T waves on multiple leads.   Patient was given hyperkalemia cocktail calcium  gluconate, insulin  and dextrose  and Lokelma , patient is currently in the emergency dialysis.  Abdominal pain complex abdominal pelvic fluid collection history of constipation -- Gen. surgery consultation with Dr. Tye appreciated. No surgical intervention needed. Surgery spoke with patient and explained need for abdominal fluid aspirate. -- Status post aspiration of about 250 mL of fluid from complex abdominal fluid collection. Follow-up fluid labs. 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  Reasoning as above.   Probably hypercoagulable state SLE -Had a significant bleeding event during heparin  therapy, later [facility] hematology cleared the patient for long-term anticoagulation   Chronic HFrEF with recovered LVEF - Fluid overload, - HD this morning --pt is not taking her cardiac meds (per med rec)   Seizure disorder - No acute concerns, continue antiseizure regimen  Procedures: Family communication : none today Consults : general surgery, nephrology CODE  STATUS: full DVT Prophylaxis : subcu heparin  Level of care: Telemetry Status is: Inpatient Remains inpatient appropriate because: complex abdominal fluid evaluation, hyperkalemia    TOTAL TIME TAKING CARE OF THIS PATIENT: 35 minutes.  >50% time spent on counselling and coordination of care  Note: This dictation was prepared with Dragon dictation along with smaller phrase technology. Any transcriptional errors that result from this process are unintentional.  Leita Blanch M.D    [facility]   CC: Primary care physician; [facility]

## 2024-10-24 LAB — CBC
HCT: 34.3 % — ABNORMAL LOW (ref 36.0–46.0)
Hemoglobin: 10.2 g/dL — ABNORMAL LOW (ref 12.0–15.0)
MCH: 24.2 pg — ABNORMAL LOW (ref 26.0–34.0)
MCHC: 29.7 g/dL — ABNORMAL LOW (ref 30.0–36.0)
MCV: 81.5 fL (ref 80.0–100.0)
Platelets: 222 10*3/uL (ref 150–400)
RBC: 4.21 MIL/uL (ref 3.87–5.11)
RDW: 19.2 % — ABNORMAL HIGH (ref 11.5–15.5)
WBC: 4.8 10*3/uL (ref 4.0–10.5)
nRBC: 0 % (ref 0.0–0.2)

## 2024-10-24 LAB — RENAL FUNCTION PANEL
Albumin: 3.6 g/dL (ref 3.5–5.0)
Anion gap: 15 (ref 5–15)
BUN: 54 mg/dL — ABNORMAL HIGH (ref 6–20)
CO2: 28 mmol/L (ref 22–32)
Calcium: 7.9 mg/dL — ABNORMAL LOW (ref 8.9–10.3)
Chloride: 92 mmol/L — ABNORMAL LOW (ref 98–111)
Creatinine, Ser: 10.8 mg/dL — ABNORMAL HIGH (ref 0.44–1.00)
GFR, Estimated: 5 mL/min — ABNORMAL LOW
Glucose, Bld: 78 mg/dL (ref 70–99)
Phosphorus: 5.8 mg/dL — ABNORMAL HIGH (ref 2.5–4.6)
Potassium: 5.4 mmol/L — ABNORMAL HIGH (ref 3.5–5.1)
Sodium: 135 mmol/L (ref 135–145)

## 2024-10-24 LAB — MISC LABCORP TEST (SEND OUT)
Labcorp test code: 93935
Labcorp test code: 100156
Labcorp test code: 19588
Labcorp test code: 315780

## 2024-10-24 LAB — HIV ANTIBODY (ROUTINE TESTING W REFLEX): HIV Screen 4th Generation wRfx: NONREACTIVE

## 2024-10-24 LAB — HEPATITIS B SURFACE ANTIBODY, QUANTITATIVE: Hep B S AB Quant (Post): 17.9 m[IU]/mL

## 2024-10-24 MED ORDER — HEPARIN SODIUM (PORCINE) 1000 UNIT/ML IJ SOLN
INTRAMUSCULAR | Status: AC
  Filled 2024-10-24: qty 3

## 2024-10-24 MED ORDER — SODIUM ZIRCONIUM CYCLOSILICATE 10 G PO PACK: 10.0000 g | PACK | ORAL | Status: DC

## 2024-10-24 MED ORDER — NEPRO/CARBSTEADY PO LIQD: 237.0000 mL | Freq: Three times a day (TID) | ORAL | 2 refills | Status: AC

## 2024-10-24 MED ORDER — SODIUM ZIRCONIUM CYCLOSILICATE 10 G PO PACK: 10.0000 g | PACK | ORAL | 0 refills | Status: AC

## 2024-10-24 NOTE — Plan of Care (Signed)

## 2024-10-24 NOTE — Discharge Summary (Signed)
 " Physician Discharge Summary   Patient: Melinda Steele MRN: [id number] DOB: 11-21-2000  Admit date:     10/22/2024  Discharge date: 10/24/24  Discharge Physician: Amaryllis Dare   [facility]: [facility]   Recommendations at discharge:  Please obtain CBC and BMP and follow-up Patient was given Lokelma  to use on nondialysis days Continue scheduled dialysis Continue education on renal, low potassium diet Follow-up with primary care provider  Discharge Diagnoses: Principal Problem:   Hyperkalemia Active Problems:   ESRD on dialysis [facility])   Abdominal pain   Free fluid in pelvis   Hospital Course: Melinda Steele is a 24 y.o. 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  Patient reported that she has been having intermittent lower abdominal pain for few weeks, worsening in the last 3 days.  On presentation vital stable, labs with hyperkalemia with potassium at 7.5, EKG with tented T waves on multiple leads. Patient was given hyperkalemia cocktail including calcium  gluconate, insulin  and dextrose  and Lokelma .  She was taken for emergent dialysis.  Potassium improved.  Apparently she was not following strict renal, low potassium diet.  Drinking Ensure and boost.  Patient was instructed to avoid potassium rich food and supplements.  She was given new prescription for Nepro if needed for dietary supplements.  She received dialysis before the day of discharge.  She was also given Lokelma  to use on nondialysis days as she has an history of intermittent hyperkalemia.  Her CT abdomen and pelvis with a small loculated pelvic fluid collection for which general surgery was consulted, s/p fluid was aspirated by IR.  Cultures remain negative.  Might be a ruptured ovarian cyst which is common for this age group.  Patient was advised to follow-up with her  gynecologist for further assistance.  Patient has an history of HFrEF with improved EF.  She was not taking any of her cardiac meds and will follow-up with her cardiologist.  Patient also has an history of left atrium thrombosis and will continue Eliquis on discharge.  Patient also has an history of seizure disorder, no acute concern and she will continue home medications.  Patient will continue on current medications and need to have a close follow-up with her providers for further assistance   Consultants: Nephrology Procedures performed: Hemodialysis Disposition: Home Diet recommendation:  Renal diet DISCHARGE MEDICATION: Allergies as of 10/24/2024       Reactions   Amlodipine Swelling        Medication List     STOP taking these medications    Benlysta  200 MG/ML Soaj Generic drug: Belimumab    carvedilol  25 MG tablet Commonly known as: COREG    cloNIDine  0.3 mg/24hr patch Commonly known as: CATAPRES  - Dosed in mg/24 hr   isosorbide -hydrALAZINE  20-37.5 MG tablet Commonly known as: BIDIL    losartan 50 MG tablet Commonly known as: COZAAR   methylPREDNISolone  sodium succinate 125 mg/2 mL injection Commonly known as: SOLU-MEDROL    pravastatin  20 MG tablet Commonly known as: PRAVACHOL    sacubitril -valsartan  24-26 MG Commonly known as: ENTRESTO    spironolactone  25 MG tablet Commonly known as: ALDACTONE    torsemide 20 MG tablet Commonly known as: DEMADEX       TAKE these medications    acetaminophen  500 MG tablet Commonly known as: TYLENOL  Take 2 tablets (1,000 mg total) by mouth every 6 (six) hours as needed.   apixaban 2.5 MG Tabs  tablet Commonly known as: ELIQUIS Take 2.5 mg by mouth 2 (two) times daily.   calcitRIOL  0.5 MCG capsule Commonly known as: ROCALTROL  Take 0.5 mcg by mouth daily.   calcium  acetate 667 MG capsule Commonly known as: PHOSLO  Take 667 mg by mouth 3 (three) times daily with meals.   cetirizine 5 MG tablet Commonly  known as: ZYRTEC Take 5 mg by mouth daily.   feeding supplement (NEPRO CARB STEADY) Liqd Take 237 mLs by mouth 3 (three) times daily between meals.   hydroxychloroquine  200 MG tablet Commonly known as: PLAQUENIL  Take 200 mg by mouth daily.   hydrOXYzine  25 MG tablet Commonly known as: ATARAX  Take 25 mg by mouth 2 (two) times daily as needed for anxiety.   lacosamide  200 MG Tabs tablet Commonly known as: VIMPAT  Take 200 mg by mouth 2 (two) times daily.   levETIRAcetam  500 MG tablet Commonly known as: KEPPRA  Take 500 mg by mouth daily.   multivitamin Tabs tablet Take 1 tablet by mouth at bedtime.   Mycophenolic Acid 360 MG Tbec Take 1 tablet by mouth 2 (two) times daily.   pantoprazole  40 MG tablet Commonly known as: PROTONIX  Take 1 tablet (40 mg total) by mouth daily.   polyethylene glycol 17 g packet Commonly known as: MIRALAX  / GLYCOLAX  Take 17 g by mouth daily as needed for moderate constipation.   promethazine  12.5 MG tablet Commonly known as: PHENERGAN  Take 12.5 mg by mouth every 6 (six) hours as needed for nausea or vomiting.   sodium zirconium cyclosilicate  10 g Pack packet Commonly known as: LOKELMA  Take 10 g by mouth every Monday, Wednesday, and Friday. Start taking on: October 25, 2024   Vitamin D3 10 MCG (400 UNIT) tablet Take 400 Units by mouth daily.        Discharge Exam: Filed Weights   10/22/24 0730 10/22/24 1140 10/24/24 1250  Weight: 56 kg 55.5 kg 54.3 kg   General.  Underweight young lady, in no acute distress. Pulmonary.  Lungs clear bilaterally, normal respiratory effort. CV.  Regular rate and rhythm, no JVD, rub or murmur. Abdomen.  Soft, nontender, nondistended, BS positive. CNS.  Alert and oriented .  No focal neurologic deficit. Extremities.  No edema,  pulses intact and symmetrical. Psychiatry.  Judgment and insight appears normal.   Condition at discharge: stable  The results of significant diagnostics from this  hospitalization (including imaging, microbiology, ancillary and laboratory) are listed below for reference.   Imaging Studies: US  Paracentesis Result Date: 10/23/2024 INDICATION: 24 year old with end-stage renal disease and abdominal pain. Recent CT demonstrates abdominopelvic fluid collections. Request for fluid sampling. EXAM: ULTRASOUND GUIDED PARACENTESIS MEDICATIONS: None. COMPLICATIONS: None immediate. PROCEDURE: Informed written consent was obtained from the patient after a discussion of the risks, benefits and alternatives to treatment. A timeout was performed prior to the initiation of the procedure. Initial ultrasound scanning demonstrates a small to moderate amount of fluid in the midline of the pelvis. The suprapubic region was prepped with chlorhexidine  and sterile field was created. 1% lidocaine  was used for local anesthesia. Following this, a One-Step Centesis catheter was introduced. An ultrasound image was saved for documentation purposes. The paracentesis was performed. The catheter was removed and a dressing was applied. The patient tolerated the procedure well without immediate post procedural complication. Patient received post-procedure intravenous albumin; see nursing notes for details. FINDINGS: Small amount of fluid in the right lower quadrant and left lower quadrant. Concern for peritoneal thickening in the right lower quadrant. Small to  moderate amount of fluid in the midline of the pelvis. 250 mL of dark yellow fluid was removed from the pelvis. No significant ascites remaining after the paracentesis. IMPRESSION: Successful ultrasound-guided paracentesis yielding 250 mL of peritoneal fluid. Electronically Signed   By: Juliene Balder M.D.   On: 10/23/2024 15:54   CT ABDOMEN PELVIS W CONTRAST Result Date: 10/22/2024 EXAM: CT ABDOMEN AND PELVIS WITH CONTRAST 10/22/2024 06:42:40 AM TECHNIQUE: CT of the abdomen and pelvis was performed with the administration of intravenous contrast.  Multiplanar reformatted images are provided for review. Automated exposure control, iterative reconstruction, and/or weight-based adjustment of the mA/kV was utilized to reduce the radiation dose to as low as reasonably achievable. COMPARISON: 02/04/2023 CLINICAL HISTORY: Abdominal pain, acute, nonlocalized; CT imaging with contrast IV now (Dr. Joan, radiology aware). Abdominal pain, acute, nonlocalized. FINDINGS: LOWER CHEST: No acute abnormality. LIVER: The liver is unremarkable. GALLBLADDER AND BILE DUCTS: Gallbladder is unremarkable. No biliary ductal dilatation. SPLEEN: The spleen is within normal limits in size and appearance. PANCREAS: The pancreas is short. ADRENAL GLANDS: Normal size and morphology bilaterally. No nodule, thickening, or hemorrhage. No periadrenal stranding. KIDNEYS, URETERS AND BLADDER: Bilateral renal cortical volume loss, similar to previous exam. No stones in the kidneys or ureters. No hydronephrosis. No perinephric or periureteral stranding. Urinary bladder is decompressed. GI AND BOWEL: Stomach appears normal. Within the right hemiabdomen there are a few prominent loops of small bowel which are nonpathologically increased in caliber, measuring up to 2.1 cm in diameter with air-fluid level. No significant bowel wall thickening, inflammation, or distention of the colon. Moderate retained stool identified within the colon and rectum. PERITONEUM AND RETROPERITONEUM: Multifocal areas of mildly complex fluid identified within the bilateral abdomen and pelvis with signs of early loculation including mild thickening and enhancement of the overlying peritoneal reflections. The fluid measures 23 Hounsfield units along the right paracolic gutter. In the right hemipelvis, this measures 19 Hounsfield units. No free air. VASCULATURE: Vascular structures of the abdomen appear patent. Aorta is normal in caliber. LYMPH NODES: No lymphadenopathy. REPRODUCTIVE ORGANS: The uterus appears normal.  Measures 1.7 cm, image 64/2. BONES AND SOFT TISSUES: No acute osseous abnormality. No focal soft tissue abnormality. IMPRESSION: 1. Multifocal mildly complex abdominopelvic fluid with early loculation and mild peritoneal thickening/enhancement. Differential diagnosis for the complex fluid includes peritonitis (infectious, inflammatory, chemical), malignancy (peritoneal carcinomatosis), pancreatitis, ruptured ectopic pregnancy, or ruptured ovarian cyst. Given the Hounsfield units, the fluid is likely exudative or hemorrhagic. Clinical correlation is recommended. Consider diagnostic paracentesis for fluid analysis. 2. Few mildly prominent right abdominal small-bowel loops with air-fluid levels without a discrete transition point, favored mild ileus/enteritis rather than obstruction. 3. Moderate retained stool in the colon and rectum. Electronically signed by: Waddell Joan MD 10/22/2024 07:01 AM EST RP Workstation: [id number]   CT ABDOMEN PELVIS WO CONTRAST Result Date: 10/22/2024 EXAM: CT ABDOMEN AND PELVIS WITHOUT CONTRAST 10/22/2024 05:30:06 AM TECHNIQUE: CT of the abdomen and pelvis was performed without the administration of intravenous contrast. Multiplanar reformatted images are provided for review. Automated exposure control, iterative reconstruction, and/or weight-based adjustment of the mA/kV was utilized to reduce the radiation dose to as low as reasonably achievable. COMPARISON: 02/04/2023 CLINICAL HISTORY: Abdominal pain, acute (Ped 0-17y); Patient reports feeling of constipation, reports not passing any flatus or stool for 3 days. Pain over the mid to lower abdomen. FINDINGS: LOWER CHEST: No acute abnormality. LIVER: The liver is unremarkable. GALLBLADDER AND BILE DUCTS: Gallbladder is unremarkable. No biliary ductal dilatation. SPLEEN: No acute abnormality.  PANCREAS: Pancreas divisum anatomy is suspected. ADRENAL GLANDS: No acute abnormality. KIDNEYS, URETERS AND BLADDER: Mild cortical volume loss  from both kidneys. No nephrolithiasis or obstructive uropathy identified. Urinary bladder is decompressed. No perinephric or periureteral stranding. GI AND BOWEL: Assessment of bowel pathology is limited due to lack of intravenous and oral contrast material. No pathologic dilatation of the large or small bowel loops. Moderate stool burden noted within the colon. Normal appendix. PERITONEUM AND RETROPERITONEUM: There is a small-to-moderate volume of complex fluid within the abdomen and pelvis which measures 33 Hounsfield units along the right lower quadrant. This measures 32 Hounsfield units along the left paracolic gutter. No free air. VASCULATURE: Aorta is normal in caliber. LYMPH NODES: No lymphadenopathy. REPRODUCTIVE ORGANS: The uterus appears normal. Suboptimal visualization of the adnexal structures, separate from the unopacified pelvic bowel loops and complex fluid. BONES AND SOFT TISSUES: Previous peritoneal dialysis catheter has been removed. No acute osseous abnormality. No focal soft tissue abnormality. IMPRESSION: 1. Small-to-moderate volume complex abdominopelvic free fluid. The complex nature of the fluid (Hounsfield units 32-33) suggests possibilities such as exudative ascites (due to inflammation, infection, or malignancy), hemorrhagic ascites (due to trauma, ruptured ectopic pregnancy, or other bleeding source), or chylous ascites. Consider further imaging such as a contrast-enhanced CT or MRI for better characterization of the fluid, evaluation of the peritoneum, and assessment for underlying pathology. Surgical consultation is recommended if there is high suspicion for an acute surgical process. 2. No bowel obstruction. Moderate colonic stool burden, consistent with constipation. 3. Normal appendix. 4. These findings were discussed with the ordering provider at the time of interpretation on 10/22/2024. I personally spoke with Dr. Dicky, who acknowledged these findings. Electronically signed by:  Waddell Calk MD 10/22/2024 06:19 AM EST RP Workstation: [id number]    Microbiology: Results for orders placed or performed during the hospital encounter of 10/22/24  Aerobic/Anaerobic Culture w Gram Stain (surgical/deep wound)     Status: None (Preliminary result)   Collection Time: 10/23/24  2:51 PM   Specimen: PATH Cytology Peritoneal fluid  Result Value Ref Range Status   Specimen Description   Final    PERITONEAL Performed at [facility], [address], [place], [place] [address]    Special Requests   Final    PATH CYTO Performed at [facility], [address] [place], [place] [address]    Gram Stain   Final    FEW WBC PRESENT,BOTH PMN AND MONONUCLEAR NO ORGANISMS SEEN    Culture   Final    NO GROWTH < 12 HOURS Performed at [facility], [address], [place], [place] [address]    Report Status PENDING  Incomplete  Body fluid culture w Gram Stain     Status: None (Preliminary result)   Collection Time: 10/23/24  2:51 PM   Specimen: PATH Cytology Peritoneal fluid  Result Value Ref Range Status   Specimen Description   Final    PERITONEAL Performed at [facility], [address], [place], [place] [address]    Special Requests   Final    PATHCYTO Performed at [facility], [address] [place], [place] [address]    Gram Stain   Final    FEW WBC PRESENT, PREDOMINANTLY PMN NO ORGANISMS SEEN    Culture   Final    NO GROWTH < 12 HOURS Performed at [facility], [address] [place], [place] [address]    Report Status PENDING  Incomplete  Labs: CBC: Recent Labs  Lab 10/22/24 0218 10/22/24 0604 10/22/24 2321  WBC 7.3 7.0 5.1  HGB 11.7* 10.4* 10.5*  HCT 38.4 34.3* 35.2*  MCV 80.0 80.9 81.3  PLT 232 214 201   Basic Metabolic Panel: Recent Labs  Lab 10/22/24 0218 10/22/24 0338 10/22/24 0557 10/22/24 2321  NA 133* 133*  --  135  133*  K >7.5* >7.5* 5.4* 5.2*  5.1  CL 90* 90*  --  91*   91*  CO2 25 25  --  30  28  GLUCOSE 97 88  --  83  83  BUN 68* 69*  --  30*  30*  CREATININE 11.30* 11.30*  --  6.87*  6.86*  CALCIUM  8.9 8.8*  --  8.6*  8.7*  MG 2.6*  --   --   --   PHOS  --   --   --  5.0*   Liver Function Tests: Recent Labs  Lab 10/22/24 0218 10/22/24 2321  AST 19  --   ALT <5  --   ALKPHOS 157*  --   BILITOT 0.3  --   PROT 8.5*  --   ALBUMIN 4.0 3.8   CBG: Recent Labs  Lab 10/22/24 0456 10/22/24 0516  GLUCAP 26* 166*    Discharge time spent: greater than 30 minutes.  This record has been created using Conservation officer, historic buildings. Errors have been sought and corrected,but may not always be located. Such creation errors do not reflect on the standard of care.   Signed: Amaryllis Dare, MD [facility] 10/24/2024 "

## 2024-10-24 NOTE — Progress Notes (Signed)
 " [facility]  ROUNDING NOTE   Subjective:   Melinda Steele is a 24 y.o. female with with hypertension, lupus, history of pericardial tamponade, congestive heart failure, seizures and end-stage renal disease on hemodialysis. Patient presents to ED with abd pain and has been admitted for Hyperkalemia [E87.5] Wide-complex tachycardia [R00.0] Suprapubic pain [R10.24] Free fluid in pelvis [R18.8]  Patient is known to our practice and receives outpatient dialysis treatments at [facility] on a TTS schedule, supervised by Dr. Tobie.    Update: Patient sitting up in bed Tolerating meals Paracentesis on 1/28, achieved.  Scheduled for dialysis later today   Objective:  Vital signs in last 24 hours:  Temp:  [97.8 F (36.6 C)-98.4 F (36.9 C)] 98.4 F (36.9 C) (01/29 1247) Pulse Rate:  [83-99] 88 (01/29 1330) Resp:  [12-18] 14 (01/29 1330) BP: (116-129)/(84-96) 127/96 (01/29 1330) SpO2:  [99 %-100 %] 99 % (01/29 1330) Weight:  [54.3 kg] 54.3 kg (01/29 1250)  Weight change:  Filed Weights   10/22/24 0730 10/22/24 1140 10/24/24 1250  Weight: 56 kg 55.5 kg 54.3 kg    Intake/Output: No intake/output data recorded.   Intake/Output this shift:  No intake/output data recorded.  Physical Exam: General: NAD  Head: Normocephalic, atraumatic. Moist oral mucosal membranes  Eyes: Anicteric  Lungs:  Clear to auscultation, normal effort  Heart: Regular rate and rhythm  Abdomen:  Soft, nontender  Extremities: No peripheral edema.  Neurologic: Awake, alert, conversant  Skin: Warm,dry, no rash  Access: Right IJ PermCath    Basic Metabolic Panel: Recent Labs  Lab 10/22/24 0218 10/22/24 0338 10/22/24 0557 10/22/24 2321  NA 133* 133*  --  135  133*  K >7.5* >7.5* 5.4* 5.2*  5.1  CL 90* 90*  --  91*  91*  CO2 25 25  --  30  28  GLUCOSE 97 88  --  83  83  BUN 68* 69*  --  30*  30*  CREATININE 11.30* 11.30*  --  6.87*  6.86*  CALCIUM  8.9 8.8*  --   8.6*  8.7*  MG 2.6*  --   --   --   PHOS  --   --   --  5.0*    Liver Function Tests: Recent Labs  Lab 10/22/24 0218 10/22/24 2321  AST 19  --   ALT <5  --   ALKPHOS 157*  --   BILITOT 0.3  --   PROT 8.5*  --   ALBUMIN 4.0 3.8   Recent Labs  Lab 10/22/24 0218 10/22/24 2321  LIPASE 244* 57*   No results for input(s): AMMONIA in the last 168 hours.  CBC: Recent Labs  Lab 10/22/24 0218 10/22/24 0604 10/22/24 2321 10/24/24 1328  WBC 7.3 7.0 5.1 4.8  HGB 11.7* 10.4* 10.5* 10.2*  HCT 38.4 34.3* 35.2* 34.3*  MCV 80.0 80.9 81.3 81.5  PLT 232 214 201 222    Cardiac Enzymes: No results for input(s): CKTOTAL, CKMB, CKMBINDEX, TROPONINI in the last 168 hours.  BNP: Invalid input(s): POCBNP  CBG: Recent Labs  Lab 10/22/24 0456 10/22/24 0516  GLUCAP 26* 166*    Microbiology: Results for orders placed or performed during the hospital encounter of 10/22/24  Aerobic/Anaerobic Culture w Gram Stain (surgical/deep wound)     Status: None (Preliminary result)   Collection Time: 10/23/24  2:51 PM   Specimen: PATH Cytology Peritoneal fluid  Result Value Ref Range Status   Specimen Description   Final  PERITONEAL Performed at [facility], [address] [place], [place] [address]    Special Requests   Final    PATH CYTO Performed at [facility], [address] [place], [place] [address]    Gram Stain   Final    FEW WBC PRESENT,BOTH PMN AND MONONUCLEAR NO ORGANISMS SEEN    Culture   Final    NO GROWTH < 12 HOURS Performed at [facility], [address] [place], [place] [address]    Report Status PENDING  Incomplete  Body fluid culture w Gram Stain     Status: None (Preliminary result)   Collection Time: 10/23/24  2:51 PM   Specimen: PATH Cytology Peritoneal fluid  Result Value Ref Range Status   Specimen Description   Final    PERITONEAL Performed at [facility], [address], [place], [place]  [address]    Special Requests   Final    PATHCYTO Performed at [facility], [address] [place], [place] [address]    Gram Stain   Final    FEW WBC PRESENT, PREDOMINANTLY PMN NO ORGANISMS SEEN    Culture   Final    NO GROWTH < 12 HOURS Performed at [facility], [address], [place], [place] [address]    Report Status PENDING  Incomplete    Coagulation Studies: Recent Labs    10/22/24 0604  LABPROT 14.8  INR 1.1    Urinalysis: No results for input(s): COLORURINE, LABSPEC, PHURINE, GLUCOSEU, HGBUR, BILIRUBINUR, KETONESUR, PROTEINUR, UROBILINOGEN, NITRITE, LEUKOCYTESUR in the last 72 hours.  Invalid input(s): APPERANCEUR    Imaging: US  Paracentesis Result Date: 10/23/2024 INDICATION: 24 year old with end-stage renal disease and abdominal pain. Recent CT demonstrates abdominopelvic fluid collections. Request for fluid sampling. EXAM: ULTRASOUND GUIDED PARACENTESIS MEDICATIONS: None. COMPLICATIONS: None immediate. PROCEDURE: Informed written consent was obtained from the patient after a discussion of the risks, benefits and alternatives to treatment. A timeout was performed prior to the initiation of the procedure. Initial ultrasound scanning demonstrates a small to moderate amount of fluid in the midline of the pelvis. The suprapubic region was prepped with chlorhexidine  and sterile field was created. 1% lidocaine  was used for local anesthesia. Following this, a One-Step Centesis catheter was introduced. An ultrasound image was saved for documentation purposes. The paracentesis was performed. The catheter was removed and a dressing was applied. The patient tolerated the procedure well without immediate post procedural complication. Patient received post-procedure intravenous albumin; see nursing notes for details. FINDINGS: Small amount of fluid in the right lower quadrant and left lower quadrant. Concern for peritoneal thickening in the right  lower quadrant. Small to moderate amount of fluid in the midline of the pelvis. 250 mL of dark yellow fluid was removed from the pelvis. No significant ascites remaining after the paracentesis. IMPRESSION: Successful ultrasound-guided paracentesis yielding 250 mL of peritoneal fluid. Electronically Signed   By: Juliene Balder M.D.   On: 10/23/2024 15:54     Medications:    dextrose       Chlorhexidine  Gluconate Cloth  6 each Topical Q0600   feeding supplement (NEPRO CARB STEADY)  237 mL Oral TID BM   heparin   5,000 Units Subcutaneous Q12H   hydroxychloroquine   200 mg Oral Daily   lacosamide   200 mg Oral BID   levETIRAcetam   500 mg Oral Daily   loratadine   10 mg Oral Daily   [START ON 10/25/2024] sodium zirconium cyclosilicate   10 g Oral Q M,W,F  acetaminophen , alteplase , diphenhydrAMINE , heparin , HYDROcodone -acetaminophen , HYDROmorphone  (DILAUDID ) injection, lidocaine  (PF), lidocaine -prilocaine , ondansetron  **OR** ondansetron  (ZOFRAN ) IV, pentafluoroprop-tetrafluoroeth, polyethylene glycol, senna-docusate, zolpidem   Assessment/ Plan:  Ms. Melinda Steele is a 24 y.o.  female with hypertension, lupus, history of pericardial tamponade, congestive heart failure, seizures and end-stage renal disease on hemodialysis   End-stage renal disease with hyperkalemia on hemodialysis.  Potassium greater than 7.5 on ED arrival.   Scheduled for dialysis today. Potassium corrected, 5.2. Will continue to correct with dialysis. Next treatment scheduled for Saturday.   2.  Acute on chronic lower abdominal pain, CT abdomen pelvis showed mild complex abdominal pelvic fluid collection, infectious inflammatory chemical versus bleeding.   General surgery consulted and no surgical intervention. IR performed paracentesis with fluid collection.   3. Anemia of chronic kidney disease Lab Results  Component Value Date   HGB 10.2 (L) 10/24/2024   Will continue to monitor anemia panel.  4. Secondary  Hyperparathyroidism: with outpatient labs: None  Lab Results  Component Value Date   PTH 181 (H) 08/23/2021   CALCIUM  8.7 (L) 10/22/2024   CALCIUM  8.6 (L) 10/22/2024   PHOS 5.0 (H) 10/22/2024    Bone minerals within desired target.    LOS: 2 Menaal Russum 1/29/20262:03 PM   "

## 2024-10-24 NOTE — Progress Notes (Addendum)
" °   10/24/24 1634  Vitals  Temp 98.2 F (36.8 C)  Temp Source Oral  BP 103/79  MAP (mmHg) 88  BP Location Left Arm  BP Method Automatic  Patient Position (if appropriate) Lying  Pulse Rate (!) 102  ECG Heart Rate (!) 101  Resp 12  Oxygen Therapy  SpO2 99 %  O2 Device Room Air  Patient Activity (if Appropriate) In bed  Pulse Oximetry Type Continuous  Oximetry Probe Site Changed No  During Treatment Monitoring  Blood Flow Rate (mL/min) 199 mL/min  Arterial Pressure (mmHg) -499.98 mmHg  Venous Pressure (mmHg) 1.41 mmHg  TMP (mmHg) 17.77 mmHg  Ultrafiltration Rate (mL/min) 1045 mL/min  Dialysate Flow Rate (mL/min) 300 ml/min  Dialysate Potassium Concentration 2  Dialysate Calcium  Concentration 2.5  Duration of HD Treatment -hour(s) 3 hour(s)  Cumulative Fluid Removed (mL) per Treatment  2000.21  HD Safety Checks Performed Yes  Intra-Hemodialysis Comments Tx completed  Post Treatment  Dialyzer Clearance Heavily streaked  Hemodialysis Intake (mL) 0 mL  Liters Processed 72  Fluid Removed (mL) 2000 mL  Tolerated HD Treatment Yes  Hemodialysis Catheter Right Subclavian  No placement date or time found.   Orientation: Right  Access Location: Subclavian  Site Condition No complications  Blue Lumen Status Flushed;Heparin  locked;Dead end cap in place  Red Lumen Status Flushed;Heparin  locked;Dead end cap in place  Purple Lumen Status N/A  Catheter fill solution Heparin  1000 units/ml  Catheter fill volume (Arterial) 1.6 cc  Catheter fill volume (Venous) 1.6  Dressing Type Transparent  Dressing Status Antimicrobial disc/dressing in place;Clean, Dry, Intact  Drainage Description None  Dressing Change Due 10/30/24  Post treatment catheter status Capped and Clamped   Pt tolerated tx well. Removed 2 L off. Pt refused to secure dressing. Stated just leave it. Vss. Report given to floor nurse. "

## 2024-10-27 LAB — BODY FLUID CULTURE W GRAM STAIN: Culture: NO GROWTH

## 2024-10-28 LAB — AEROBIC/ANAEROBIC CULTURE W GRAM STAIN (SURGICAL/DEEP WOUND): Culture: NO GROWTH
